# Patient Record
Sex: Female | Born: 1995
Health system: Southern US, Community
[De-identification: ages and names within clinical notes are randomized; demographics above are authoritative.]

## PROBLEM LIST (undated history)

## (undated) ENCOUNTER — Inpatient Hospital Stay (HOSPITAL_COMMUNITY): Payer: Self-pay

## (undated) DIAGNOSIS — U071 COVID-19: Secondary | ICD-10-CM

## (undated) DIAGNOSIS — L309 Dermatitis, unspecified: Secondary | ICD-10-CM

## (undated) DIAGNOSIS — G43909 Migraine, unspecified, not intractable, without status migrainosus: Secondary | ICD-10-CM

## (undated) HISTORY — DX: Dermatitis, unspecified: L30.9

## (undated) HISTORY — PX: NO PAST SURGERIES: SHX2092

---
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
## 2016-10-22 ENCOUNTER — Encounter (HOSPITAL_BASED_OUTPATIENT_CLINIC_OR_DEPARTMENT_OTHER): Payer: Self-pay

## 2016-10-22 ENCOUNTER — Emergency Department (HOSPITAL_BASED_OUTPATIENT_CLINIC_OR_DEPARTMENT_OTHER)
Admission: EM | Admit: 2016-10-22 | Discharge: 2016-10-22 | Disposition: A | Discharge status: Home / Self Care | Payer: 59 | Attending: Emergency Medicine | Admitting: Emergency Medicine

## 2016-10-22 ENCOUNTER — Emergency Department (HOSPITAL_BASED_OUTPATIENT_CLINIC_OR_DEPARTMENT_OTHER): Payer: 59

## 2016-10-22 DIAGNOSIS — F17200 Nicotine dependence, unspecified, uncomplicated: Secondary | ICD-10-CM | POA: Diagnosis not present

## 2016-10-22 DIAGNOSIS — R519 Headache, unspecified: Secondary | ICD-10-CM

## 2016-10-22 DIAGNOSIS — R51 Headache: Secondary | ICD-10-CM | POA: Insufficient documentation

## 2016-10-22 HISTORY — DX: Migraine, unspecified, not intractable, without status migrainosus: G43.909

## 2016-10-22 NOTE — ED Notes (Signed)
ED Provider at bedside discussing test results and dispo plan of care. 

## 2016-10-22 NOTE — ED Provider Notes (Signed)
[facility] Provider Note   CSN: [id number] Arrival date & time: 10/22/16  2129     History   Chief Complaint Chief Complaint  Patient presents with  . Migraine    HPI Melanie Cruz is a 21 y.o. female.  HPI Patients at 21 year old who reports persistent posterior headache over the past 3-4 weeks. She's never had headaches like this before. She's tried anti-inflammatories with some improvement but reports the headache persists. No visual changes. No weakness of her arms or legs. No recent injury or trauma. Denies neck pain. No fevers or chills. No recent illness symptoms are mild-to-moderate in severity. She is requesting a head CT   Past Medical History:  Diagnosis Date  . Migraine     There are no active problems to display for this patient.   History reviewed. No pertinent surgical history.  OB History    No data available       Home Medications    Prior to Admission medications   Not on File    Family History No family history on file.  Social History Social History  Substance Use Topics  . Smoking status: Current Every Day Smoker  . Smokeless tobacco: Never Used  . Alcohol use Yes     Comment: occ     Allergies   Patient has no known allergies.   Review of Systems Review of Systems  All other systems reviewed and are negative.    Physical Exam Updated Vital Signs BP 131/85 (BP Location: Left Arm)   Pulse 94   Temp 98.4 F (36.9 C) (Oral)   Resp 18   Ht 5\' 4"  (1.626 m)   Wt 87.6 kg (193 lb 2 oz)   LMP 10/10/2016   SpO2 100%   BMI 33.15 kg/m   Physical Exam  Constitutional: She is oriented to person, place, and time. She appears well-developed and well-nourished. No distress.  HENT:  Head: Normocephalic and atraumatic.  Eyes: Pupils are equal, round, and reactive to light. EOM are normal.  Neck: Normal range of motion.  Cardiovascular: Normal rate, regular rhythm and normal heart sounds.     Pulmonary/Chest: Effort normal and breath sounds normal.  Abdominal: Soft. She exhibits no distension. There is no tenderness.  Musculoskeletal: Normal range of motion.  Neurological: She is alert and oriented to person, place, and time.  5/5 strength in major muscle groups of  bilateral upper and lower extremities. Speech normal. No facial asymetry.   Skin: Skin is warm and dry.  Psychiatric: She has a normal mood and affect. Judgment normal.  Nursing note and vitals reviewed.    ED Treatments / Results  Labs (all labs ordered are listed, but only abnormal results are displayed) Labs Reviewed - No data to display  EKG  EKG Interpretation None       Radiology Ct Head Wo Contrast  Result Date: 10/22/2016 CLINICAL DATA:  Headache times 3-4 weeks. EXAM: CT HEAD WITHOUT CONTRAST TECHNIQUE: Contiguous axial images were obtained from the base of the skull through the vertex without intravenous contrast. COMPARISON:  Report from 02/19/2011 FINDINGS: Brain: No evidence of acute infarction, hemorrhage, hydrocephalus, extra-axial collection or mass lesion/mass effect. Vascular: No hyperdense vessel or unexpected calcification. Skull: Normal. Negative for fracture or focal lesion. Sinuses/Orbits: No acute finding. Other: None IMPRESSION: No acute intracranial abnormality. Electronically Signed   By: Ashley Royalty M.D.   On: 10/22/2016 22:40    Procedures Procedures (including critical care time)  Medications Ordered  in ED Medications - No data to display   Initial Impression / Assessment and Plan / ED Course  I have reviewed the triage vital signs and the nursing notes.  Pertinent labs & imaging results that were available during my care of the patient were reviewed by me and considered in my medical decision making (see chart for details).     Patient will need to have her eyes evaluated by an optometrist. I recommend that she try over-the-counter seasonal allergy medications.  Overall well appearing. Head CT negative. Discharge hematocrit condition.  Final Clinical Impressions(s) / ED Diagnoses   Final diagnoses:  Nonintractable headache, unspecified chronicity pattern, unspecified headache type    New Prescriptions New Prescriptions   No medications on file     Jola Schmidt, MD 10/22/16 2249

## 2016-10-22 NOTE — Discharge Instructions (Signed)
Please have your eyes checked by and optometrist  Take an over-the-counter medications such as Claritin for seasonal allergies

## 2016-10-22 NOTE — ED Triage Notes (Signed)
C/o migraine x 1 week-NAD-steady gait

## 2016-10-22 NOTE — ED Notes (Signed)
Patient transported to CT 

## 2017-01-03 ENCOUNTER — Telehealth: Payer: Self-pay

## 2017-01-03 NOTE — Telephone Encounter (Signed)
Please advise 

## 2017-01-03 NOTE — Telephone Encounter (Signed)
Copied from [place] [id number]. Topic: Appointment Scheduling - Scheduling Inquiry for Clinic >> Jan 02, 2017  6:27 PM Valla Leaver wrote: Reason for CRM: Patient would like to become a new patient of Dr. Etter Sjogren b/c she has appt's after 5pm. Please call patient to advise on whether or not she will be accepted. (New Pt-Establish Care/[employer]/[phone] (appt notes))

## 2017-01-06 NOTE — Telephone Encounter (Signed)
Called pt and LVM advising to call and schedule a new pt appt with a different provider.

## 2017-01-06 NOTE — Telephone Encounter (Signed)
Can you call patient to let her know and let her know of the other providers here that work after 5 sometimes that she can probably get an appointment with.

## 2017-01-06 NOTE — Telephone Encounter (Signed)
I'm not taking new pt at this time

## 2017-01-07 NOTE — L&D Delivery Note (Addendum)
Patient is 22 y.o. G1P0 [redacted]w[redacted]d admitted for SOL with occasional decelerations.   Delivery Note At 12:01 AM a viable female was delivered via Vaginal, Spontaneous (Presentation: cephalic, ROA ).  APGAR: 6, 9;   Placenta status: spontaneous, intact.  Cord: 3VC    Anesthesia:  Epidural Episiotomy: None Lacerations:  First degree left  Suture Repair: none Est. Blood Loss (mL): 500 mL  Mom to postpartum.  Baby to Couplet care / Skin to Skin.  Upon arrival patient was complete and pushing. She pushed with good maternal effort to deliver a healthy baby boy. Baby delivered following a left anterior shoulder dystocia that lasted a total of 40 seconds, McRoberts maneuver was employed along with suprapubic pressure, attempt was made to reduce the anterior shoulder without success, posterior should was successfully hooked and maneuvered forward which lead to the successful delivery of the baby boy. He was noted to have good tone and place on maternal abdomen for oral suctioning, drying and stimulation. Delayed cord clamping performed. Placenta delivered intact with 3V cord. Vaginal canal and perineum was inspected and found to have a hemostatic left sided vaginal wall laceration (first degree), no sutures required. Pitocin was started and uterus massaged until bleeding slowed. Counts of sharps, instruments, and lap pads were all correct.   Matilde Haymaker, MD PGY-1 9/3/201912:40 AM  I confirm that I have verified the information documented in the resident's note and that I have also personally reperformed the physical exam and all medical decision making activities.  I was gloved and present for entire delivery SVD without incident except for moderate shoulder dystocia Maneuvers were performed by me, including McRoberts, grasping axilla, delivering posterior shoulder.  Delivered in 30-40 seconds after head delivered.  Baby became vigorous after some drying and stimulation Lacerations as listed above Repair  not indicated Seabron Spates, CNM  Please schedule this patient for Postpartum visit in: 4 weeks with the following provider: Any provider For C/S patients schedule nurse incision check in weeks 2 weeks: no Low risk pregnancy complicated by: none Delivery mode:  SVD Anticipated Birth Control:  other/unsure PP Procedures needed: none  Schedule Integrated BH visit: no

## 2017-01-08 NOTE — Telephone Encounter (Signed)
Appt scheduled w/ Percell Miller- 01/16/2017.

## 2017-01-16 ENCOUNTER — Ambulatory Visit: Payer: 59 | Admitting: Medical

## 2017-01-30 ENCOUNTER — Ambulatory Visit (INDEPENDENT_AMBULATORY_CARE_PROVIDER_SITE_OTHER): Payer: 59 | Admitting: Medical

## 2017-01-30 ENCOUNTER — Encounter: Payer: Self-pay | Admitting: Medical

## 2017-01-30 VITALS — BP 117/61 | HR 85 | Temp 99.2°F | Resp 16 | Ht 64.0 in | Wt 202.2 lb

## 2017-01-30 DIAGNOSIS — H00014 Hordeolum externum left upper eyelid: Secondary | ICD-10-CM

## 2017-01-30 DIAGNOSIS — H1032 Unspecified acute conjunctivitis, left eye: Secondary | ICD-10-CM

## 2017-01-30 DIAGNOSIS — Z3A01 Less than 8 weeks gestation of pregnancy: Secondary | ICD-10-CM | POA: Diagnosis not present

## 2017-01-30 DIAGNOSIS — F17200 Nicotine dependence, unspecified, uncomplicated: Secondary | ICD-10-CM

## 2017-01-30 DIAGNOSIS — N926 Irregular menstruation, unspecified: Secondary | ICD-10-CM

## 2017-01-30 LAB — POCT URINE PREGNANCY: PREG TEST UR: POSITIVE — AB

## 2017-01-30 MED ORDER — PRENATAL 27-0.8 MG PO TABS: 1.0000 | ORAL_TABLET | Freq: Every day | ORAL | 11 refills | Status: DC

## 2017-01-30 MED ORDER — TOBRAMYCIN 0.3 % OP SOLN: 2.0000 [drp] | Freq: Four times a day (QID) | OPHTHALMIC | 0 refills | Status: DC

## 2017-01-30 MED FILL — PRENATAL VITAMIN PLUS LOW I: 27-1 | 30 days supply | Qty: 30 | Fill #0

## 2017-01-30 MED FILL — TOBRAMYCIN 0.3 % SOLN: 0.3 | 13 days supply | Qty: 5 | Fill #0

## 2017-01-30 NOTE — Patient Instructions (Addendum)
For your recent pregnancy, I put in a referral to obstetrician in our building.  You could also go down directly and let them know you are being referred.  Please have them look at that referral.  I went ahead and prescribed prenatal vitamin.  Please start this today.  You do appear to have left upper eyelid stye and signs/symptoms of conjunctivitis.  Use warm compress twice daily to the upper eyelid.  Can start Tobrex eyedrops.  Discussed with pharmacy and safe with pregnancy.  Please try to stop smoking.  Discussed smoking cessation options with obstetrician.  Follow-up with [place] as needed.  If your left eye or conjunctivitis persists please let us know and would refer you to optometrist or ophthalmologist.

## 2017-01-30 NOTE — Progress Notes (Signed)
Subjective:    Patient ID: Melanie Cruz, female    DOB: [birth year], 22 y.o.   MRN: [id number]  HPI  Pt in for first time.  She works at [employer]. Call center for [employer]. Graduate from [employer]. Graduated in accounting. Pt not exercising, Moderate healthy diet. Pt trying to quit smoking. 3 cigarettes a day. No alcohol. Single  Pt states 11 days ago found out pregnant. Last menses Dec 08, 2016. Pt has not established care with OB yet. She wants to establish care with ob/gyn in our building.  Pt states no nausea or vomiting except one time last week after drinking orange juice. No uti symptoms. No vaginal bleed.   Pt states she did find out she had bv at [facility] and told also pregnant. She took course of metronidazole. No bv type symptoms reported.   Pt does note some left eye redness the other day at work. Irritation and itching since Tuesday. Pt having some eye drainage and her lids are stuck together Upper lid some swelling.    Review of Systems  Constitutional: Negative for chills and fatigue.  Respiratory: Negative for cough, chest tightness and wheezing.   Cardiovascular: Negative for chest pain and palpitations.  Gastrointestinal: Negative for abdominal pain.  Endocrine: Negative for polydipsia, polyphagia and polyuria.  Genitourinary: Negative for difficulty urinating, enuresis, flank pain, frequency, hematuria, urgency, vaginal bleeding and vaginal discharge.  Musculoskeletal: Negative for back pain.  Skin: Negative for rash.  Neurological: Negative for dizziness, speech difficulty, weakness, numbness and headaches.  Hematological: Negative for adenopathy. Does not bruise/bleed easily.  Psychiatric/Behavioral: Negative for behavioral problems, confusion, dysphoric mood, self-injury and suicidal ideas. The patient is not nervous/anxious.      Past Medical History:  Diagnosis Date  . Migraine      Social History   Socioeconomic  History  . Marital status: Single    Spouse name: Not on file  . Number of children: Not on file  . Years of education: Not on file  . Highest education level: Not on file  Social Needs  . Financial resource strain: Not on file  . Food insecurity - worry: Not on file  . Food insecurity - inability: Not on file  . Transportation needs - medical: Not on file  . Transportation needs - non-medical: Not on file  Occupational History  . Not on file  Tobacco Use  . Smoking status: Current Every Day Smoker    Packs/day: 0.25    Years: 5.00    Pack years: 1.25  . Smokeless tobacco: Never Used  Substance and Sexual Activity  . Alcohol use: Yes    Comment: occasional in past but not since becoming pregnant.  . Drug use: No  . Sexual activity: Yes    Birth control/protection: None  Other Topics Concern  . Not on file  Social History Narrative  . Not on file    History reviewed. No pertinent surgical history.  Family History  Problem Relation Age of Onset  . Hypertension Mother     No Known Allergies  No current outpatient medications on file prior to visit.   No current facility-administered medications on file prior to visit.     BP 117/61   Pulse 85   Temp 99.2 F (37.3 C) (Oral)   Resp 16   Ht 5\' 4"  (1.626 m)   Wt 202 lb 3.2 oz (91.7 kg)   LMP 12/08/2016   SpO2 100%   BMI  34.71 kg/m       Objective:   Physical Exam  General  Mental Status - Alert. General Appearance - Well groomed. Not in acute distress.  Skin Rashes- No Rashes.  HEENT Head- Normal. Ear Auditory Canal - Left- Normal. Right - Normal.Tympanic Membrane- Left- Normal. Right- Normal. Eye Sclera/Conjunctiva- Left- conjunctiva mild injectedl. Right- Normal.(left upper eye lid. Mid region style on palpation mid lash region) Nose & Sinuses Nasal Mucosa- Left-  Boggy and Congested. Right-  Boggy and  Congested.Bilateral no maxillary and no  frontal sinus pressure. Mouth & Throat Lips: Upper  Lip- Normal: no dryness, cracking, pallor, cyanosis, or vesicular eruption. Lower Lip-Normal: no dryness, cracking, pallor, cyanosis or vesicular eruption. Buccal Mucosa- Bilateral- No Aphthous ulcers. Oropharynx- No Discharge or Erythema. Tonsils: Characteristics- Bilateral- No Erythema or Congestion. Size/Enlargement- Bilateral- No enlargement. Discharge- bilateral-None.  Neck Neck- Supple. No Masses.   Chest and Lung Exam Auscultation: Breath Sounds:-Clear even and unlabored.  Cardiovascular Auscultation:Rythm- Regular, rate and rhythm. Murmurs & Other Heart Sounds:Ausculatation of the heart reveal- No Murmurs.  Lymphatic Head & Neck General Head & Neck Lymphatics: Bilateral: Description- No Localized lymphadenopathy.   Abdomen- soft, non-tender, nondistended. +bs,no rebound or guarding. Back- no cva tenderness.        Assessment & Plan:  For your recent pregnancy, I put in a referral to obstetrician in our building.  You could also go down directly and let them know you are being referred.  Please have them look at that referral.  I went ahead and prescribed prenatal vitamin.  Please start this today.  You do appear to have left upper eyelid stye and signs/symptoms of conjunctivitis.  Use warm compress twice daily to the upper eyelid.  Can start Tobrex eyedrops.  Discussed with pharmacy and safe with pregnancy.  Please try to stop smoking.  Discussed smoking cessation options with obstetrician.  Follow-up with [place] as needed.  If your left eye or conjunctivitis persists please let us know and would refer you to optometrist or ophthalmologist.  Mackie Pai, PA-C

## 2017-02-13 ENCOUNTER — Encounter: Payer: Self-pay | Admitting: Family Medicine

## 2017-02-13 ENCOUNTER — Ambulatory Visit (INDEPENDENT_AMBULATORY_CARE_PROVIDER_SITE_OTHER): Payer: 59 | Admitting: Family Medicine

## 2017-02-13 DIAGNOSIS — Z3687 Encounter for antenatal screening for uncertain dates: Secondary | ICD-10-CM | POA: Diagnosis not present

## 2017-02-13 DIAGNOSIS — Z124 Encounter for screening for malignant neoplasm of cervix: Secondary | ICD-10-CM

## 2017-02-13 DIAGNOSIS — Z3401 Encounter for supervision of normal first pregnancy, first trimester: Secondary | ICD-10-CM

## 2017-02-13 DIAGNOSIS — Z113 Encounter for screening for infections with a predominantly sexual mode of transmission: Secondary | ICD-10-CM | POA: Diagnosis not present

## 2017-02-13 DIAGNOSIS — Z3400 Encounter for supervision of normal first pregnancy, unspecified trimester: Secondary | ICD-10-CM | POA: Insufficient documentation

## 2017-02-13 MED ORDER — NYSTATIN 100000 UNIT/GM EX CREA: 1.0000 "application " | TOPICAL_CREAM | Freq: Two times a day (BID) | CUTANEOUS | 1 refills | Status: DC

## 2017-02-13 NOTE — Progress Notes (Signed)
DATING AND VIABILITY SONOGRAM   Melanie Cruz is a 22 y.o. year old G1P0 with LMP Patient's last menstrual period was 12/08/2016. which would correlate to  [redacted]w[redacted]d weeks gestation.  She has regular menstrual cycles.   She is here today for a confirmatory initial sonogram.    GESTATION: [place]     FETAL ACTIVITY:          Heart rate         164          The fetus is active.   GESTATIONAL AGE AND  BIOMETRICS:   Gestational criteria: Estimated Date of Delivery: 09/14/17 by LMP now at [redacted]w[redacted]d  Previous Scans:0      CROWN RUMP LENGTH           2.25cm         8.6 weeks                                                  AVERAGE EGA(BY THIS SCAN):  8.6 weeks  WORKING EDD( LMP ):  09/14/2017      Melanie Cruz 02/13/2017 11:22 AM

## 2017-02-13 NOTE — Progress Notes (Signed)
  Subjective:  Melanie Cruz is a G1P0 [redacted]w[redacted]d being seen today for her first obstetrical visit.  Her obstetrical history is significant for obesity. This was an unplanned, yet desired. Patient not sure regarding breast feeding. Pregnancy history fully reviewed.  Patient reports no complaints.  BP 118/67   Pulse 82   Wt 205 lb (93 kg)   LMP 12/08/2016   BMI 35.19 kg/m   HISTORY: OB History  Gravida Para Term Preterm AB Living  1            SAB TAB Ectopic Multiple Live Births               # Outcome Date GA Lbr Len/2nd Weight Sex Delivery Anes PTL Lv  1 Current               Past Medical History:  Diagnosis Date  . Migraine     History reviewed. No pertinent surgical history.  Family History  Problem Relation Age of Onset  . Hypertension Mother      Exam    Uterus:     Pelvic Exam:    Perineum: No Hemorrhoids, Normal Perineum   Vulva: normal, Bartholin's, Urethra, Skene's normal   Vagina:  normal mucosa   Cervix: no bleeding following Pap, no lesions and nulliparous appearance   Adnexa: normal adnexa and no mass, fullness, tenderness   Bony Pelvis: gynecoid  System: Breast:  normal appearance, no masses or tenderness, Inspection negative, No nipple retraction or dimpling, No nipple discharge or bleeding, No axillary or supraclavicular adenopathy, Normal to palpation without dominant masses   Skin: normal coloration and turgor, no rashes    Neurologic: gait normal; reflexes normal and symmetric   Extremities: normal strength, tone, and muscle mass, no deformities, no evidence of joint effusion   HEENT PERRLA and extra ocular movement intact   Mouth/Teeth mucous membranes moist, pharynx normal without lesions   Neck supple and no masses   Cardiovascular: regular rate and rhythm, no murmurs or gallops   Respiratory:  appears well, vitals normal, no respiratory distress, acyanotic, normal RR, ear and throat exam is normal, neck free of mass or lymphadenopathy, chest  clear, no wheezing, crepitations, rhonchi, normal symmetric air entry   Abdomen: soft, non-tender; bowel sounds normal; no masses,  no organomegaly   Urinary: urethral meatus normal      Assessment:    Pregnancy: G1P0 Patient Active Problem List   Diagnosis Date Noted  . Supervision of normal first pregnancy, antepartum 02/13/2017      Plan:   1. Supervision of normal first pregnancy, antepartum Genetic Screening discussed Quad Screen: requested.  Ultrasound discussed; fetal survey: requested.  Follow up in 4 weeks. Discussed nature of practice, delivery at [facility], use of midwives, fellows, residents.  - Culture, OB Urine - Obstetric Panel, Including HIV - Cytology - PAP - Hemoglobinopathy evaluation     Problem list reviewed and updated. 75% of 30 min visit spent on counseling and coordination of care.    Truett Mainland 02/13/2017

## 2017-02-17 ENCOUNTER — Encounter: Payer: Self-pay | Admitting: Family Medicine

## 2017-02-17 DIAGNOSIS — O9989 Other specified diseases and conditions complicating pregnancy, childbirth and the puerperium: Secondary | ICD-10-CM

## 2017-02-17 DIAGNOSIS — Z2839 Other underimmunization status: Secondary | ICD-10-CM | POA: Insufficient documentation

## 2017-02-17 DIAGNOSIS — Z283 Underimmunization status: Secondary | ICD-10-CM | POA: Insufficient documentation

## 2017-02-17 LAB — HEMOGLOBINOPATHY EVALUATION
HEMOGLOBIN A2 QUANTITATION: 2 % (ref 1.8–3.2)
HEMOGLOBIN F QUANTITATION: 0 % (ref 0.0–2.0)
HGB A: 98 % (ref 96.4–98.8)
HGB C: 0 %
HGB S: 0 %
HGB VARIANT: 0 %

## 2017-02-17 LAB — OBSTETRIC PANEL, INCLUDING HIV
Antibody Screen: NEGATIVE
BASOS ABS: 0 10*3/uL (ref 0.0–0.2)
BASOS: 0 %
EOS (ABSOLUTE): 0.1 10*3/uL (ref 0.0–0.4)
Eos: 1 %
HEMATOCRIT: 36.7 % (ref 34.0–46.6)
HIV SCREEN 4TH GENERATION: NONREACTIVE
Hemoglobin: 12 g/dL (ref 11.1–15.9)
Hepatitis B Surface Ag: NEGATIVE
Immature Grans (Abs): 0 10*3/uL (ref 0.0–0.1)
Immature Granulocytes: 0 %
LYMPHS ABS: 2.2 10*3/uL (ref 0.7–3.1)
Lymphs: 24 %
MCH: 26.7 pg (ref 26.6–33.0)
MCHC: 32.7 g/dL (ref 31.5–35.7)
MCV: 82 fL (ref 79–97)
MONOCYTES: 9 %
Monocytes Absolute: 0.8 10*3/uL (ref 0.1–0.9)
NEUTROS ABS: 5.9 10*3/uL (ref 1.4–7.0)
Neutrophils: 66 %
PLATELETS: 238 10*3/uL (ref 150–379)
RBC: 4.49 x10E6/uL (ref 3.77–5.28)
RDW: 14.5 % (ref 12.3–15.4)
RPR Ser Ql: NONREACTIVE
RUBELLA: 0.96 {index} — AB (ref >0.99)
Rh Factor: POSITIVE
WBC: 9 10*3/uL (ref 3.4–10.8)

## 2017-02-17 LAB — CYTOLOGY - PAP
Chlamydia: NEGATIVE
Diagnosis: NEGATIVE
NEISSERIA GONORRHEA: NEGATIVE

## 2017-02-17 LAB — URINE CULTURE, OB REFLEX

## 2017-02-17 LAB — CULTURE, OB URINE

## 2017-03-13 ENCOUNTER — Ambulatory Visit (INDEPENDENT_AMBULATORY_CARE_PROVIDER_SITE_OTHER): Payer: 59 | Admitting: Family Medicine

## 2017-03-13 VITALS — BP 123/74 | HR 73 | Wt 203.0 lb

## 2017-03-13 DIAGNOSIS — O09899 Supervision of other high risk pregnancies, unspecified trimester: Secondary | ICD-10-CM

## 2017-03-13 DIAGNOSIS — Z2839 Other underimmunization status: Secondary | ICD-10-CM

## 2017-03-13 DIAGNOSIS — Z3400 Encounter for supervision of normal first pregnancy, unspecified trimester: Secondary | ICD-10-CM

## 2017-03-13 DIAGNOSIS — Z716 Tobacco abuse counseling: Secondary | ICD-10-CM | POA: Insufficient documentation

## 2017-03-13 DIAGNOSIS — R8271 Bacteriuria: Secondary | ICD-10-CM

## 2017-03-13 DIAGNOSIS — O9989 Other specified diseases and conditions complicating pregnancy, childbirth and the puerperium: Secondary | ICD-10-CM

## 2017-03-13 DIAGNOSIS — Z283 Underimmunization status: Secondary | ICD-10-CM

## 2017-03-13 MED ORDER — NICOTINE 7 MG/24HR TD PT24: 7.0000 mg | MEDICATED_PATCH | Freq: Every day | TRANSDERMAL | 0 refills | Status: DC

## 2017-03-13 MED FILL — NICOTINE 7 MG/24HR PATCH: 7 | 28 days supply | Qty: 28 | Fill #0

## 2017-03-13 NOTE — Progress Notes (Addendum)
   PRENATAL VISIT NOTE  Subjective:  Melanie Cruz is a 22 y.o. G1P0 at [redacted]w[redacted]d being seen today for ongoing prenatal care.  She is currently monitored for the following issues for this low-risk pregnancy and has Supervision of normal first pregnancy, antepartum and Rubella non-immune status, antepartum on their problem list.  Patient reports no complaints.  Contractions: Not present. Vag. Bleeding: None.   . Denies leaking of fluid.   The following portions of the patient's history were reviewed and updated as appropriate: allergies, current medications, past family history, past medical history, past social history, past surgical history and problem list. Problem list updated.  Objective:   Vitals:   03/13/17 1012  BP: 123/74  Pulse: 73  Weight: 203 lb (92.1 kg)    Fetal Status: Fetal Heart Rate (bpm): 158          General:  Alert, oriented and cooperative. Patient is in no acute distress.  Skin: Skin is warm and dry. No rash noted.   Cardiovascular: Normal heart rate noted  Respiratory: Normal respiratory effort, no problems with respiration noted  Abdomen: Soft, gravid, appropriate for gestational age.  Pain/Pressure: Absent     Pelvic: Cervical exam deferred        Extremities: Normal range of motion.  Edema: None  Mental Status:  Normal mood and affect. Normal behavior. Normal judgment and thought content.   Assessment and Plan:  Pregnancy: G1P0 at [redacted]w[redacted]d  1. Supervision of normal first pregnancy, antepartum FHT and FH normal - [place] MFM OB COMP + 14 WK; Future  2. GBS bacteriuria Intrapartum prophylaxis  3. Tobacco abuse counseling nicoderm patch prescribed. Discussed habit replacement   4. Rubella non-immune status, antepartum Equivocal - just below cutoff. Will repeat at 28 week labs.   Preterm labor symptoms and general obstetric precautions including but not limited to vaginal bleeding, contractions, leaking of fluid and fetal movement were reviewed in detail with  the patient. Please refer to After Visit Summary for other counseling recommendations.  Return in about 4 weeks (around 04/10/2017).   Truett Mainland, DO

## 2017-03-13 NOTE — Addendum Note (Signed)
Addended by: Truett Mainland on: 03/13/2017 10:46 AM   Modules accepted: Orders

## 2017-03-15 ENCOUNTER — Emergency Department (HOSPITAL_BASED_OUTPATIENT_CLINIC_OR_DEPARTMENT_OTHER): Payer: 59

## 2017-03-15 ENCOUNTER — Emergency Department (HOSPITAL_BASED_OUTPATIENT_CLINIC_OR_DEPARTMENT_OTHER)
Admission: EM | Admit: 2017-03-15 | Discharge: 2017-03-15 | Disposition: A | Discharge status: Home / Self Care | Payer: 59 | Attending: Emergency Medicine | Admitting: Emergency Medicine

## 2017-03-15 ENCOUNTER — Encounter (HOSPITAL_BASED_OUTPATIENT_CLINIC_OR_DEPARTMENT_OTHER): Payer: Self-pay | Admitting: Emergency Medicine

## 2017-03-15 ENCOUNTER — Other Ambulatory Visit: Payer: Self-pay

## 2017-03-15 DIAGNOSIS — Z79899 Other long term (current) drug therapy: Secondary | ICD-10-CM | POA: Diagnosis not present

## 2017-03-15 DIAGNOSIS — W25XXXA Contact with sharp glass, initial encounter: Secondary | ICD-10-CM | POA: Diagnosis not present

## 2017-03-15 DIAGNOSIS — Y939 Activity, unspecified: Secondary | ICD-10-CM | POA: Insufficient documentation

## 2017-03-15 DIAGNOSIS — Z3A13 13 weeks gestation of pregnancy: Secondary | ICD-10-CM | POA: Insufficient documentation

## 2017-03-15 DIAGNOSIS — Z23 Encounter for immunization: Secondary | ICD-10-CM | POA: Diagnosis not present

## 2017-03-15 DIAGNOSIS — O99331 Smoking (tobacco) complicating pregnancy, first trimester: Secondary | ICD-10-CM | POA: Diagnosis not present

## 2017-03-15 DIAGNOSIS — O9A211 Injury, poisoning and certain other consequences of external causes complicating pregnancy, first trimester: Secondary | ICD-10-CM | POA: Insufficient documentation

## 2017-03-15 DIAGNOSIS — S61217A Laceration without foreign body of left little finger without damage to nail, initial encounter: Secondary | ICD-10-CM | POA: Diagnosis not present

## 2017-03-15 DIAGNOSIS — Y929 Unspecified place or not applicable: Secondary | ICD-10-CM | POA: Diagnosis not present

## 2017-03-15 DIAGNOSIS — Y999 Unspecified external cause status: Secondary | ICD-10-CM | POA: Diagnosis not present

## 2017-03-15 DIAGNOSIS — F17200 Nicotine dependence, unspecified, uncomplicated: Secondary | ICD-10-CM | POA: Insufficient documentation

## 2017-03-15 MED ORDER — ONDANSETRON 4 MG PO TBDP
4.0000 mg | ORAL_TABLET | Freq: Once | ORAL | Status: AC
  Administered 2017-03-15: 4 mg via ORAL
  Filled 2017-03-15: qty 1

## 2017-03-15 MED ORDER — LIDOCAINE HCL (PF) 1 % IJ SOLN
5.0000 mL | Freq: Once | INTRAMUSCULAR | Status: AC
  Administered 2017-03-15: 5 mL
  Filled 2017-03-15: qty 5

## 2017-03-15 MED ORDER — TETANUS-DIPHTH-ACELL PERTUSSIS 5-2.5-18.5 LF-MCG/0.5 IM SUSP
0.5000 mL | Freq: Once | INTRAMUSCULAR | Status: AC
Start: 2017-03-15 — End: 2017-03-15
  Administered 2017-03-15: 0.5 mL via INTRAMUSCULAR
  Filled 2017-03-15: qty 0.5

## 2017-03-15 NOTE — ED Notes (Signed)
ED Provider at bedside. 

## 2017-03-15 NOTE — ED Triage Notes (Signed)
Pt states she punched a window today. Puncture wounds and laceration noted to ring and pinky finger of L hand.

## 2017-03-15 NOTE — ED Provider Notes (Signed)
[facility] EMERGENCY DEPARTMENT Provider Note   CSN: [id number] Arrival date & time: 03/15/17  1447     History   Chief Complaint Chief Complaint  Patient presents with  . Hand Injury    HPI Majorie Santee is a 22 y.o. female with history of migraine who presents with left hand pain and laceration after punching a window out of anger.  She is right-handed.  Bleeding controlled prior to arrival.  She has pain around the lacerations on her fourth and fifth digits, no other significant pain in her hand.  She denies any other injuries.  She is [redacted] weeks pregnant.  Her tetanus is not up-to-date.  HPI  Past Medical History:  Diagnosis Date  . Migraine     Patient Active Problem List   Diagnosis Date Noted  . GBS bacteriuria 03/13/2017  . Tobacco abuse counseling 03/13/2017  . Rubella non-immune status, antepartum 02/17/2017  . Supervision of normal first pregnancy, antepartum 02/13/2017    History reviewed. No pertinent surgical history.  OB History    Gravida Para Term Preterm AB Living   1             SAB TAB Ectopic Multiple Live Births                   Home Medications    Prior to Admission medications   Medication Sig Start Date End Date Taking? Authorizing Provider  nicotine (NICODERM CQ) 7 mg/24hr patch Place 1 patch (7 mg total) onto the skin daily. 03/13/17   Truett Mainland, DO  Prenatal Vit-Fe Fumarate-FA (MULTIVITAMIN-PRENATAL) 27-0.8 MG TABS tablet Take 1 tablet by mouth daily at 12 noon. Or equivalent. 01/30/17   Saguier, Percell Miller, PA-C  tobramycin (TOBREX) 0.3 % ophthalmic solution Place 2 drops into the left eye every 6 (six) hours. 01/30/17   Saguier, Percell Miller, PA-C    Family History Family History  Problem Relation Age of Onset  . Hypertension Mother     Social History Social History   Tobacco Use  . Smoking status: Current Every Day Smoker    Packs/day: 0.25    Years: 5.00    Pack years: 1.25  . Smokeless tobacco: Never Used  .  Tobacco comment: 3 cigs/day  Substance Use Topics  . Alcohol use: Yes    Comment: occasional in past but not since becoming pregnant.  . Drug use: No     Allergies   Patient has no known allergies.   Review of Systems Review of Systems  Constitutional: Negative for fever.  Respiratory: Negative for shortness of breath.   Cardiovascular: Negative for chest pain.  Gastrointestinal: Negative for abdominal pain, nausea and vomiting.  Genitourinary: Negative for vaginal bleeding and vaginal discharge.  Musculoskeletal: Positive for arthralgias (lacerations over digit joints).  Skin: Positive for wound.  Neurological: Negative for numbness.     Physical Exam Updated Vital Signs BP 111/70 (BP Location: Right Arm)   Pulse 79   Temp 99.2 F (37.3 C) (Oral)   Resp 19   Ht 5\' 4"  (1.626 m)   Wt 94.8 kg (209 lb)   LMP 12/08/2016   SpO2 100%   BMI 35.87 kg/m   Physical Exam  Constitutional: She appears well-developed and well-nourished. No distress.  HENT:  Head: Normocephalic and atraumatic.  Mouth/Throat: Oropharynx is clear and moist. No oropharyngeal exudate.  Eyes: Conjunctivae are normal. Pupils are equal, round, and reactive to light. Right eye exhibits no discharge. Left eye exhibits no discharge.  No scleral icterus.  Neck: Normal range of motion. Neck supple. No thyromegaly present.  Cardiovascular: Normal rate, regular rhythm, normal heart sounds and intact distal pulses. Exam reveals no gallop and no friction rub.  No murmur heard. Pulmonary/Chest: Effort normal and breath sounds normal. No stridor. No respiratory distress. She has no wheezes. She has no rales.  Abdominal: Soft. Bowel sounds are normal. She exhibits no distension. There is no tenderness. There is no rebound and no guarding.  Musculoskeletal: She exhibits no edema.  L hand: 1cm wide x 1cm long laceration with tissue loss over PIP of 4th digit; 2.5 cam laceration with flap to 5th digit between PIP and  MCP; no visible tendon or bone, only adipose Full range of motion with flexion, extension, abduction, abduction of all digits; no bony tenderness throughout the hand or wrist; sensation intact  Lymphadenopathy:    She has no cervical adenopathy.  Neurological: She is alert. Coordination normal.  Skin: Skin is warm and dry. No rash noted. She is not diaphoretic. No pallor.  Psychiatric: She has a normal mood and affect.  Nursing note and vitals reviewed.    ED Treatments / Results  Labs (all labs ordered are listed, but only abnormal results are displayed) Labs Reviewed - No data to display  EKG  EKG Interpretation None       Radiology Dg Hand Complete Left  Result Date: 03/15/2017 CLINICAL DATA:  Punched window with left hand. Puncture wounds and lacerations noted to ring and fifth finger. EXAM: LEFT HAND - COMPLETE 3+ VIEW COMPARISON:  None. FINDINGS: No osseous fracture or dislocation. No radiodense foreign body appreciated within the surrounding soft tissues. Overlying bandages in place. IMPRESSION: Negative. Electronically Signed   By: Franki Cabot M.D.   On: 03/15/2017 15:15    Procedures .Marland KitchenLaceration Repair Date/Time: 03/15/2017 6:12 PM Performed by: Frederica Kuster, PA-C Authorized by: Frederica Kuster, PA-C   Consent:    Consent obtained:  Verbal   Consent given by:  Patient   Risks discussed:  Infection, pain and poor cosmetic result   Alternatives discussed:  No treatment Anesthesia (see MAR for exact dosages):    Anesthesia method:  Nerve block   Block location:  L 5th digit   Block needle gauge:  25 G   Block anesthetic:  Lidocaine 1% w/o epi   Block technique:  Digital block   Block injection procedure:  Anatomic landmarks identified, introduced needle, negative aspiration for blood, incremental injection and anatomic landmarks palpated   Block outcome:  Anesthesia achieved Laceration details:    Location:  Finger   Finger location:  L small finger    Length (cm):  2.5   Depth (mm):  3 Repair type:    Repair type:  Simple Pre-procedure details:    Preparation:  Patient was prepped and draped in usual sterile fashion and imaging obtained to evaluate for foreign bodies Exploration:    Hemostasis achieved with:  Direct pressure   Wound exploration: wound explored through full range of motion and entire depth of wound probed and visualized     Wound extent: no foreign bodies/material noted, no muscle damage noted and no tendon damage noted     Contaminated: no   Treatment:    Area cleansed with:  Saline   Amount of cleaning:  Standard   Irrigation solution:  Sterile saline   Irrigation volume:  250   Irrigation method:  Syringe   Visualized foreign bodies/material removed: no   Skin  repair:    Repair method:  Sutures   Suture size:  5-0   Wound skin closure material used: Ethilon.   Suture technique:  Simple interrupted   Number of sutures:  6 Approximation:    Approximation:  Close Post-procedure details:    Dressing:  Antibiotic ointment, splint for protection and sterile dressing   Patient tolerance of procedure:  Tolerated well, no immediate complications   (including critical care time)  Medications Ordered in ED Medications  lidocaine (PF) (XYLOCAINE) 1 % injection 5 mL (5 mLs Infiltration Given 03/15/17 1616)  Tdap (BOOSTRIX) injection 0.5 mL (0.5 mLs Intramuscular Given 03/15/17 1607)  ondansetron (ZOFRAN-ODT) disintegrating tablet 4 mg (4 mg Oral Given 03/15/17 1717)     Initial Impression / Assessment and Plan / ED Course  I have reviewed the triage vital signs and the nursing notes.  Pertinent labs & imaging results that were available during my care of the patient were reviewed by me and considered in my medical decision making (see chart for details).     Patient with V-shaped laceration to left fifth digits and laceration/abrasion with tissue loss to left fourth digit.  Laceration to the fifth digit repaired as  above.  Splinted for protection of sutures.  Left fourth left to heal by secondary intention with dressing changes. Tetanus updated in ED. Laceration occurred < 12 hours prior to repair. Discussed laceration care with pt and answered questions. Pt to f-u for suture removal in 10 days and wound check sooner should there be signs of dehiscence or infection.  Patient understands and agrees with plan.  Patient vitals stable throughout ED course and discharged in satisfactory condition.  Final Clinical Impressions(s) / ED Diagnoses   Final diagnoses:  Laceration of left little finger without foreign body without damage to nail, initial encounter    ED Discharge Orders    None       Frederica Kuster, PA-C 03/15/17 1815    Sherwood Gambler, MD 03/16/17 2330

## 2017-03-15 NOTE — ED Notes (Signed)
Bacitracin and dry dressing applied to site as ordered.

## 2017-03-15 NOTE — Discharge Instructions (Signed)
Treatment: Keep your wounds dry and dressing applied until this time tomorrow. After 24 hours, you may wash with warm soapy water. Dry and apply antibiotic ointment and clean dressing. Do this daily until your sutures are removed. Wear your splint for the first 5 days to keep sutures from pulling out when you bend your finger.  Follow-up: Please follow-up with your primary care provider or return to emergency department in 10 days for suture removal. Be aware of signs of infection: fever, increasing pain, redness, swelling, drainage from the area. Please call your primary care provider or return to emergency department if you develop any of these symptoms or if any of the sutures come out prior to removal. Please return to the emergency department if you develop any other new or worsening symptoms.

## 2017-03-20 ENCOUNTER — Ambulatory Visit (INDEPENDENT_AMBULATORY_CARE_PROVIDER_SITE_OTHER): Payer: 59 | Admitting: Medical

## 2017-03-20 ENCOUNTER — Encounter: Payer: Self-pay | Admitting: Medical

## 2017-03-20 VITALS — BP 119/70 | HR 95 | Resp 16 | Ht 64.0 in | Wt 206.0 lb

## 2017-03-20 DIAGNOSIS — L0291 Cutaneous abscess, unspecified: Secondary | ICD-10-CM

## 2017-03-20 NOTE — Patient Instructions (Addendum)
For your left upper thigh large abscess, I want you to refer you to surgeon. I want you to be seen today or tomorrow at the latest.   I am not prescribing antibiotics presently. Will defer decision on which antibiotic to surgeon.  Follow up with me on Tuesday or with ED for suture removal. On follow range of motion needs to be determined. If poor range of motion then may need specilaist referral to see if tendon injured.  We called local surgeon and they stated abscess above 5 mm they would not see. In that event stated needed ED evaluation.  Area measures 4 cm x 6 cm. I am asking staff  to find local surgeon that will see you today or tomorrow.  We will let you know by tomorrow morning.

## 2017-03-20 NOTE — Progress Notes (Addendum)
Subjective:    Patient ID: Melanie Cruz, female    DOB: 1995/09/28, 22 y.o.   MRN: [id number]  HPI  Pt in with some left inner thigh pain. Noticed this for 2 days. She states area is getting larger and tender.  Pt states in past she has had bump like this before about 2 years. This was in inguinal fold region. Required an I and D then.    Pt also had suture placed her left 5th digit in ED recently.Marland Kitchen She was told to follow up in 10 days for suture removal.   Review of Systems  Constitutional: Negative for chills, fatigue and fever.  Respiratory: Negative for cough and chest tightness.   Cardiovascular: Negative for chest pain and palpitations.  Genitourinary:       Pt is pregnant. Being followed by OB.  Musculoskeletal: Negative for back pain, gait problem and joint swelling.  Neurological: Negative for dizziness, speech difficulty and weakness.  Hematological: Negative for adenopathy. Does not bruise/bleed easily.  Psychiatric/Behavioral: Negative for behavioral problems and confusion.    Past Medical History:  Diagnosis Date  . Migraine      Social History   Socioeconomic History  . Marital status: Single    Spouse name: Not on file  . Number of children: Not on file  . Years of education: Not on file  . Highest education level: Not on file  Social Needs  . Financial resource strain: Not on file  . Food insecurity - worry: Not on file  . Food insecurity - inability: Not on file  . Transportation needs - medical: Not on file  . Transportation needs - non-medical: Not on file  Occupational History  . Not on file  Tobacco Use  . Smoking status: Current Every Day Smoker    Packs/day: 0.25    Years: 5.00    Pack years: 1.25  . Smokeless tobacco: Never Used  . Tobacco comment: 3 cigs/day  Substance and Sexual Activity  . Alcohol use: Yes    Comment: occasional in past but not since becoming pregnant.  . Drug use: No  . Sexual activity: Yes    Birth  control/protection: None  Other Topics Concern  . Not on file  Social History Narrative  . Not on file    No past surgical history on file.  Family History  Problem Relation Age of Onset  . Hypertension Mother     No Known Allergies  Current Outpatient Medications on File Prior to Visit  Medication Sig Dispense Refill  . Prenatal Vit-Fe Fumarate-FA (MULTIVITAMIN-PRENATAL) 27-0.8 MG TABS tablet Take 1 tablet by mouth daily at 12 noon. Or equivalent. 30 each 11  . nicotine (NICODERM CQ) 7 mg/24hr patch Place 1 patch (7 mg total) onto the skin daily. (Patient not taking: Reported on 03/20/2017) 28 patch 0   No current facility-administered medications on file prior to visit.     BP 119/70 (BP Location: Right Arm, Patient Position: Sitting, Cuff Size: Large)   Pulse 95   Resp 16   Ht 5\' 4"  (1.626 m)   Wt 206 lb (93.4 kg)   LMP 12/08/2016   SpO2 100%   BMI 35.36 kg/m       Objective:   Physical Exam  General- No acute distress. Pleasant patient. Neck- Full range of motion, no jvd Lungs- Clear, even and unlabored. Heart- regular rate and rhythm. Neurologic- CNII- XII grossly intact.   Left upper thigh- below groin area has 4 cm x  6 cm area of cellulitis vs abscess(more abscess like). Depth estimated at 3 cm.(approximation)   Lt hand- dorsal aspect of 5th digit has v shaped laceration behind pip area(finger in splint). 4th digit has abrasion area over pip joint dorsal aspect.About 3-4 mm wide circular shaped.Pt can partially flex finger.     Assessment & Plan:   For your left upper thigh large abscess, I want you to refer you to surgeon. I want you to be seen today or tomorrow at the latest.   I am not prescribing antibiotics presently. Will defer decision on which antibiotic to surgeon.  Follow up with me on Tuesday or with ED for suture removal. On follow range of motion needs to be determined. If poor range of motion then may need specilaist referral to see if  tendon injured.  We called local surgeon and they stated abscess above 5 mm they would not see. In that event stated needed ED evaluation.  Area measures 4 cm x 6 cm. I am asking staff to find local surgeon that will see you today or tomorrow.  We will let you know by tomorrow morning.   Mackie Pai, PA-C

## 2017-03-26 ENCOUNTER — Ambulatory Visit (INDEPENDENT_AMBULATORY_CARE_PROVIDER_SITE_OTHER): Payer: 59 | Admitting: Medical

## 2017-03-26 ENCOUNTER — Encounter: Payer: Self-pay | Admitting: Medical

## 2017-03-26 VITALS — BP 102/55 | HR 76 | Temp 98.2°F | Resp 16 | Ht 64.0 in | Wt 213.0 lb

## 2017-03-26 DIAGNOSIS — M25642 Stiffness of left hand, not elsewhere classified: Secondary | ICD-10-CM | POA: Diagnosis not present

## 2017-03-26 DIAGNOSIS — Z4802 Encounter for removal of sutures: Secondary | ICD-10-CM

## 2017-03-26 NOTE — Patient Instructions (Signed)
Suture all removed successfully. I do think placing coban over suture site for next 2 days is good idea. Removed sutures little early since they were beginning to get buried in wound. After two days coban not needed.  Will refer to sports medicine to evaluate your 4th digit decreased flexion.  Follow up as needed

## 2017-03-26 NOTE — Progress Notes (Signed)
Subjective:    Patient ID: Melanie Cruz, female    DOB: Jan 16, 1995, 22 y.o.   MRN: [id number]  HPI  Pt in for follow up on left ring finger. No reported signs or symptoms of infection. Last visit wound looked like healing fast and advised come back today for suture removal.  Pt left thigh abscess ruptured spontaneously before she saw general surgeon office. They did check area. Area does feel better.  Pt still has left hand 3rd digit decreased range of motion. Pt wound at dorsal pip joint healing by secondary intention.      Review of Systems  Constitutional: Negative for chills, fatigue and fever.  HENT: Negative for congestion, ear pain, mouth sores, rhinorrhea, sinus pressure and sneezing.   Respiratory: Negative for apnea, cough and choking.   Cardiovascular: Negative for chest pain and palpitations.  Gastrointestinal: Negative for abdominal pain.  Musculoskeletal:       See hpi.  Skin: Negative for rash.  Neurological: Negative for dizziness and light-headedness.  Hematological: Negative for adenopathy. Does not bruise/bleed easily.  Psychiatric/Behavioral: Negative for behavioral problems and confusion. The patient is not nervous/anxious.     Past Medical History:  Diagnosis Date  . Migraine      Social History   Socioeconomic History  . Marital status: Single    Spouse name: Not on file  . Number of children: Not on file  . Years of education: Not on file  . Highest education level: Not on file  Social Needs  . Financial resource strain: Not on file  . Food insecurity - worry: Not on file  . Food insecurity - inability: Not on file  . Transportation needs - medical: Not on file  . Transportation needs - non-medical: Not on file  Occupational History  . Not on file  Tobacco Use  . Smoking status: Current Every Day Smoker    Packs/day: 0.25    Years: 5.00    Pack years: 1.25  . Smokeless tobacco: Never Used  . Tobacco comment: 3 cigs/day  Substance  and Sexual Activity  . Alcohol use: Yes    Comment: occasional in past but not since becoming pregnant.  . Drug use: No  . Sexual activity: Yes    Birth control/protection: None  Other Topics Concern  . Not on file  Social History Narrative  . Not on file    No past surgical history on file.  Family History  Problem Relation Age of Onset  . Hypertension Mother     No Known Allergies  Current Outpatient Medications on File Prior to Visit  Medication Sig Dispense Refill  . nicotine (NICODERM CQ) 7 mg/24hr patch Place 1 patch (7 mg total) onto the skin daily. 28 patch 0  . Prenatal Vit-Fe Fumarate-FA (MULTIVITAMIN-PRENATAL) 27-0.8 MG TABS tablet Take 1 tablet by mouth daily at 12 noon. Or equivalent. 30 each 11   No current facility-administered medications on file prior to visit.     BP (!) 102/55   Pulse 76   Temp 98.2 F (36.8 C) (Oral)   Resp 16   Ht 5\' 4"  (1.626 m)   Wt 213 lb (96.6 kg)   LMP 12/08/2016   SpO2 99%   BMI 36.56 kg/m       Objective:   Physical Exam  General- No acute distress. Pleasant patient. Lungs- Clear, even and unlabored. Heart- regular rate and rhythm. Neurologic- CNII- XII grossly intact.    Lt hand- dorsal aspect of 5th digit  has v shaped laceration behind pip area(finger in splint). 4th digit has abrasion area over pip joint dorsal aspect.About 3-4 mm wide circular shaped.Pt can partially flex finger(still not able to fully flex digit)      Assessment & Plan:  Suture all removed successfully. I do think placing coban over suture site for next 2 days is good idea. Removed sutures little early since they were beginning to get buried in wound. After two days coban not needed.  Will refer to sports medicine to evaluate your 4th digit decreased flexion.  Follow up as needed  Mackie Pai, PA-C

## 2017-03-31 ENCOUNTER — Ambulatory Visit: Payer: 59 | Admitting: Family Medicine

## 2017-04-01 MED FILL — PRENATAL VITAMIN PLUS LOW I: 27-1 | 30 days supply | Qty: 30 | Fill #1

## 2017-04-03 ENCOUNTER — Ambulatory Visit: Payer: 59 | Admitting: Family Medicine

## 2017-04-10 ENCOUNTER — Ambulatory Visit (INDEPENDENT_AMBULATORY_CARE_PROVIDER_SITE_OTHER): Payer: 59 | Admitting: Family Medicine

## 2017-04-10 VITALS — BP 115/71 | HR 99 | Wt 203.0 lb

## 2017-04-10 DIAGNOSIS — Z2839 Other underimmunization status: Secondary | ICD-10-CM

## 2017-04-10 DIAGNOSIS — Z283 Underimmunization status: Secondary | ICD-10-CM

## 2017-04-10 DIAGNOSIS — Z3400 Encounter for supervision of normal first pregnancy, unspecified trimester: Secondary | ICD-10-CM

## 2017-04-10 DIAGNOSIS — O9989 Other specified diseases and conditions complicating pregnancy, childbirth and the puerperium: Secondary | ICD-10-CM

## 2017-04-10 DIAGNOSIS — O09899 Supervision of other high risk pregnancies, unspecified trimester: Secondary | ICD-10-CM

## 2017-04-10 DIAGNOSIS — R8271 Bacteriuria: Secondary | ICD-10-CM

## 2017-04-10 NOTE — Progress Notes (Signed)
   PRENATAL VISIT NOTE  Subjective:  Melanie Cruz is a 22 y.o. G1P0 at [redacted]w[redacted]d being seen today for ongoing prenatal care.  She is currently monitored for the following issues for this low-risk pregnancy and has Supervision of normal first pregnancy, antepartum; Rubella non-immune status, antepartum; GBS bacteriuria; and Tobacco abuse counseling on their problem list.  Patient reports no complaints.  Contractions: Not present. Vag. Bleeding: None.  Movement: Present. Denies leaking of fluid.   The following portions of the patient's history were reviewed and updated as appropriate: allergies, current medications, past family history, past medical history, past social history, past surgical history and problem list. Problem list updated.  Objective:   Vitals:   04/10/17 1031  BP: 115/71  Pulse: 99  Weight: 203 lb (92.1 kg)    Fetal Status: Fetal Heart Rate (bpm): 155   Movement: Present     General:  Alert, oriented and cooperative. Patient is in no acute distress.  Skin: Skin is warm and dry. No rash noted.   Cardiovascular: Normal heart rate noted  Respiratory: Normal respiratory effort, no problems with respiration noted  Abdomen: Soft, gravid, appropriate for gestational age.  Pain/Pressure: Absent     Pelvic: Cervical exam deferred        Extremities: Normal range of motion.  Edema: None  Mental Status: Normal mood and affect. Normal behavior. Normal judgment and thought content.   Assessment and Plan:  Pregnancy: G1P0 at [redacted]w[redacted]d  1. Supervision of normal first pregnancy, antepartum FHT and FH normal. Had abscess on thigh - went to PCP, but spontaneously drained. Quad screen today.  2. GBS bacteriuria Intrapartum prophylaxis  3. Rubella non-immune status, antepartum Rubella equivocal, just below cutoff. Rpt rubella today  Preterm labor symptoms and general obstetric precautions including but not limited to vaginal bleeding, contractions, leaking of fluid and fetal movement  were reviewed in detail with the patient. Please refer to After Visit Summary for other counseling recommendations.  Return in about 1 month (around 05/08/2017).  Future Appointments  Date Time Provider [place]  04/18/2017 10:45 AM [facility] [place] 2 [facility]    Truett Mainland, DO

## 2017-04-11 ENCOUNTER — Encounter: Payer: 59 | Admitting: Family Medicine

## 2017-04-12 LAB — AFP TETRA
DIA MOM VALUE: 2.14
DIA Value (EIA): 291.44 pg/mL
DSR (BY AGE) 1 IN: 1052
DSR (Second Trimester) 1 IN: 2637
Gestational Age: 17.4 WEEKS
MATERNAL AGE AT EDD: 24.5 a
MSAFP Mom: 0.8
MSAFP: 27.6 ng/mL
MSHCG Mom: 0.61
MSHCG: 14191 m[IU]/mL
OSB RISK: 10000
T18 (By Age): 1:4098 {titer}
TEST RESULTS AFP: NEGATIVE
Weight: 213 [lb_av]
uE3 Mom: 1.04
uE3 Value: 1.08 ng/mL

## 2017-04-12 LAB — RUBELLA SCREEN: Rubella Antibodies, IGG: 0.9 index — ABNORMAL LOW (ref >0.99)

## 2017-04-14 ENCOUNTER — Encounter: Payer: Self-pay | Admitting: Family Medicine

## 2017-04-15 ENCOUNTER — Ambulatory Visit (INDEPENDENT_AMBULATORY_CARE_PROVIDER_SITE_OTHER): Payer: 59 | Admitting: Advanced Practice Midwife

## 2017-04-15 ENCOUNTER — Encounter: Payer: 59 | Admitting: Advanced Practice Midwife

## 2017-04-15 ENCOUNTER — Encounter: Payer: Self-pay | Admitting: Advanced Practice Midwife

## 2017-04-15 VITALS — BP 116/78 | HR 98 | Wt 213.0 lb

## 2017-04-15 DIAGNOSIS — N6310 Unspecified lump in the right breast, unspecified quadrant: Secondary | ICD-10-CM

## 2017-04-15 DIAGNOSIS — Z3400 Encounter for supervision of normal first pregnancy, unspecified trimester: Secondary | ICD-10-CM

## 2017-04-15 DIAGNOSIS — Z3402 Encounter for supervision of normal first pregnancy, second trimester: Secondary | ICD-10-CM

## 2017-04-15 DIAGNOSIS — L0292 Furuncle, unspecified: Secondary | ICD-10-CM | POA: Insufficient documentation

## 2017-04-15 NOTE — Patient Instructions (Signed)
Skin Abscess A skin abscess is an infected area on or under your skin that contains a collection of pus and other material. An abscess may also be called a furuncle, carbuncle, or boil. An abscess can occur in or on almost any part of your body. Some abscesses break open (rupture) on their own. Most continue to get worse unless they are treated. The infection can spread deeper into the body and eventually into your blood, which can make you feel ill. Treatment usually involves draining the abscess. What are the causes? An abscess occurs when germs, often bacteria, pass through your skin and cause an infection. This may be caused by:  A scrape or cut on your skin.  A puncture wound through your skin, including a needle injection.  Blocked oil or sweat glands.  Blocked and infected hair follicles.  A cyst that forms beneath your skin (sebaceous cyst) and becomes infected.  What increases the risk? This condition is more likely to develop in people who:  Have a weak body defense system (immune system).  Have diabetes.  Have dry and irritated skin.  Get frequent injections or use illegal IV drugs.  Have a foreign body in a wound, such as a splinter.  Have problems with their lymph system or veins.  What are the signs or symptoms? An abscess may start as a painful, firm bump under the skin. Over time, the abscess may get larger or become softer. Pus may appear at the top of the abscess, causing pressure and pain. It may eventually break through the skin and drain. Other symptoms include:  Redness.  Warmth.  Swelling.  Tenderness.  A sore on the skin.  How is this diagnosed? This condition is diagnosed based on your medical history and a physical exam. A sample of pus may be taken from the abscess to find out what is causing the infection and what antibiotics can be used to treat it. You also may have:  Blood tests to look for signs of infection or spread of an infection to  your blood.  Imaging studies such as ultrasound, CT scan, or MRI if the abscess is deep.  How is this treated? Small abscesses that drain on their own may not need treatment. Treatment for an abscess that does not rupture on its own may include:  Warm compresses applied to the area several times per day.  Incision and drainage. Your health care provider will make an incision to open the abscess and will remove pus and any foreign body or dead tissue. The incision area may be packed with gauze to keep it open for a few days while it heals.  Antibiotic medicines to treat infection. For a severe abscess, you may first get antibiotics through an IV and then change to oral antibiotics.  Follow these instructions at home: Abscess Care  If you have an abscess that has not drained, place a warm, clean, wet washcloth over the abscess several times a day. Do this as told by your health care provider.  Follow instructions from your health care provider about how to take care of your abscess. Make sure you: ? Cover the abscess with a bandage (dressing). ? Change your dressing or gauze as told by your health care provider. ? Wash your hands with soap and water before you change the dressing or gauze. If soap and water are not available, use hand sanitizer.  Check your abscess every day for signs of a worsening infection. Check for: ?   More redness, swelling, or pain. ? More fluid or blood. ? Warmth. ? More pus or a bad smell. Medicines  Take over-the-counter and prescription medicines only as told by your health care provider.  If you were prescribed an antibiotic medicine, take it as told by your health care provider. Do not stop taking the antibiotic even if you start to feel better. General instructions  To avoid spreading the infection: ? Do not share personal care items, towels, or hot tubs with others. ? Avoid making skin contact with other people.  Keep all follow-up visits as told by  your health care provider. This is important. Contact a health care provider if:  You have more redness, swelling, or pain around your abscess.  You have more fluid or blood coming from your abscess.  Your abscess feels warm to the touch.  You have more pus or a bad smell coming from your abscess.  You have a fever.  You have muscle aches.  You have chills or a general ill feeling. Get help right away if:  You have severe pain.  You see red streaks on your skin spreading away from the abscess. This information is not intended to replace advice given to you by your health care provider. Make sure you discuss any questions you have with your health care provider. Document Released: 10/03/2004 Document Revised: 08/20/2015 Document Reviewed: 11/02/2014 Elsevier Interactive Patient Education  2018 [employer]. Breast Cyst A breast cyst is a sac in the breast that is filled with fluid. Breast cysts are usually noncancerous (benign). They are common among women, and they are most often located in the upper, outer portion of the breast. One or more cysts may develop. They form when fluid builds up inside of the breast glands. There are several types of breast cysts:  Macrocyst. This is a cyst that is about 2 inches (5.1 cm) across (in diameter).  Microcyst. This is a very small cyst that you cannot feel, but it can be seen with imaging tests such as an X-ray of the breast (mammogram) or ultrasound.  Galactocele. This is a cyst that contains milk. It may develop if you suddenly stop breastfeeding.  Breast cysts do not increase your risk of breast cancer. They usually disappear after menopause, unless you take artificial hormones (are on hormone therapy). What are the causes? The exact cause of breast cysts is not known. Possible causes include:  Blockage of tubes (ducts) in the breast glands, which leads to fluid buildup. Duct blockage may result from: ? Fibrocystic breast changes.  This is a common, benign condition that occurs when women go through hormonal changes during the menstrual cycle. This is a common cause of multiple breast cysts. ? Overgrowth of breast tissue or breast glands. ? Scar tissue in the breast from previous surgery.  Changes in certain female hormones (estrogen and progesterone).  What increases the risk? You may be more likely to develop breast cysts if you have not gone through menopause. What are the signs or symptoms? Symptoms of a breast cyst may include:  Feeling one or more smooth, round, soft lumps (like grapes) in the breast that are easily moveable. The lump(s) may get bigger and more painful before your period and get smaller after your period.  Breast discomfort or pain.  How is this diagnosed? A cyst can be felt during a physical exam by your health care provider. A mammogram and ultrasound will be done to confirm the diagnosis. Fluid may be removed  from the cyst with a needle (fine-needle aspiration) and tested to make sure the cyst is not cancerous. How is this treated? Treatment may not be necessary. Your health care provider may monitor the cyst to see if it goes away on its own. If the cyst is uncomfortable or gets bigger, or if you do not like how the cyst makes your breast look, you may need treatment. Treatment may include:  Hormone treatment.  Fine-needle aspiration, to drain fluid from the cyst. There is a chance of the cyst coming back (recurring) after aspiration.  Surgery to remove the cyst.  Follow these instructions at home:  See your health care provider regularly. ? Get a yearly physical exam. ? If you are 42-46 years old, get a clinical breast exam every 1-3 years. After age 64, get this exam every year. ? Get mammograms as often as directed.  Do a breast self-exam every month, or as often as directed. Having many breast cysts, or "lumpy" breasts, may make it harder to feel for new lumps. Understand how your  breasts normally look and feel, and write down any changes in your breasts so you can tell your health care provider about the changes. A breast self-exam involves: ? Comparing your breasts in the mirror. ? Looking for visible changes in your skin or nipples. ? Feeling for lumps or changes.  Take over-the-counter and prescription medicines only as told by your health care provider.  Wear a supportive bra, especially when exercising.  Follow instructions from your health care provider about eating and drinking restrictions. ? Avoid caffeine. ? Cut down on salt (sodium) in what you eat and drink, especially before your menstrual period. Too much sodium can cause fluid buildup (retention), breast swelling, and discomfort.  Keep all follow-up visits as told your health care provider. This is important. Contact a health care provider if:  You feel, or think you feel, a lump in your breast.  You notice that both breasts look or feel different than usual.  Your breast is still causing pain after your menstrual period is over.  You find new lumps or bumps that were not there before.  You feel lumps in your armpit (axilla). Get help right away if:  You have severe pain, tenderness, redness, or warmth in your breast.  You have fluid or blood leaking from your nipple.  Your breast lump becomes hard and painful.  You notice dimpling or wrinkling of the breast or nipple. This information is not intended to replace advice given to you by your health care provider. Make sure you discuss any questions you have with your health care provider. Document Released: 12/24/2004 Document Revised: 09/15/2015 Document Reviewed: 09/15/2015 Elsevier Interactive Patient Education  2017 [employer].

## 2017-04-15 NOTE — Progress Notes (Signed)
   PRENATAL VISIT NOTE  Subjective:  Melanie Cruz is a 22 y.o. G1P0 at [redacted]w[redacted]d being seen today for ongoing prenatal care.  She is currently monitored for the following issues for this low-risk pregnancy and has Supervision of normal first pregnancy, antepartum; Rubella non-immune status, antepartum; GBS bacteriuria; Tobacco abuse counseling; and Furunculosis of multiple sites on their problem list.  Patient reports multiple abscesses buttocks and inner thighs, open spontaneously.  Contractions: Not present.  .  Movement: Present. Denies leaking of fluid.   The following portions of the patient's history were reviewed and updated as appropriate: allergies, current medications, past family history, past medical history, past social history, past surgical history and problem list. Problem list updated.  Objective:   Vitals:   04/15/17 0905  BP: 116/78  Pulse: 98  Weight: 213 lb (96.6 kg)    Fetal Status:     Movement: Present     General:  Alert, oriented and cooperative. Patient is in no acute distress.  Skin: Skin is warm and dry. No rash noted.   Cardiovascular: Normal heart rate noted  Respiratory: Normal respiratory effort, no problems with respiration noted  Abdomen: Soft, gravid, appropriate for gestational age.  Pain/Pressure: Absent     Pelvic: Cervical exam deferred        Extremities: Normal range of motion.  Edema: None  Mental Status: Normal mood and affect. Normal behavior. Normal judgment and thought content.   Assessment and Plan:  Pregnancy: G1P0 at [redacted]w[redacted]d  1. Breast mass, right     Not painful     Upper outer, 10:00    Mobile, rubbery, nontender, 1cm     Likely galactocele or benign but will order breast US at breast center - US BREAST LTD UNI RIGHT INC AXILLA; Future  2. Supervision of normal first pregnancy, antepartum     [place] scheduled for anatomy  3. Furunculosis of multiple sites  multiple healed abscesses inner thighs and buttocks  Discussed keeping  clean, dilute bleach/water washes after shower  Preterm labor symptoms and general obstetric precautions including but not limited to vaginal bleeding, contractions, leaking of fluid and fetal movement were reviewed in detail with the patient. Please refer to After Visit Summary for other counseling recommendations.  Return in about 1 month (around 05/13/2017) for [employer].  Future Appointments  Date Time Provider [place]  04/18/2017 10:45 AM [facility] [place] 2 [facility]US [facility]  04/21/2017  1:40 PM [facility] [place] 2 [facility]  05/08/2017 10:00 AM Stinson, Tanna Savoy, DO [facility] None    Hansel Feinstein, CNM

## 2017-04-15 NOTE — Progress Notes (Signed)
Patient complaining rash between thighs and on buttocks. Patient has had it for "awhile now". Kathrene Alu RN

## 2017-04-17 ENCOUNTER — Encounter: Payer: Self-pay | Admitting: Family Medicine

## 2017-04-18 ENCOUNTER — Other Ambulatory Visit: Payer: 59

## 2017-04-18 ENCOUNTER — Ambulatory Visit (HOSPITAL_COMMUNITY): Payer: 59

## 2017-04-21 ENCOUNTER — Encounter: Payer: Self-pay | Admitting: Advanced Practice Midwife

## 2017-04-21 ENCOUNTER — Ambulatory Visit
Admission: RE | Admit: 2017-04-21 | Discharge: 2017-04-21 | Disposition: A | Discharge status: Home / Self Care | Payer: 59 | Source: Ambulatory Visit | Attending: Advanced Practice Midwife | Admitting: Advanced Practice Midwife

## 2017-04-21 DIAGNOSIS — N63 Unspecified lump in unspecified breast: Secondary | ICD-10-CM | POA: Insufficient documentation

## 2017-04-21 DIAGNOSIS — N6310 Unspecified lump in the right breast, unspecified quadrant: Secondary | ICD-10-CM

## 2017-04-24 ENCOUNTER — Encounter: Payer: Self-pay | Admitting: Family Medicine

## 2017-04-29 ENCOUNTER — Ambulatory Visit (HOSPITAL_COMMUNITY)
Admission: RE | Admit: 2017-04-29 | Discharge: 2017-04-29 | Disposition: A | Discharge status: Home / Self Care | Payer: 59 | Source: Ambulatory Visit | Attending: Family Medicine | Admitting: Family Medicine

## 2017-04-29 ENCOUNTER — Other Ambulatory Visit: Payer: Self-pay | Admitting: Family Medicine

## 2017-04-29 DIAGNOSIS — Z3A20 20 weeks gestation of pregnancy: Secondary | ICD-10-CM | POA: Insufficient documentation

## 2017-04-29 DIAGNOSIS — O99212 Obesity complicating pregnancy, second trimester: Secondary | ICD-10-CM

## 2017-04-29 DIAGNOSIS — Z3400 Encounter for supervision of normal first pregnancy, unspecified trimester: Secondary | ICD-10-CM

## 2017-04-29 DIAGNOSIS — Z363 Encounter for antenatal screening for malformations: Secondary | ICD-10-CM

## 2017-05-08 ENCOUNTER — Ambulatory Visit (INDEPENDENT_AMBULATORY_CARE_PROVIDER_SITE_OTHER): Payer: 59 | Admitting: Family Medicine

## 2017-05-08 VITALS — BP 111/64 | HR 90 | Wt 213.0 lb

## 2017-05-08 DIAGNOSIS — O9989 Other specified diseases and conditions complicating pregnancy, childbirth and the puerperium: Secondary | ICD-10-CM

## 2017-05-08 DIAGNOSIS — O09899 Supervision of other high risk pregnancies, unspecified trimester: Secondary | ICD-10-CM

## 2017-05-08 DIAGNOSIS — Z2839 Other underimmunization status: Secondary | ICD-10-CM

## 2017-05-08 DIAGNOSIS — Z3400 Encounter for supervision of normal first pregnancy, unspecified trimester: Secondary | ICD-10-CM

## 2017-05-08 DIAGNOSIS — Z3402 Encounter for supervision of normal first pregnancy, second trimester: Secondary | ICD-10-CM

## 2017-05-08 DIAGNOSIS — R8271 Bacteriuria: Secondary | ICD-10-CM

## 2017-05-08 DIAGNOSIS — Z283 Underimmunization status: Secondary | ICD-10-CM

## 2017-05-08 NOTE — Progress Notes (Signed)
   PRENATAL VISIT NOTE  Subjective:  Melanie Cruz is a 22 y.o. G1P0 at 71w4dbeing seen today for ongoing prenatal care.  She is currently monitored for the following issues for this low-risk pregnancy and has Supervision of normal first pregnancy, antepartum; Rubella non-immune status, antepartum; GBS bacteriuria; Tobacco abuse counseling; Furunculosis of multiple sites; and Breast mass on their problem list.  Patient reports no complaints.  Contractions: Not present. Vag. Bleeding: None.  Movement: Present. Denies leaking of fluid.   The following portions of the patient's history were reviewed and updated as appropriate: allergies, current medications, past family history, past medical history, past social history, past surgical history and problem list. Problem list updated.  Objective:   Vitals:   05/08/17 1023  BP: 111/64  Pulse: 90  Weight: 213 lb (96.6 kg)    Fetal Status: Fetal Heart Rate (bpm): 147 Fundal Height: 22 cm Movement: Present     General:  Alert, oriented and cooperative. Patient is in no acute distress.  Skin: Skin is warm and dry. No rash noted.   Cardiovascular: Normal heart rate noted  Respiratory: Normal respiratory effort, no problems with respiration noted  Abdomen: Soft, gravid, appropriate for gestational age.  Pain/Pressure: Absent     Pelvic: Cervical exam deferred        Extremities: Normal range of motion.  Edema: Trace  Mental Status: Normal mood and affect. Normal behavior. Normal judgment and thought content.   Assessment and Plan:  Pregnancy: G1P0 at 213w4d1. Supervision of normal first pregnancy, antepartum FHT and FH normal. No other concerns.  2. Rubella non-immune status, antepartum MMR post delivery  3. GBS bacteriuria Intrapartum prophylaxis  Preterm labor symptoms and general obstetric precautions including but not limited to vaginal bleeding, contractions, leaking of fluid and fetal movement were reviewed in detail with the  patient. Please refer to After Visit Summary for other counseling recommendations.  Return in about 1 month (around 06/05/2017).  No future appointments.  JaTruett MainlandDO

## 2017-05-12 MED FILL — PRENATAL VITAMIN PLUS LOW I: 27-1 | 30 days supply | Qty: 30 | Fill #2

## 2017-05-16 ENCOUNTER — Encounter: Payer: Self-pay | Admitting: Family Medicine

## 2017-05-23 ENCOUNTER — Other Ambulatory Visit: Payer: Self-pay

## 2017-05-23 ENCOUNTER — Encounter (HOSPITAL_COMMUNITY): Payer: Self-pay

## 2017-05-23 ENCOUNTER — Inpatient Hospital Stay (HOSPITAL_COMMUNITY)
Admission: AD | Admit: 2017-05-23 | Discharge: 2017-05-23 | Disposition: A | Discharge status: Home / Self Care | Payer: 59 | Source: Ambulatory Visit | Attending: Obstetrics & Gynecology | Admitting: Obstetrics & Gynecology

## 2017-05-23 DIAGNOSIS — Z3A23 23 weeks gestation of pregnancy: Secondary | ICD-10-CM | POA: Diagnosis not present

## 2017-05-23 DIAGNOSIS — O9989 Other specified diseases and conditions complicating pregnancy, childbirth and the puerperium: Secondary | ICD-10-CM

## 2017-05-23 DIAGNOSIS — R102 Pelvic and perineal pain: Secondary | ICD-10-CM | POA: Insufficient documentation

## 2017-05-23 DIAGNOSIS — F17210 Nicotine dependence, cigarettes, uncomplicated: Secondary | ICD-10-CM | POA: Insufficient documentation

## 2017-05-23 DIAGNOSIS — R109 Unspecified abdominal pain: Secondary | ICD-10-CM | POA: Diagnosis not present

## 2017-05-23 DIAGNOSIS — O26892 Other specified pregnancy related conditions, second trimester: Secondary | ICD-10-CM | POA: Insufficient documentation

## 2017-05-23 DIAGNOSIS — O2312 Infections of bladder in pregnancy, second trimester: Secondary | ICD-10-CM | POA: Diagnosis not present

## 2017-05-23 DIAGNOSIS — O99332 Smoking (tobacco) complicating pregnancy, second trimester: Secondary | ICD-10-CM | POA: Diagnosis not present

## 2017-05-23 DIAGNOSIS — N949 Unspecified condition associated with female genital organs and menstrual cycle: Secondary | ICD-10-CM

## 2017-05-23 LAB — CBC
HEMATOCRIT: 33.4 % — AB (ref 36.0–46.0)
Hemoglobin: 10.9 g/dL — ABNORMAL LOW (ref 12.0–15.0)
MCH: 27.4 pg (ref 26.0–34.0)
MCHC: 32.6 g/dL (ref 30.0–36.0)
MCV: 83.9 fL (ref 78.0–100.0)
PLATELETS: 182 10*3/uL (ref 150–400)
RBC: 3.98 MIL/uL (ref 3.87–5.11)
RDW: 14 % (ref 11.5–15.5)
WBC: 9.3 10*3/uL (ref 4.0–10.5)

## 2017-05-23 LAB — URINALYSIS, ROUTINE W REFLEX MICROSCOPIC
Bilirubin Urine: NEGATIVE
Glucose, UA: NEGATIVE mg/dL
Hgb urine dipstick: NEGATIVE
KETONES UR: NEGATIVE mg/dL
Nitrite: POSITIVE — AB
PH: 6 (ref 5.0–8.0)
Protein, ur: NEGATIVE mg/dL
SPECIFIC GRAVITY, URINE: 1.014 (ref 1.005–1.030)

## 2017-05-23 LAB — WET PREP, GENITAL
Sperm: NONE SEEN
TRICH WET PREP: NONE SEEN
YEAST WET PREP: NONE SEEN

## 2017-05-23 MED ORDER — CEPHALEXIN 500 MG PO CAPS: 500.0000 mg | ORAL_CAPSULE | Freq: Four times a day (QID) | ORAL | 0 refills | Status: DC

## 2017-05-23 NOTE — Discharge Instructions (Signed)
Urinary Tract Infection, Adult A urinary tract infection (UTI) is an infection of any part of the urinary tract, which includes the kidneys, ureters, bladder, and urethra. These organs make, store, and get rid of urine in the body. UTI can be a bladder infection (cystitis) or kidney infection (pyelonephritis). What are the causes? This infection may be caused by fungi, viruses, or bacteria. Bacteria are the most common cause of UTIs. This condition can also be caused by repeated incomplete emptying of the bladder during urination. What increases the risk? This condition is more likely to develop if:  You ignore your need to urinate or hold urine for long periods of time.  You do not empty your bladder completely during urination.  You wipe back to front after urinating or having a bowel movement, if you are female.  You are uncircumcised, if you are female.  You are constipated.  You have a urinary catheter that stays in place (indwelling).  You have a weak defense (immune) system.  You have a medical condition that affects your bowels, kidneys, or bladder.  You have diabetes.  You take antibiotic medicines frequently or for long periods of time, and the antibiotics no longer work well against certain types of infections (antibiotic resistance).  You take medicines that irritate your urinary tract.  You are exposed to chemicals that irritate your urinary tract.  You are female.  What are the signs or symptoms? Symptoms of this condition include:  Fever.  Frequent urination or passing small amounts of urine frequently.  Needing to urinate urgently.  Pain or burning with urination.  Urine that smells bad or unusual.  Cloudy urine.  Pain in the lower abdomen or back.  Trouble urinating.  Blood in the urine.  Vomiting or being less hungry than normal.  Diarrhea or abdominal pain.  Vaginal discharge, if you are female.  How is this diagnosed? This condition is  diagnosed with a medical history and physical exam. You will also need to provide a urine sample to test your urine. Other tests may be done, including:  Blood tests.  Sexually transmitted disease (STD) testing.  If you have had more than one UTI, a cystoscopy or imaging studies may be done to determine the cause of the infections. How is this treated? Treatment for this condition often includes a combination of two or more of the following:  Antibiotic medicine.  Other medicines to treat less common causes of UTI.  Over-the-counter medicines to treat pain.  Drinking enough water to stay hydrated.  Follow these instructions at home:  Take over-the-counter and prescription medicines only as told by your health care provider.  If you were prescribed an antibiotic, take it as told by your health care provider. Do not stop taking the antibiotic even if you start to feel better.  Avoid alcohol, caffeine, tea, and carbonated beverages. They can irritate your bladder.  Drink enough fluid to keep your urine clear or pale yellow.  Keep all follow-up visits as told by your health care provider. This is important.  Make sure to: ? Empty your bladder often and completely. Do not hold urine for long periods of time. ? Empty your bladder before and after sex. ? Wipe from front to back after a bowel movement if you are female. Use each tissue one time when you wipe. Contact a health care provider if:  You have back pain.  You have a fever.  You feel nauseous or vomit.  Your symptoms do not  get better after 3 days.  Your symptoms go away and then return. Get help right away if:  You have severe back pain or lower abdominal pain.  You are vomiting and cannot keep down any medicines or water. This information is not intended to replace advice given to you by your health care provider. Make sure you discuss any questions you have with your health care provider. Document Released:  10/03/2004 Document Revised: 06/07/2015 Document Reviewed: 11/14/2014 Elsevier Interactive Patient Education  2018 [employer]. Round Ligament Pain The round ligament is a cord of muscle and tissue that helps to support the uterus. It can become a source of pain during pregnancy if it becomes stretched or twisted as the baby grows. The pain usually begins in the second trimester of pregnancy, and it can come and go until the baby is delivered. It is not a serious problem, and it does not cause harm to the baby. Round ligament pain is usually a short, sharp, and pinching pain, but it can also be a dull, lingering, and aching pain. The pain is felt in the lower side of the abdomen or in the groin. It usually starts deep in the groin and moves up to the outside of the hip area. Pain can occur with:  A sudden change in position.  Rolling over in bed.  Coughing or sneezing.  Physical activity.  Follow these instructions at home: Watch your condition for any changes. Take these steps to help with your pain:  When the pain starts, relax. Then try: ? Sitting down. ? Flexing your knees up to your abdomen. ? Lying on your side with one pillow under your abdomen and another pillow between your legs. ? Sitting in a warm bath for 15-20 minutes or until the pain goes away.  Take over-the-counter and prescription medicines only as told by your health care provider.  Move slowly when you sit and stand.  Avoid long walks if they cause pain.  Stop or lessen your physical activities if they cause pain.  Contact a health care provider if:  Your pain does not go away with treatment.  You feel pain in your back that you did not have before.  Your medicine is not helping. Get help right away if:  You develop a fever or chills.  You develop uterine contractions.  You develop vaginal bleeding.  You develop nausea or vomiting.  You develop diarrhea.  You have pain when you urinate. This  information is not intended to replace advice given to you by your health care provider. Make sure you discuss any questions you have with your health care provider. Document Released: 10/03/2007 Document Revised: 06/01/2015 Document Reviewed: 03/02/2014 Elsevier Interactive Patient Education  [employer].

## 2017-05-23 NOTE — MAU Note (Signed)
Not in lobby

## 2017-05-23 NOTE — MAU Provider Note (Signed)
History     CSN: [id number]  Arrival date and time: 05/23/17 1724   First Provider Initiated Contact with Patient 05/23/17 1845      Chief Complaint  Patient presents with  . Abdominal Pain   HPI Melanie Cruz is a 22 y.o. G1P0 at [redacted]w[redacted]d who presents with left sided abdominal pain. She states the pain comes and goes and is worse when she moves. She describes it as sharp and rates it as a 6/10 when it happens. Denies any pain now. Denies any leaking, vaginal bleeding or discharge. Reports good fetal movement.   OB History    Gravida  1   Para      Term      Preterm      AB      Living        SAB      TAB      Ectopic      Multiple      Live Births              Past Medical History:  Diagnosis Date  . Migraine     History reviewed. No pertinent surgical history.  Family History  Problem Relation Age of Onset  . Hypertension Mother   . Breast cancer Paternal Aunt        age at onset 51's    Social History   Tobacco Use  . Smoking status: Current Every Day Smoker    Packs/day: 0.25    Years: 5.00    Pack years: 1.25  . Smokeless tobacco: Never Used  . Tobacco comment: 3 cigs/day  Substance Use Topics  . Alcohol use: Yes    Comment: occasional in past but not since becoming pregnant.  . Drug use: Yes    Types: Marijuana    Comment: occasional-last use 05/23/2017    Allergies: No Known Allergies  Medications Prior to Admission  Medication Sig Dispense Refill Last Dose  . acetaminophen (TYLENOL) 500 MG tablet Take 500 mg by mouth every 6 (six) hours as needed.   Taking  . nicotine (NICODERM CQ) 7 mg/24hr patch Place 1 patch (7 mg total) onto the skin daily. (Patient not taking: Reported on 05/08/2017) 28 patch 0 Not Taking  . Prenatal Vit-Fe Fumarate-FA (MULTIVITAMIN-PRENATAL) 27-0.8 MG TABS tablet Take 1 tablet by mouth daily at 12 noon. Or equivalent. 30 each 11 Taking  . Prenatal Vit-Fe Fumarate-FA (PRENATAL VITAMIN PLUS LOW IRON) 27-1 MG  TABS   11 Not Taking    Review of Systems  Constitutional: Negative.  Negative for fatigue and fever.  HENT: Negative.   Respiratory: Negative.  Negative for shortness of breath.   Cardiovascular: Negative.  Negative for chest pain.  Gastrointestinal: Positive for abdominal pain. Negative for constipation, diarrhea, nausea and vomiting.  Genitourinary: Negative.  Negative for dysuria, vaginal bleeding and vaginal discharge.  Neurological: Negative.  Negative for dizziness and headaches.   Physical Exam   Blood pressure 119/65, pulse 83, temperature 98.9 F (37.2 C), temperature source Oral, resp. rate 17, weight 216 lb 4 oz (98.1 kg), last menstrual period 12/08/2016, SpO2 100 %.  Physical Exam  Nursing note and vitals reviewed. Constitutional: She is oriented to person, place, and time. She appears well-developed and well-nourished. No distress.  HENT:  Head: Normocephalic.  Eyes: Pupils are equal, round, and reactive to light.  Cardiovascular: Normal rate, regular rhythm and normal heart sounds.  Respiratory: Effort normal and breath sounds normal. No respiratory distress.  GI: Soft.  Bowel sounds are normal. She exhibits no distension. There is no tenderness.  Neurological: She is alert and oriented to person, place, and time.  Skin: Skin is warm and dry.  Psychiatric: She has a normal mood and affect. Her behavior is normal. Judgment and thought content normal.   Fetal Tracing:  Baseline: 140 Variability: moderate Accels: 10x10 Decels: none  Toco: none  Dilation: Closed Effacement (%): Thick Cervical Position: Posterior Exam by:: Haynes Bast CNM   MAU Course  Procedures Results for orders placed or performed during the hospital encounter of 05/23/17 (from the past 24 hour(s))  Urinalysis, Routine w reflex microscopic     Status: Abnormal   Collection Time: 05/23/17  5:54 PM  Result Value Ref Range   Color, Urine YELLOW YELLOW   APPearance CLOUDY (A) CLEAR    Specific Gravity, Urine 1.014 1.005 - 1.030   pH 6.0 5.0 - 8.0   Glucose, UA NEGATIVE NEGATIVE mg/dL   Hgb urine dipstick NEGATIVE NEGATIVE   Bilirubin Urine NEGATIVE NEGATIVE   Ketones, ur NEGATIVE NEGATIVE mg/dL   Protein, ur NEGATIVE NEGATIVE mg/dL   Nitrite POSITIVE (A) NEGATIVE   Leukocytes, UA LARGE (A) NEGATIVE   RBC / HPF 11-20 0 - 5 RBC/hpf   WBC, UA >50 (H) 0 - 5 WBC/hpf   Bacteria, UA FEW (A) NONE SEEN   Squamous Epithelial / LPF 6-10 0 - 5   WBC Clumps PRESENT   CBC     Status: Abnormal   Collection Time: 05/23/17  6:50 PM  Result Value Ref Range   WBC 9.3 4.0 - 10.5 K/uL   RBC 3.98 3.87 - 5.11 MIL/uL   Hemoglobin 10.9 (L) 12.0 - 15.0 g/dL   HCT 33.4 (L) 36.0 - 46.0 %   MCV 83.9 78.0 - 100.0 fL   MCH 27.4 26.0 - 34.0 pg   MCHC 32.6 30.0 - 36.0 g/dL   RDW 14.0 11.5 - 15.5 %   Platelets 182 150 - 400 K/uL  Wet prep, genital     Status: Abnormal   Collection Time: 05/23/17  7:05 PM  Result Value Ref Range   Yeast Wet Prep HPF POC NONE SEEN NONE SEEN   Trich, Wet Prep NONE SEEN NONE SEEN   Clue Cells Wet Prep HPF POC PRESENT (A) NONE SEEN   WBC, Wet Prep HPF POC FEW (A) NONE SEEN   Sperm NONE SEEN    MDM UA, UC Wet prep and gc/chlamydia CBC NST reassuring for gestational age  Assessment and Plan   1. Cystitis of pregnancy in second trimester   2. [redacted] weeks gestation of pregnancy   3. Round ligament pain    -Discharge home in stable condition -Rx for keflex sent to patient's pharmacy -Pyelonephritis precautions discussed -Patient advised to follow-up with [facility] as scheduled for prenatal -Patient may return to MAU as needed or if her condition were to change or worsen  [place] 05/23/2017, 7:42 PM

## 2017-05-23 NOTE — MAU Note (Signed)
Keeps having pain in abd, mid left side. Office told her to come in. Pain first started a couple wks ago.  Comes and goes.

## 2017-05-25 LAB — CULTURE, OB URINE: Culture: NO GROWTH

## 2017-05-26 LAB — GC/CHLAMYDIA PROBE AMP (~~LOC~~) NOT AT ARMC
Chlamydia: NEGATIVE
Neisseria Gonorrhea: NEGATIVE

## 2017-06-10 ENCOUNTER — Encounter: Payer: Self-pay | Admitting: Advanced Practice Midwife

## 2017-06-10 ENCOUNTER — Ambulatory Visit (INDEPENDENT_AMBULATORY_CARE_PROVIDER_SITE_OTHER): Payer: 59 | Admitting: Advanced Practice Midwife

## 2017-06-10 VITALS — BP 93/60 | HR 86 | Wt 220.0 lb

## 2017-06-10 DIAGNOSIS — Z3480 Encounter for supervision of other normal pregnancy, unspecified trimester: Secondary | ICD-10-CM

## 2017-06-10 DIAGNOSIS — B373 Candidiasis of vulva and vagina: Secondary | ICD-10-CM | POA: Insufficient documentation

## 2017-06-10 DIAGNOSIS — B3731 Acute candidiasis of vulva and vagina: Secondary | ICD-10-CM

## 2017-06-10 MED ORDER — FLUCONAZOLE 150 MG PO TABS: 150.0000 mg | ORAL_TABLET | Freq: Once | ORAL | 3 refills | Status: AC

## 2017-06-10 NOTE — Progress Notes (Deleted)
   PRENATAL VISIT NOTE  Subjective:  Melanie Cruz is a 22 y.o. G1P0 at [redacted]w[redacted]d being seen today for ongoing prenatal care.  She is currently monitored for the following issues for this {Blank single:19197::"high-risk","low-risk"} pregnancy and has Supervision of normal first pregnancy, antepartum; Rubella non-immune status, antepartum; GBS bacteriuria; Tobacco abuse counseling; Furunculosis of multiple sites; Breast mass; and Yeast vaginitis on their problem list.  Patient reports {sx:14538}.  Contractions: Not present. Vag. Bleeding: None.  Movement: Present. Denies leaking of fluid.   The following portions of the patient's history were reviewed and updated as appropriate: allergies, current medications, past family history, past medical history, past social history, past surgical history and problem list. Problem list updated.  Objective:   Vitals:   06/10/17 1016  BP: 93/60  Pulse: 86  Weight: 220 lb (99.8 kg)    Fetal Status: Fetal Heart Rate (bpm): 152   Movement: Present     General:  Alert, oriented and cooperative. Patient is in no acute distress.  Skin: Skin is warm and dry. No rash noted.   Cardiovascular: Normal heart rate noted  Respiratory: Normal respiratory effort, no problems with respiration noted  Abdomen: Soft, gravid, appropriate for gestational age.  Pain/Pressure: Present     Pelvic: {Blank single:19197::"Cervical exam performed","Cervical exam deferred"}        Extremities: Normal range of motion.  Edema: Trace  Mental Status: Normal mood and affect. Normal behavior. Normal judgment and thought content.   Assessment and Plan:  Pregnancy: G1P0 at [redacted]w[redacted]d  1. Supervision of other normal pregnancy, antepartum *** - Glucose Tolerance, 2 Hours w/1 Hour  2. Yeast vaginitis ***  {Blank single:19197::"Term","Preterm"} labor symptoms and general obstetric precautions including but not limited to vaginal bleeding, contractions, leaking of fluid and fetal movement  were reviewed in detail with the patient. Please refer to After Visit Summary for other counseling recommendations.  Return in about 2 weeks (around 06/24/2017) for [facility].  Future Appointments  Date Time Provider [place]  06/24/2017 10:00 AM Seabron Spates, CNM [facility] None    Hansel Feinstein, CNM

## 2017-06-10 NOTE — Patient Instructions (Signed)

## 2017-06-10 NOTE — Progress Notes (Signed)
   PRENATAL VISIT NOTE  Subjective:  Melanie Cruz is a 22 y.o. G1P0 at [redacted]w[redacted]d being seen today for ongoing prenatal care.  She is currently monitored for the following issues for this low-risk pregnancy and has Supervision of normal first pregnancy, antepartum; Rubella non-immune status, antepartum; GBS bacteriuria; Tobacco abuse counseling; Furunculosis of multiple sites; Breast mass; and Yeast vaginitis on their problem list.  Patient reports intermittent round ligament pain.  Contractions: Not present. Vag. Bleeding: None.  Movement: Present. Denies leaking of fluid.   The following portions of the patient's history were reviewed and updated as appropriate: allergies, current medications, past family history, past medical history, past social history, past surgical history and problem list. Problem list updated.  Objective:   Vitals:   06/10/17 1016  BP: 93/60  Pulse: 86  Weight: 220 lb (99.8 kg)    Fetal Status: Fetal Heart Rate (bpm): 152   Movement: Present     General:  Alert, oriented and cooperative. Patient is in no acute distress.  Skin: Skin is warm and dry. No rash noted.   Cardiovascular: Normal heart rate noted  Respiratory: Normal respiratory effort, no problems with respiration noted  Abdomen: Soft, gravid, appropriate for gestational age.  Pain/Pressure: Present     Pelvic:  EGBUS c/w yeast vaginitis, curdlike discharge, mild erethema  Extremities: Normal range of motion.  Edema: Trace  Mental Status: Normal mood and affect. Normal behavior. Normal judgment and thought content.   Assessment and Plan:  Pregnancy: G1P0 at [redacted]w[redacted]d  1. Supervision of other normal pregnancy, antepartum       Can't do test today, will do tomorrow - Glucose Tolerance, 2 Hours w/1 Hour  2. Yeast vaginitis     Rx Diflucan  Preterm labor symptoms and general obstetric precautions including but not limited to vaginal bleeding, contractions, leaking of fluid and fetal movement were  reviewed in detail with the patient. Please refer to After Visit Summary for other counseling recommendations.  Return in about 2 weeks (around 06/24/2017) for [facility].  Future Appointments  Date Time Provider [place]  06/24/2017 10:00 AM Seabron Spates, CNM [facility] None    Hansel Feinstein, CNM

## 2017-06-13 ENCOUNTER — Other Ambulatory Visit: Payer: 59

## 2017-06-13 ENCOUNTER — Encounter: Payer: Self-pay | Admitting: Advanced Practice Midwife

## 2017-06-24 ENCOUNTER — Encounter: Payer: Self-pay | Admitting: Advanced Practice Midwife

## 2017-06-24 ENCOUNTER — Ambulatory Visit (INDEPENDENT_AMBULATORY_CARE_PROVIDER_SITE_OTHER): Payer: 59 | Admitting: Advanced Practice Midwife

## 2017-06-24 VITALS — BP 91/65 | HR 92 | Wt 218.0 lb

## 2017-06-24 DIAGNOSIS — Z3480 Encounter for supervision of other normal pregnancy, unspecified trimester: Secondary | ICD-10-CM

## 2017-06-24 DIAGNOSIS — Z3400 Encounter for supervision of normal first pregnancy, unspecified trimester: Secondary | ICD-10-CM

## 2017-06-24 DIAGNOSIS — Z3483 Encounter for supervision of other normal pregnancy, third trimester: Secondary | ICD-10-CM

## 2017-06-24 NOTE — Patient Instructions (Signed)

## 2017-06-25 ENCOUNTER — Ambulatory Visit: Payer: 59

## 2017-06-25 ENCOUNTER — Encounter: Payer: Self-pay | Admitting: Advanced Practice Midwife

## 2017-06-25 DIAGNOSIS — Z3490 Encounter for supervision of normal pregnancy, unspecified, unspecified trimester: Secondary | ICD-10-CM

## 2017-06-25 MED FILL — PRENATAL VITAMIN PLUS LOW I: 27-1 | 30 days supply | Qty: 30 | Fill #3

## 2017-06-25 NOTE — Progress Notes (Signed)
   PRENATAL VISIT NOTE  Subjective:  Melanie Cruz is a 22 y.o. G1P0 at 100w3d being seen today for ongoing prenatal care.  She is currently monitored for the following issues for this low-risk pregnancy and has Supervision of normal first pregnancy, antepartum; Rubella non-immune status, antepartum; GBS bacteriuria; Tobacco abuse counseling; Furunculosis of multiple sites; Breast mass; and Yeast vaginitis on their problem list.  Patient reports no complaints.  Contractions: Not present. Vag. Bleeding: None.  Movement: Present. Denies leaking of fluid.   The following portions of the patient's history were reviewed and updated as appropriate: allergies, current medications, past family history, past medical history, past social history, past surgical history and problem list. Problem list updated.  Objective:   Vitals:   06/24/17 1024  BP: 91/65  Pulse: 92  Weight: 218 lb (98.9 kg)    Fetal Status: Fetal Heart Rate (bpm): 150   Movement: Present     General:  Alert, oriented and cooperative. Patient is in no acute distress.  Skin: Skin is warm and dry. No rash noted.   Cardiovascular: Normal heart rate noted  Respiratory: Normal respiratory effort, no problems with respiration noted  Abdomen: Soft, gravid, appropriate for gestational age.  Pain/Pressure: Present     Pelvic: Cervical exam deferred        Extremities: Normal range of motion.  Edema: Trace  Mental Status: Normal mood and affect. Normal behavior. Normal judgment and thought content.   Assessment and Plan:  Pregnancy: G1P0 at [redacted]w[redacted]d  1. Supervision of other normal pregnancy, antepartum     Cannot do GTT today, not fasting      Will come back tomorrow  2. Supervision of normal first pregnancy, antepartum   Preterm labor symptoms and general obstetric precautions including but not limited to vaginal bleeding, contractions, leaking of fluid and fetal movement were reviewed in detail with the patient. Please refer to  After Visit Summary for other counseling recommendations.  Return in about 2 weeks (around 07/08/2017) for [facility].  Future Appointments  Date Time Provider [place]  07/18/2017 10:45 AM Truett Mainland, DO [facility] None    Hansel Feinstein, CNM

## 2017-06-25 NOTE — Progress Notes (Deleted)
   PRENATAL VISIT NOTE  Subjective:  Melanie Cruz is a 22 y.o. G1P0 at [redacted]w[redacted]d being seen today for ongoing prenatal care.  She is currently monitored for the following issues for this {Blank single:19197::"high-risk","low-risk"} pregnancy and has Supervision of normal first pregnancy, antepartum; Rubella non-immune status, antepartum; GBS bacteriuria; Tobacco abuse counseling; Furunculosis of multiple sites; Breast mass; and Yeast vaginitis on their problem list.  Patient reports {sx:14538}.  Contractions: Not present. Vag. Bleeding: None.  Movement: Present. Denies leaking of fluid.   The following portions of the patient's history were reviewed and updated as appropriate: allergies, current medications, past family history, past medical history, past social history, past surgical history and problem list. Problem list updated.  Objective:   Vitals:   06/24/17 1024  BP: 91/65  Pulse: 92  Weight: 218 lb (98.9 kg)    Fetal Status: Fetal Heart Rate (bpm): 150   Movement: Present     General:  Alert, oriented and cooperative. Patient is in no acute distress.  Skin: Skin is warm and dry. No rash noted.   Cardiovascular: Normal heart rate noted  Respiratory: Normal respiratory effort, no problems with respiration noted  Abdomen: Soft, gravid, appropriate for gestational age.  Pain/Pressure: Present     Pelvic: {Blank single:19197::"Cervical exam performed","Cervical exam deferred"}        Extremities: Normal range of motion.  Edema: Trace  Mental Status: Normal mood and affect. Normal behavior. Normal judgment and thought content.   Assessment and Plan:  Pregnancy: G1P0 at [redacted]w[redacted]d  1. Supervision of other normal pregnancy, antepartum ***  2. Supervision of normal first pregnancy, antepartum ***  {Blank single:19197::"Term","Preterm"} labor symptoms and general obstetric precautions including but not limited to vaginal bleeding, contractions, leaking of fluid and fetal movement were  reviewed in detail with the patient. Please refer to After Visit Summary for other counseling recommendations.  Return in about 2 weeks (around 07/08/2017) for [facility].  Future Appointments  Date Time Provider [place]  07/18/2017 10:45 AM Truett Mainland, DO [facility] None    Hansel Feinstein, CNM

## 2017-06-25 NOTE — Progress Notes (Signed)
Patient presents for glucose tolerance testing. Kathrene Alu RN

## 2017-06-26 LAB — GLUCOSE TOLERANCE, 2 HOURS W/ 1HR
Glucose, 1 hour: 146 mg/dL (ref 65–179)
Glucose, 2 hour: 97 mg/dL (ref 65–152)
Glucose, Fasting: 73 mg/dL (ref 65–91)

## 2017-06-26 LAB — CBC
HEMOGLOBIN: 11 g/dL — AB (ref 11.1–15.9)
Hematocrit: 34 % (ref 34.0–46.6)
MCH: 26.8 pg (ref 26.6–33.0)
MCHC: 32.4 g/dL (ref 31.5–35.7)
MCV: 83 fL (ref 79–97)
Platelets: 187 10*3/uL (ref 150–450)
RBC: 4.11 x10E6/uL (ref 3.77–5.28)
RDW: 14.6 % (ref 12.3–15.4)
WBC: 9.7 10*3/uL (ref 3.4–10.8)

## 2017-06-26 LAB — HIV ANTIBODY (ROUTINE TESTING W REFLEX): HIV SCREEN 4TH GENERATION: NONREACTIVE

## 2017-06-26 LAB — RPR: RPR Ser Ql: NONREACTIVE

## 2017-06-30 ENCOUNTER — Encounter: Payer: Self-pay | Admitting: Family Medicine

## 2017-07-02 ENCOUNTER — Encounter: Payer: Self-pay | Admitting: Advanced Practice Midwife

## 2017-07-15 ENCOUNTER — Encounter: Payer: Self-pay | Admitting: Advanced Practice Midwife

## 2017-07-18 ENCOUNTER — Ambulatory Visit (INDEPENDENT_AMBULATORY_CARE_PROVIDER_SITE_OTHER): Payer: 59 | Admitting: Family Medicine

## 2017-07-18 VITALS — BP 114/64 | HR 94 | Wt 220.1 lb

## 2017-07-18 DIAGNOSIS — B9689 Other specified bacterial agents as the cause of diseases classified elsewhere: Secondary | ICD-10-CM

## 2017-07-18 DIAGNOSIS — Z2839 Other underimmunization status: Secondary | ICD-10-CM

## 2017-07-18 DIAGNOSIS — Z3400 Encounter for supervision of normal first pregnancy, unspecified trimester: Secondary | ICD-10-CM

## 2017-07-18 DIAGNOSIS — Z283 Underimmunization status: Secondary | ICD-10-CM

## 2017-07-18 DIAGNOSIS — O9989 Other specified diseases and conditions complicating pregnancy, childbirth and the puerperium: Secondary | ICD-10-CM

## 2017-07-18 DIAGNOSIS — K5900 Constipation, unspecified: Secondary | ICD-10-CM

## 2017-07-18 DIAGNOSIS — N760 Acute vaginitis: Secondary | ICD-10-CM

## 2017-07-18 MED ORDER — SENNA 8.6 MG PO TABS: 1.0000 | ORAL_TABLET | Freq: Every day | ORAL | 0 refills | Status: DC | PRN

## 2017-07-18 MED ORDER — DOCUSATE SODIUM 100 MG PO CAPS
100.0000 mg | ORAL_CAPSULE | Freq: Two times a day (BID) | ORAL | 2 refills | Status: DC | PRN
Start: 2017-07-18 — End: 2017-10-16

## 2017-07-18 MED ORDER — METRONIDAZOLE 500 MG PO TABS: 500.0000 mg | ORAL_TABLET | Freq: Two times a day (BID) | ORAL | 0 refills | Status: DC

## 2017-07-18 MED FILL — SENNA 8.6 MG TABS: 8.6 | 100 days supply | Qty: 100 | Fill #0

## 2017-07-18 MED FILL — metroNIDAZOLE 500 MG TABS: 500 | 7 days supply | Qty: 14 | Fill #0

## 2017-07-18 MED FILL — DOK 100 MG SOFTGEL: 100 | 50 days supply | Qty: 100 | Fill #0

## 2017-07-18 NOTE — Progress Notes (Signed)
   PRENATAL VISIT NOTE  Subjective:  Melanie Cruz is a 22 y.o. G1P0 at 57w5dbeing seen today for ongoing prenatal care.  She is currently monitored for the following issues for this low-risk pregnancy and has Supervision of normal first pregnancy, antepartum; Rubella non-immune status, antepartum; GBS bacteriuria; Tobacco abuse counseling; Furunculosis of multiple sites; Breast mass; and Yeast vaginitis on their problem list.  Patient reports vaginal discharge with foul odor. Constipation.  Contractions: Not present. Vag. Bleeding: None.  Movement: Present. Denies leaking of fluid.   The following portions of the patient's history were reviewed and updated as appropriate: allergies, current medications, past family history, past medical history, past social history, past surgical history and problem list. Problem list updated.  Objective:   Vitals:   07/18/17 1106  BP: 114/64  Pulse: 94  Weight: 220 lb 1.3 oz (99.8 kg)    Fetal Status: Fetal Heart Rate (bpm): 148 Fundal Height: 31 cm Movement: Present     General:  Alert, oriented and cooperative. Patient is in no acute distress.  Skin: Skin is warm and dry. No rash noted.   Cardiovascular: Normal heart rate noted  Respiratory: Normal respiratory effort, no problems with respiration noted  Abdomen: Soft, gravid, appropriate for gestational age.  Pain/Pressure: Present     Pelvic: Cervical exam deferred        Extremities: Normal range of motion.  Edema: Trace  Mental Status: Normal mood and affect. Normal behavior. Normal judgment and thought content.   Assessment and Plan:  Pregnancy: G1P0 at 354w5d1. Supervision of normal first pregnancy, antepartum FHT and FH normal  2. Rubella non-immune status, antepartum MMR postpartum  3. Constipation, unspecified constipation type Colace and senna prescribed  4. BV (bacterial vaginosis) Had wet prep several weeks ago that was never treated. Will prescribe flagyl.   Preterm  labor symptoms and general obstetric precautions including but not limited to vaginal bleeding, contractions, leaking of fluid and fetal movement were reviewed in detail with the patient. Please refer to After Visit Summary for other counseling recommendations.  No follow-ups on file.  No future appointments.  JaTruett MainlandDO

## 2017-08-01 ENCOUNTER — Encounter: Payer: Self-pay | Admitting: Obstetrics and Gynecology

## 2017-08-01 ENCOUNTER — Other Ambulatory Visit (HOSPITAL_COMMUNITY)
Admission: RE | Admit: 2017-08-01 | Discharge: 2017-08-01 | Disposition: A | Discharge status: Home / Self Care | Payer: 59 | Source: Ambulatory Visit | Attending: Obstetrics and Gynecology | Admitting: Obstetrics and Gynecology

## 2017-08-01 ENCOUNTER — Ambulatory Visit (INDEPENDENT_AMBULATORY_CARE_PROVIDER_SITE_OTHER): Payer: 59 | Admitting: Obstetrics and Gynecology

## 2017-08-01 VITALS — BP 109/59 | HR 79 | Wt 224.0 lb

## 2017-08-01 DIAGNOSIS — Z283 Underimmunization status: Secondary | ICD-10-CM

## 2017-08-01 DIAGNOSIS — Z2839 Other underimmunization status: Secondary | ICD-10-CM

## 2017-08-01 DIAGNOSIS — Z3403 Encounter for supervision of normal first pregnancy, third trimester: Secondary | ICD-10-CM | POA: Insufficient documentation

## 2017-08-01 DIAGNOSIS — Z3400 Encounter for supervision of normal first pregnancy, unspecified trimester: Secondary | ICD-10-CM

## 2017-08-01 DIAGNOSIS — O26899 Other specified pregnancy related conditions, unspecified trimester: Secondary | ICD-10-CM

## 2017-08-01 DIAGNOSIS — O9989 Other specified diseases and conditions complicating pregnancy, childbirth and the puerperium: Secondary | ICD-10-CM

## 2017-08-01 DIAGNOSIS — R8271 Bacteriuria: Secondary | ICD-10-CM

## 2017-08-01 DIAGNOSIS — O26893 Other specified pregnancy related conditions, third trimester: Secondary | ICD-10-CM

## 2017-08-01 DIAGNOSIS — N898 Other specified noninflammatory disorders of vagina: Secondary | ICD-10-CM

## 2017-08-01 MED ORDER — CLOTRIMAZOLE 1 % VA CREA: 1.0000 | TOPICAL_CREAM | Freq: Every day | VAGINAL | 0 refills | Status: DC

## 2017-08-01 MED FILL — CLOTRIM 1% VAGINAL CREAM: 1 | 7 days supply | Qty: 45 | Fill #0

## 2017-08-01 NOTE — Progress Notes (Signed)
   PRENATAL VISIT NOTE  Subjective:  Melanie Cruz is a 22 y.o. G1P0 at 69w5dbeing seen today for ongoing prenatal care.  She is currently monitored for the following issues for this low-risk pregnancy and has Supervision of normal first pregnancy, antepartum; Rubella non-immune status, antepartum; GBS bacteriuria; Tobacco abuse counseling; Furunculosis of multiple sites; Breast mass; and Yeast vaginitis on their problem list.  Patient reports vaginal itching (she thinks is secondary to nair), some whitish brown discharge and leaking fluid for about a week, states it is watery, and different from discharge.  Contractions: Not present. Vag. Bleeding: None.  Movement: Present. Denies leaking of fluid.   The following portions of the patient's history were reviewed and updated as appropriate: allergies, current medications, past family history, past medical history, past social history, past surgical history and problem list. Problem list updated.  Objective:   Vitals:   08/01/17 1045  BP: (!) 109/59  Pulse: 79  Weight: 224 lb (101.6 kg)    Fetal Status: Fetal Heart Rate (bpm): 145   Movement: Present     General:  Alert, oriented and cooperative. Patient is in no acute distress.  Skin: Skin is warm and dry. No rash noted.   Cardiovascular: Normal heart rate noted  Respiratory: Normal respiratory effort, no problems with respiration noted  Abdomen: Soft, gravid, appropriate for gestational age.  Pain/Pressure: Present     Pelvic: Cervical exam deferred       thick whiteish discharge in vagina  Extremities: Normal range of motion.  Edema: Trace  Mental Status: Normal mood and affect. Normal behavior. Normal judgment and thought content.   Pooling negative Ferning negative  Assessment and Plan:  Pregnancy: G1P0 at 381w5d1. Supervision of normal first pregnancy, antepartum  2. Rubella non-immune status, antepartum MMR pp  3. GBS bacteriuria ppx in labor  4. Yeast infection No  evidence of ROM Clotrimazole sent to pharmacy Wet prep sent  Preterm labor symptoms and general obstetric precautions including but not limited to vaginal bleeding, contractions, leaking of fluid and fetal movement were reviewed in detail with the patient. Please refer to After Visit Summary for other counseling recommendations.  Return in about 2 weeks (around 08/15/2017) for OB visit.  Future Appointments  Date Time Provider [place]/08/2017 11:00 AM StTruett MainlandDO [facility] None    KeSloan LeiterMD

## 2017-08-01 NOTE — Addendum Note (Signed)
Addended by: Phill Myron on: 08/01/2017 11:31 AM   Modules accepted: Orders

## 2017-08-05 LAB — CERVICOVAGINAL ANCILLARY ONLY
BACTERIAL VAGINITIS: NEGATIVE
Candida vaginitis: POSITIVE — AB

## 2017-08-08 MED FILL — PRENATAL VITAMIN PLUS LOW I: 27-1 | 30 days supply | Qty: 30 | Fill #4

## 2017-08-14 ENCOUNTER — Ambulatory Visit (INDEPENDENT_AMBULATORY_CARE_PROVIDER_SITE_OTHER): Payer: 59 | Admitting: Family Medicine

## 2017-08-14 VITALS — BP 99/67 | HR 94 | Wt 228.0 lb

## 2017-08-14 DIAGNOSIS — Z283 Underimmunization status: Secondary | ICD-10-CM

## 2017-08-14 DIAGNOSIS — Z2839 Other underimmunization status: Secondary | ICD-10-CM

## 2017-08-14 DIAGNOSIS — Z3400 Encounter for supervision of normal first pregnancy, unspecified trimester: Secondary | ICD-10-CM

## 2017-08-14 DIAGNOSIS — R8271 Bacteriuria: Secondary | ICD-10-CM

## 2017-08-14 DIAGNOSIS — Z3403 Encounter for supervision of normal first pregnancy, third trimester: Secondary | ICD-10-CM

## 2017-08-14 DIAGNOSIS — O9989 Other specified diseases and conditions complicating pregnancy, childbirth and the puerperium: Secondary | ICD-10-CM

## 2017-08-14 NOTE — Progress Notes (Signed)
   PRENATAL VISIT NOTE  Subjective:  Melanie Cruz is a 22 y.o. G1P0 at 29w4dbeing seen today for ongoing prenatal care.  She is currently monitored for the following issues for this low-risk pregnancy and has Supervision of normal first pregnancy, antepartum; Rubella non-immune status, antepartum; GBS bacteriuria; Tobacco abuse counseling; Furunculosis of multiple sites; Breast mass; and Yeast vaginitis on their problem list.  Patient reports no complaints.  Contractions: Not present. Vag. Bleeding: None.  Movement: Present. Denies leaking of fluid.   The following portions of the patient's history were reviewed and updated as appropriate: allergies, current medications, past family history, past medical history, past social history, past surgical history and problem list. Problem list updated.  Objective:   Vitals:   08/14/17 1111  BP: 99/67  Pulse: 94  Weight: 228 lb (103.4 kg)    Fetal Status: Fetal Heart Rate (bpm): 134   Movement: Present     General:  Alert, oriented and cooperative. Patient is in no acute distress.  Skin: Skin is warm and dry. No rash noted.   Cardiovascular: Normal heart rate noted  Respiratory: Normal respiratory effort, no problems with respiration noted  Abdomen: Soft, gravid, appropriate for gestational age.  Pain/Pressure: Present     Pelvic: Cervical exam deferred        Extremities: Normal range of motion.  Edema: Trace  Mental Status: Normal mood and affect. Normal behavior. Normal judgment and thought content.   Assessment and Plan:  Pregnancy: G1P0 at 355w4d1. Supervision of normal first pregnancy, antepartum FHT and FH normal  2. Rubella non-immune status, antepartum MMR PP  3. GBS bacteriuria Intrapartum prophylaxis  Preterm labor symptoms and general obstetric precautions including but not limited to vaginal bleeding, contractions, leaking of fluid and fetal movement were reviewed in detail with the patient. Please refer to After  Visit Summary for other counseling recommendations.  Return in about 1 week (around 08/21/2017).  Future Appointments  Date Time Provider [place]  9:45 AM StTruett MainlandDO [facility] None  08/28/2017  9:30 AM HaLavonia DraftsMD [facility] None  09/04/2017 10:00 AM HaLavonia DraftsMD [facility] None    JaTruett MainlandDO

## 2017-08-17 DIAGNOSIS — O26899 Other specified pregnancy related conditions, unspecified trimester: Principal | ICD-10-CM

## 2017-08-17 DIAGNOSIS — G5600 Carpal tunnel syndrome, unspecified upper limb: Secondary | ICD-10-CM

## 2017-08-21 ENCOUNTER — Ambulatory Visit (INDEPENDENT_AMBULATORY_CARE_PROVIDER_SITE_OTHER): Payer: 59 | Admitting: Family Medicine

## 2017-08-21 ENCOUNTER — Other Ambulatory Visit (HOSPITAL_COMMUNITY)
Admission: RE | Admit: 2017-08-21 | Discharge: 2017-08-21 | Disposition: A | Discharge status: Home / Self Care | Payer: 59 | Source: Ambulatory Visit | Attending: Family Medicine | Admitting: Family Medicine

## 2017-08-21 VITALS — BP 107/63 | HR 86 | Wt 231.0 lb

## 2017-08-21 DIAGNOSIS — Z3400 Encounter for supervision of normal first pregnancy, unspecified trimester: Secondary | ICD-10-CM | POA: Diagnosis present

## 2017-08-21 DIAGNOSIS — Z3403 Encounter for supervision of normal first pregnancy, third trimester: Secondary | ICD-10-CM | POA: Insufficient documentation

## 2017-08-21 LAB — OB RESULTS CONSOLE GC/CHLAMYDIA: GC PROBE AMP, GENITAL: NEGATIVE

## 2017-08-21 NOTE — Progress Notes (Signed)
   PRENATAL VISIT NOTE  Subjective:  Melanie Cruz is a 22 y.o. G1P0 at [redacted]w[redacted]d being seen today for ongoing prenatal care.  She is currently monitored for the following issues for this low-risk pregnancy and has Supervision of normal first pregnancy, antepartum; Rubella non-immune status, antepartum; GBS bacteriuria; Tobacco abuse counseling; Furunculosis of multiple sites; Breast mass; and Yeast vaginitis on their problem list.  Patient reports no complaints.  Contractions: Not present. Vag. Bleeding: None.  Movement: Present. Denies leaking of fluid.   The following portions of the patient's history were reviewed and updated as appropriate: allergies, current medications, past family history, past medical history, past social history, past surgical history and problem list. Problem list updated.  Objective:   Vitals:   08/21/17 0957  BP: 107/63  Pulse: 86  Weight: 231 lb (104.8 kg)    Fetal Status: Fetal Heart Rate (bpm): 140 Fundal Height: 37 cm Movement: Present  Presentation: Vertex  General:  Alert, oriented and cooperative. Patient is in no acute distress.  Skin: Skin is warm and dry. No rash noted.   Cardiovascular: Normal heart rate noted  Respiratory: Normal respiratory effort, no problems with respiration noted  Abdomen: Soft, gravid, appropriate for gestational age.  Pain/Pressure: Present     Pelvic: Cervical exam performed Dilation: Closed   Station: Ballotable  Extremities: Normal range of motion.  Edema: Trace  Mental Status: Normal mood and affect. Normal behavior. Normal judgment and thought content.   Assessment and Plan:  Pregnancy: G1P0 at [redacted]w[redacted]d  1. Supervision of normal first pregnancy, antepartum FHT and FH normal - GC/Chlamydia probe amp ([place])not at [facility]  Preterm labor symptoms and general obstetric precautions including but not limited to vaginal bleeding, contractions, leaking of fluid and fetal movement were reviewed in detail with the  patient. Please refer to After Visit Summary for other counseling recommendations.  No follow-ups on file.  Future Appointments  Date Time Provider [place]  08/28/2017  9:30 AM Lavonia Drafts, MD [facility] None  09/04/2017 10:00 AM Lavonia Drafts, MD [facility] None    Truett Mainland, DO

## 2017-08-22 ENCOUNTER — Encounter: Payer: 59 | Admitting: Family Medicine

## 2017-08-22 LAB — GC/CHLAMYDIA PROBE AMP (~~LOC~~) NOT AT ARMC
Chlamydia: POSITIVE — AB
NEISSERIA GONORRHEA: NEGATIVE

## 2017-08-25 ENCOUNTER — Telehealth: Payer: Self-pay

## 2017-08-25 MED ORDER — AZITHROMYCIN 500 MG PO TABS: 1000.0000 mg | ORAL_TABLET | Freq: Once | ORAL | 1 refills | Status: AC

## 2017-08-25 MED FILL — AZITHROMYCIN 500 MG TABLET: 500 | 1 days supply | Qty: 2 | Fill #0

## 2017-08-25 NOTE — Telephone Encounter (Signed)
-----   Message from Truett Mainland, DO sent at 08/25/2017  8:57 AM EDT ----- Chlamydia positive. Please call patient and treat with azithromycin 1000mg . Can also prescribe expedited partner treatment as well. No sex for 2 weeks after both are treated.

## 2017-08-25 NOTE — Telephone Encounter (Signed)
Patient called and made aware of positive chlamydia result. Patient made aware that it is important to treat this quickly since she is in her third trimester.  Patient made aware that she will have a refill on her prescription for her partner.  Patient made aware that she is to abstain from intercourse for two weeks. And they both must be treated first and then two weeks of no intercourse.   Patient states understanding. Pharmacy verified and medication sent. Kathrene Alu RN

## 2017-08-26 MED FILL — AZITHROMYCIN 500 MG TABLET: 500 | 1 days supply | Qty: 2 | Fill #1

## 2017-08-28 ENCOUNTER — Ambulatory Visit (INDEPENDENT_AMBULATORY_CARE_PROVIDER_SITE_OTHER): Payer: 59 | Admitting: Obstetrics & Gynecology

## 2017-08-28 VITALS — BP 112/62 | HR 102 | Wt 234.0 lb

## 2017-08-28 DIAGNOSIS — Z283 Underimmunization status: Secondary | ICD-10-CM

## 2017-08-28 DIAGNOSIS — O98819 Other maternal infectious and parasitic diseases complicating pregnancy, unspecified trimester: Secondary | ICD-10-CM

## 2017-08-28 DIAGNOSIS — Z2839 Other underimmunization status: Secondary | ICD-10-CM

## 2017-08-28 DIAGNOSIS — O09899 Supervision of other high risk pregnancies, unspecified trimester: Secondary | ICD-10-CM

## 2017-08-28 DIAGNOSIS — Z716 Tobacco abuse counseling: Secondary | ICD-10-CM

## 2017-08-28 DIAGNOSIS — O9989 Other specified diseases and conditions complicating pregnancy, childbirth and the puerperium: Secondary | ICD-10-CM

## 2017-08-28 DIAGNOSIS — R8271 Bacteriuria: Secondary | ICD-10-CM

## 2017-08-28 DIAGNOSIS — Z3400 Encounter for supervision of normal first pregnancy, unspecified trimester: Secondary | ICD-10-CM

## 2017-08-28 DIAGNOSIS — O98813 Other maternal infectious and parasitic diseases complicating pregnancy, third trimester: Secondary | ICD-10-CM

## 2017-08-28 DIAGNOSIS — N63 Unspecified lump in unspecified breast: Secondary | ICD-10-CM

## 2017-08-28 DIAGNOSIS — A749 Chlamydial infection, unspecified: Secondary | ICD-10-CM

## 2017-08-28 NOTE — Patient Instructions (Signed)

## 2017-08-28 NOTE — Progress Notes (Signed)
   PRENATAL VISIT NOTE  Subjective:  Melanie Cruz is a 22 y.o. G1P0 at 40w4dbeing seen today for ongoing prenatal care.  She is currently monitored for the following issues for this low-risk pregnancy and has Supervision of normal first pregnancy, antepartum; Rubella non-immune status, antepartum; GBS bacteriuria; Tobacco abuse counseling; Furunculosis of multiple sites; and Breast mass on their problem list.  Patient reports no complaints.  Contractions: Not present. Vag. Bleeding: Scant.  Movement: Present. Denies leaking of fluid.   The following portions of the patient's history were reviewed and updated as appropriate: allergies, current medications, past family history, past medical history, past social history, past surgical history and problem list. Problem list updated.  Objective:   Vitals:   08/28/17 0939  BP: 112/62  Pulse: (!) 102  Weight: 234 lb (106.1 kg)    Fetal Status: Fetal Heart Rate (bpm): 142   Movement: Present     General:  Alert, oriented and cooperative. Patient is in no acute distress.  Skin: Skin is warm and dry. No rash noted.   Cardiovascular: Normal heart rate noted  Respiratory: Normal respiratory effort, no problems with respiration noted  Abdomen: Soft, gravid, appropriate for gestational age.  Pain/Pressure: Absent     Pelvic: Cervical exam deferred        Extremities: Normal range of motion.  Edema: Trace  Mental Status: Normal mood and affect. Normal behavior. Normal judgment and thought content.   Assessment and Plan:  Pregnancy: G1P0 at 321w4d1. Supervision of normal first pregnancy, antepartum  2. Tobacco abuse counseling rec cessation. Pt counseled to stop everyone from smoking in the house or the car for starters. That is her goal for the week    3. Rubella non-immune status, antepartum Needs MMR PP   4. GBS bacteriuria Needs atbx in labor.   5. Breast mass [place]  6.  chlamydia Treated 08/25/2017. Partner treated Term labor  symptoms and general obstetric precautions including but not limited to vaginal bleeding, contractions, leaking of fluid and fetal movement were reviewed in detail with the patient. Please refer to After Visit Summary for other counseling recommendations.  Return in about 1 week (around 09/04/2017).  Future Appointments  Date Time Provider [place]8/29/2019 10:00 AM HaLavonia DraftsMD [facility] None  09/11/2017 10:00 AM ArWoodroe ModeMD [facility] None    CaLavonia DraftsMD

## 2017-09-04 ENCOUNTER — Ambulatory Visit (INDEPENDENT_AMBULATORY_CARE_PROVIDER_SITE_OTHER): Payer: 59 | Admitting: Obstetrics & Gynecology

## 2017-09-04 VITALS — BP 114/70 | HR 106 | Wt 236.0 lb

## 2017-09-04 DIAGNOSIS — O9989 Other specified diseases and conditions complicating pregnancy, childbirth and the puerperium: Secondary | ICD-10-CM

## 2017-09-04 DIAGNOSIS — Z716 Tobacco abuse counseling: Secondary | ICD-10-CM

## 2017-09-04 DIAGNOSIS — O98813 Other maternal infectious and parasitic diseases complicating pregnancy, third trimester: Secondary | ICD-10-CM

## 2017-09-04 DIAGNOSIS — O368130 Decreased fetal movements, third trimester, not applicable or unspecified: Secondary | ICD-10-CM

## 2017-09-04 DIAGNOSIS — Z283 Underimmunization status: Secondary | ICD-10-CM

## 2017-09-04 DIAGNOSIS — Z3400 Encounter for supervision of normal first pregnancy, unspecified trimester: Secondary | ICD-10-CM

## 2017-09-04 DIAGNOSIS — N63 Unspecified lump in unspecified breast: Secondary | ICD-10-CM

## 2017-09-04 DIAGNOSIS — Z3403 Encounter for supervision of normal first pregnancy, third trimester: Secondary | ICD-10-CM

## 2017-09-04 DIAGNOSIS — Z2839 Other underimmunization status: Secondary | ICD-10-CM

## 2017-09-04 DIAGNOSIS — O09899 Supervision of other high risk pregnancies, unspecified trimester: Secondary | ICD-10-CM

## 2017-09-04 DIAGNOSIS — A749 Chlamydial infection, unspecified: Secondary | ICD-10-CM

## 2017-09-04 DIAGNOSIS — R8271 Bacteriuria: Secondary | ICD-10-CM

## 2017-09-04 NOTE — Patient Instructions (Signed)
BENEFITS OF BREASTFEEDING Many women wonder if they should breastfeed. Research shows that breast milk contains the perfect balance of vitamins, protein and fat that your baby needs to grow. It also contains antibodies that help your baby's immune system to fight off viruses and bacteria and can reduce the risk of sudden infant death syndrome (SIDS). In addition, the colostrum (a fluid secreted from the breast in the first few days after delivery) helps your newborn's digestive system to grow and function well. Breast milk is easier to digest than formula. Also, if your baby is born preterm, breast milk can help to reduce both short- and long-term health problems. BENEFITS OF BREASTFEEDING FOR MOM . Breastfeeding causes a hormone to be released that helps the uterus to contract and return to its normal size more quickly. . It aids in postpartum weight loss, reduces risk of breast and ovarian cancer, heart disease and rheumatoid arthritis. . It decreases the amount of bleeding after the baby is born. benefits of breastfeeding for baby . Provides comfort and nutrition . Protects baby against - Obesity - Diabetes - Asthma - Childhood cancers - Heart disease - Ear infections - Diarrhea - Pneumonia - Stomach problems - Serious allergies - Skin rashes . Promotes growth and development . Reduces the risk of baby having Sudden Infant Death Syndrome (SIDS) only breastmilk for the first 6 months . Protects baby against diseases/allergies . It's the perfect amount for tiny bellies . It restores baby's energy . Provides the best nutrition for baby . Giving water or formula can make baby more likely to get sick, decrease Mom's milk supply, make baby less content with breastfeeding Skin to Skin After delivery, the staff will place your baby on your chest. This helps with the following: . Regulates baby's temperature, breathing, heart rate and blood sugar . Increases Mom's milk supply . Promotes  bonding . Keeps baby and Mom calm and decreases baby's crying Rooming In Your baby will stay in your room with you for the entire time you are in the hospital. This helps with the following: . Allows Mom to learn baby's feeding cues - Fluttering eyes - Sucking on tongue or hand - Rooting (opens mouth and turns head) - Nuzzling into the breast - Bringing hand to mouth . Allows breastfeeding on demand (when your baby is ready) . Helps baby to be calm and content . Ensures a good milk supply . Prevents complications with breastfeeding . Allows parents to learn to care for baby . Allows you to request assistance with breastfeeding Importance of a good latch . Increases milk transfer to baby - baby gets enough milk . Ensures you have enough milk for your baby . Decreases nipple soreness . Don't use pacifiers and bottles - these cause baby to suck differently than breastfeeding . Promotes continuation of breastfeeding Risks of Formula Supplementation with Breastfeeding Giving your infant formula in addition to your breast-milk EXCEPT when medically necessary can lead to: . Decreases your milk supply  . Loss of confidence in yourself for providing baby's nutrition  . Engorgement and possibly mastitis  . Asthma & allergies in the baby BREASTFEEDING FAQS How long should I breastfeed my baby? It is recommended that you provide your baby with breast milk only for the first 6 months and then continue for the first year and longer as desired. During the first few weeks after birth, your baby will need to feed 8-12 times every 24 hours, or every 2-3 hours. They will likely feed   for 15-30 minutes. How can I help my baby begin breastfeeding? Babies are born with an instinct to breastfeed. A healthy baby can begin breastfeeding right away without specific help. At the hospital, a nurse (or lactation consultant) will help you begin the process and will give you tips on good positioning. It may be  helpful to take a breastfeeding class before you deliver in order to know what to expect. How can I help my baby latch on? In order to assist your baby in latching-on, cup your breast in your hand and stroke your baby's lower lip with your nipple to stimulate your baby's rooting reflex. Your baby will look like he or she is yawning, at which point you should bring the baby towards your breast, while aiming the nipple at the roof of his or her mouth. Remember to bring the baby towards you and not your breast towards the baby. How can I tell if my baby is latched-on? Your baby will have all of your nipple and part of the dark area around the nipple in his or her mouth and your baby's nose will be touching your breast. You should see or hear the baby swallowing. If the baby is not latched-on properly, start the process over. To remove the suction, insert a clean finger between your breast and the baby's mouth. Should I switch breasts during feeding? After feeding on one side, switch the baby to your other breast. If he or she does not continue feeding - that is OK. Your baby will not necessarily need to feed from both breasts in a single feeding. On the next feeding, start with the other breast for efficiency and comfort. How can I tell if my baby is hungry? When your baby is hungry, they will nuzzle against your breast, make sucking noises and tongue motions and may put their hands near their mouth. Crying is a late sign of hunger, so you should not wait until this point. When they have received enough milk, they will unlatch from the breast. Is it okay to use a pacifier? Until your baby gets the hang of breastfeeding, experts recommend limiting pacifier usage. If you have questions about this, please contact your pediatrician. What can I do to ensure proper nutrition while breastfeeding? . Make sure that you support your own health and your baby's by eating a healthy, well-balanced diet . Your provider  may recommend that you continue to take your prenatal vitamin . Drink plenty of fluids. It is a good rule to drink one glass of water before or after feeding . Alcohol will remain in the breast milk for as long as it will remain in the blood stream. If you choose to have a drink, it is recommended that you wait at least 2 hours before feeding . Moderate amounts of caffeine are OK . Some over-the-counter or prescription medications are not recommended during breastfeeding. Check with your provider if you have questions What types of birth control methods are safe while breastfeeding?    Natural Childbirth Natural childbirth is going through labor and delivery without any drugs to relieve pain. Additionally, fetal monitors are not used, a delivery is not done, and a surgical cut to enlarge the vaginal opening (episiotomy) is not made. With the help of a birthing professional (midwife or health care provider), you direct your own labor and delivery. Many women choose natural childbirth because it makes them feel more in control and in touch with their labor and delivery. Some woman  also choose natural childbirth because they are concerned about medicines affecting them and their babies. Pregnant women with a high-risk pregnancy should not attempt natural childbirth. It is better to deliver the infant in a hospital if an emergency situation arises. Sometimes, a health care provider has to get involved for the health and safety of the mother and infant. Techniques for natural childbirth  The [place] method-This method teaches parents that having a baby is normal, healthy, and natural. It also teaches the mother to take a neutral position regarding pain medicine and numbing medicines and to make an informed decision about using these medicines when the time comes.  The [place] Fray (also called husband-coached birth)-This method teaches the father or partner to be the birth coach. It encourages the  mother to exercise and eat a balanced, nutritious diet. It also involves relaxation and deep breathing exercises and preparing the parents for emergency situations. Methods of dealing with labor pain and delivery  Meditation.  Yoga.  Hypnosis.  Acupuncture.  Massage.  Changing positions (walking, rocking, showering, leaning on birth balls).  Lying in warm water or a whirlpool bath.  Finding an activity that keeps your mind off of the labor pain.  Listening to soft music.  Focusing on a particular object (visual imagery). Before going into labor  Be sure you and your spouse or partner are in agreement about having a natural childbirth.  Decide if your health care provider or a midwife will deliver your baby.  Decide if you will have your baby in the hospital, at a birthing center, or at home.  If you have children, make plans to have someone take care of them when you go to the hospital or birthing center.  Know the distance and the time it takes to go to the hospital or birthing center. Practice going there and time it to be sure.  Have a bag packed with a nightgown, bathrobe, and toiletries. Be ready to take it with you when you go into labor.  Keep phone numbers of your family and friends handy if you need to call someone when you go into labor.  Your spouse or partner should go to all the natural childbirth technique classes.  Talk with your health care provider about the possibility of a medical emergency and what will happen if that occurs. Advantages of natural childbirth  You are in control of your labor and delivery.  You will not take medicines that could affect you and the baby.  There are no invasive procedures, such as an episiotomy.  You and your spouse or partner will work together, which can increase your bond with each other.  In most delivery centers, your family and friends can be involved in the labor and delivery process. Disadvantages of  natural childbirth  You will experience pain during your labor and delivery.  The methods of helping relieve your labor pains may not work for you.  You may feel disappointed if you decide to change your mind during labor and not have a natural childbirth. After the delivery  You will be very tired.  You will be uncomfortable because of your uterus contracting. You will feel soreness around the vagina.  You may feel cold and shaky. This is a natural reaction. This information is not intended to replace advice given to you by your health care provider. Make sure you discuss any questions you have with your health care provider. Document Released: 12/07/2007 Document Revised: 06/01/2015 Document Reviewed: 08/31/2012 Elsevier  Interactive Patient Education  2017 [place] methods, including a daily pill, an IUD, the implant and the injection are safe while breastfeeding. Methods that contain estrogen (such as combination birth control pills, the vaginal ring and the patch) should not be used during the first month of breastfeeding as these can decrease your milk supply.

## 2017-09-04 NOTE — Progress Notes (Signed)
   PRENATAL VISIT NOTE  Subjective:  Melanie Cruz is a 22 y.o. G1P0 at 42w4dbeing seen today for ongoing prenatal care.  She is currently monitored for the following issues for this low-risk pregnancy and has Supervision of normal first pregnancy, antepartum; Rubella non-immune status, antepartum; GBS bacteriuria; Tobacco abuse counseling; Furunculosis of multiple sites; Breast mass; and Chlamydia infection affecting pregnancy on their problem list.  Patient reports decreased fetal movement.  Contractions: Irritability. Vag. Bleeding: None.  Movement: Present. Denies leaking of fluid.   The following portions of the patient's history were reviewed and updated as appropriate: allergies, current medications, past family history, past medical history, past social history, past surgical history and problem list. Problem list updated.  Objective:   Vitals:   09/04/17 1016  BP: 114/70  Pulse: (!) 106  Weight: 236 lb (107 kg)    Fetal Status: Fetal Heart Rate (bpm): 145   Movement: Present     General:  Alert, oriented and cooperative. Patient is in no acute distress.  Skin: Skin is warm and dry. No rash noted.   Cardiovascular: Normal heart rate noted  Respiratory: Normal respiratory effort, no problems with respiration noted  Abdomen: Soft, gravid, appropriate for gestational age.  Pain/Pressure: Absent     Pelvic: Cervical exam performed        Extremities: Normal range of motion.  Edema: Trace  Mental Status: Normal mood and affect. Normal behavior. Normal judgment and thought content.   Assessment and Plan:  Pregnancy: G1P0 at 384w4d1. Supervision of normal first pregnancy, antepartum Pt reports decreased fetal movement.   NST reviewed and reactive. AFI 12.5  2. Rubella non-immune status, antepartum Needs MMR PP   3. GBS bacteriuria Needs Atbx in labor  4. Chlamydia infection affecting pregnancy in third trimester Ned TOC at 40 weeks  5. Breast mass [place]   6.  Tobacco abuse counseling   Term labor symptoms and general obstetric precautions including but not limited to vaginal bleeding, contractions, leaking of fluid and fetal movement were reviewed in detail with the patient. Please refer to After Visit Summary for other counseling recommendations.  Return in about 1 week (around 09/11/2017).  Future Appointments  Date Time Provider [place]9/05/2017 10:00 AM ArWoodroe ModeMD [facility] None    CaLavonia DraftsMD

## 2017-09-08 ENCOUNTER — Inpatient Hospital Stay (HOSPITAL_COMMUNITY)
Admission: AD | Admit: 2017-09-08 | Discharge: 2017-09-10 | DRG: 807 | Disposition: A | Discharge status: Home / Self Care | Payer: 59 | Attending: Obstetrics and Gynecology | Admitting: Obstetrics and Gynecology

## 2017-09-08 ENCOUNTER — Other Ambulatory Visit: Payer: Self-pay

## 2017-09-08 ENCOUNTER — Inpatient Hospital Stay (HOSPITAL_COMMUNITY): Payer: 59 | Admitting: Anesthesiology

## 2017-09-08 ENCOUNTER — Inpatient Hospital Stay (HOSPITAL_BASED_OUTPATIENT_CLINIC_OR_DEPARTMENT_OTHER): Payer: 59

## 2017-09-08 ENCOUNTER — Encounter (HOSPITAL_COMMUNITY): Payer: Self-pay

## 2017-09-08 DIAGNOSIS — O368390 Maternal care for abnormalities of the fetal heart rate or rhythm, unspecified trimester, not applicable or unspecified: Secondary | ICD-10-CM | POA: Diagnosis present

## 2017-09-08 DIAGNOSIS — Z3A39 39 weeks gestation of pregnancy: Secondary | ICD-10-CM | POA: Diagnosis not present

## 2017-09-08 DIAGNOSIS — Z87891 Personal history of nicotine dependence: Secondary | ICD-10-CM

## 2017-09-08 DIAGNOSIS — Z3A37 37 weeks gestation of pregnancy: Secondary | ICD-10-CM | POA: Diagnosis not present

## 2017-09-08 DIAGNOSIS — O4693 Antepartum hemorrhage, unspecified, third trimester: Secondary | ICD-10-CM

## 2017-09-08 DIAGNOSIS — O99824 Streptococcus B carrier state complicating childbirth: Secondary | ICD-10-CM | POA: Diagnosis present

## 2017-09-08 LAB — HIV ANTIBODY (ROUTINE TESTING W REFLEX): HIV SCREEN 4TH GENERATION: NONREACTIVE

## 2017-09-08 LAB — CBC
HEMATOCRIT: 37.5 % (ref 36.0–46.0)
HEMOGLOBIN: 12 g/dL (ref 12.0–15.0)
MCH: 26.3 pg (ref 26.0–34.0)
MCHC: 32 g/dL (ref 30.0–36.0)
MCV: 82.1 fL (ref 78.0–100.0)
PLATELETS: 163 10*3/uL (ref 150–400)
RBC: 4.57 MIL/uL (ref 3.87–5.11)
RDW: 14.7 % (ref 11.5–15.5)
WBC: 9.7 10*3/uL (ref 4.0–10.5)

## 2017-09-08 LAB — TYPE AND SCREEN
ABO/RH(D): O POS
Antibody Screen: NEGATIVE

## 2017-09-08 LAB — ABO/RH: ABO/RH(D): O POS

## 2017-09-08 LAB — RPR: RPR: NONREACTIVE

## 2017-09-08 MED ORDER — OXYCODONE-ACETAMINOPHEN 5-325 MG PO TABS: 2.0000 | ORAL_TABLET | ORAL | Status: DC | PRN

## 2017-09-08 MED ORDER — OXYCODONE-ACETAMINOPHEN 5-325 MG PO TABS: 1.0000 | ORAL_TABLET | ORAL | Status: DC | PRN

## 2017-09-08 MED ORDER — LIDOCAINE HCL (PF) 1 % IJ SOLN
INTRAMUSCULAR | Status: DC | PRN
  Administered 2017-09-08: 5 mL via EPIDURAL

## 2017-09-08 MED ORDER — OXYTOCIN BOLUS FROM INFUSION
500.0000 mL | Freq: Once | INTRAVENOUS | Status: AC
  Administered 2017-09-09: 500 mL via INTRAVENOUS

## 2017-09-08 MED ORDER — SOD CITRATE-CITRIC ACID 500-334 MG/5ML PO SOLN
30.0000 mL | ORAL | Status: DC | PRN
  Filled 2017-09-08: qty 15

## 2017-09-08 MED ORDER — LACTATED RINGERS IV SOLN: 500.0000 mL | INTRAVENOUS | Status: DC | PRN

## 2017-09-08 MED ORDER — DIPHENHYDRAMINE HCL 50 MG/ML IJ SOLN: 12.5000 mg | INTRAMUSCULAR | Status: DC | PRN

## 2017-09-08 MED ORDER — FENTANYL CITRATE (PF) 100 MCG/2ML IJ SOLN
100.0000 ug | INTRAMUSCULAR | Status: DC | PRN
  Administered 2017-09-08 (×2): 100 ug via INTRAVENOUS
  Filled 2017-09-08 (×3): qty 2

## 2017-09-08 MED ORDER — OXYTOCIN 40 UNITS IN LACTATED RINGERS INFUSION - SIMPLE MED
1.0000 m[IU]/min | INTRAVENOUS | Status: DC
  Administered 2017-09-08: 2 m[IU]/min via INTRAVENOUS

## 2017-09-08 MED ORDER — EPHEDRINE 5 MG/ML INJ
10.0000 mg | INTRAVENOUS | Status: DC | PRN
  Administered 2017-09-08: 10 mg via INTRAVENOUS
  Filled 2017-09-08: qty 2

## 2017-09-08 MED ORDER — BUTORPHANOL TARTRATE 2 MG/ML IJ SOLN
2.0000 mg | INTRAMUSCULAR | Status: DC | PRN
  Administered 2017-09-08: 2 mg via INTRAVENOUS
  Filled 2017-09-08 (×4): qty 2

## 2017-09-08 MED ORDER — TERBUTALINE SULFATE 1 MG/ML IJ SOLN
0.2500 mg | Freq: Once | INTRAMUSCULAR | Status: AC | PRN
  Administered 2017-09-08: 0.25 mg via SUBCUTANEOUS
  Filled 2017-09-08 (×2): qty 1

## 2017-09-08 MED ORDER — LACTATED RINGERS IV SOLN
500.0000 mL | Freq: Once | INTRAVENOUS | Status: AC
  Administered 2017-09-08: 500 mL via INTRAVENOUS

## 2017-09-08 MED ORDER — FENTANYL 2.5 MCG/ML BUPIVACAINE 1/10 % EPIDURAL INFUSION (WH - ANES)
14.0000 mL/h | INTRAMUSCULAR | Status: DC | PRN
  Administered 2017-09-08: 14 mL/h via EPIDURAL
  Filled 2017-09-08: qty 100

## 2017-09-08 MED ORDER — LACTATED RINGERS AMNIOINFUSION
INTRAVENOUS | Status: DC
  Filled 2017-09-08 (×2): qty 1000

## 2017-09-08 MED ORDER — OXYTOCIN 40 UNITS IN LACTATED RINGERS INFUSION - SIMPLE MED
2.5000 [IU]/h | INTRAVENOUS | Status: DC
  Filled 2017-09-08: qty 1000

## 2017-09-08 MED ORDER — PROMETHAZINE HCL 25 MG/ML IJ SOLN
12.5000 mg | INTRAMUSCULAR | Status: DC | PRN
  Administered 2017-09-08: 12.5 mg via INTRAVENOUS
  Filled 2017-09-08: qty 1

## 2017-09-08 MED ORDER — PENICILLIN G 3 MILLION UNITS IVPB - SIMPLE MED
3.0000 10*6.[IU] | INTRAVENOUS | Status: DC
  Administered 2017-09-08 (×4): 3 10*6.[IU] via INTRAVENOUS
  Filled 2017-09-08: qty 100
  Filled 2017-09-08 (×2): qty 3
  Filled 2017-09-08: qty 100
  Filled 2017-09-08 (×2): qty 3
  Filled 2017-09-08 (×2): qty 100

## 2017-09-08 MED ORDER — LACTATED RINGERS IV SOLN
INTRAVENOUS | Status: DC
  Administered 2017-09-08 – 2017-09-09 (×2): via INTRAVENOUS

## 2017-09-08 MED ORDER — SODIUM CHLORIDE 0.9 % IV SOLN
5.0000 10*6.[IU] | Freq: Once | INTRAVENOUS | Status: AC
  Administered 2017-09-08: 5 10*6.[IU] via INTRAVENOUS
  Filled 2017-09-08: qty 5

## 2017-09-08 MED ORDER — ACETAMINOPHEN 325 MG PO TABS: 650.0000 mg | ORAL_TABLET | ORAL | Status: DC | PRN

## 2017-09-08 MED ORDER — LIDOCAINE HCL (PF) 1 % IJ SOLN
30.0000 mL | INTRAMUSCULAR | Status: DC | PRN
  Filled 2017-09-08: qty 30

## 2017-09-08 MED ORDER — ONDANSETRON HCL 4 MG/2ML IJ SOLN: 4.0000 mg | Freq: Four times a day (QID) | INTRAMUSCULAR | Status: DC | PRN

## 2017-09-08 MED ORDER — EPHEDRINE 5 MG/ML INJ
10.0000 mg | INTRAVENOUS | Status: DC | PRN
  Filled 2017-09-08: qty 2
  Filled 2017-09-08: qty 4

## 2017-09-08 MED ORDER — SODIUM BICARBONATE 8.4 % IV SOLN
INTRAVENOUS | Status: DC | PRN
  Administered 2017-09-08 (×2): 4 mL via EPIDURAL

## 2017-09-08 MED ORDER — PHENYLEPHRINE 40 MCG/ML (10ML) SYRINGE FOR IV PUSH (FOR BLOOD PRESSURE SUPPORT)
80.0000 ug | PREFILLED_SYRINGE | INTRAVENOUS | Status: DC | PRN
  Administered 2017-09-08: 80 ug via INTRAVENOUS
  Filled 2017-09-08: qty 5

## 2017-09-08 MED ORDER — MISOPROSTOL 50MCG HALF TABLET
50.0000 ug | ORAL_TABLET | ORAL | Status: DC
  Administered 2017-09-08 (×2): 50 ug via ORAL
  Filled 2017-09-08 (×5): qty 1

## 2017-09-08 MED ORDER — PHENYLEPHRINE 40 MCG/ML (10ML) SYRINGE FOR IV PUSH (FOR BLOOD PRESSURE SUPPORT)
80.0000 ug | PREFILLED_SYRINGE | INTRAVENOUS | Status: DC | PRN
  Filled 2017-09-08: qty 10
  Filled 2017-09-08: qty 5

## 2017-09-08 NOTE — Anesthesia Procedure Notes (Signed)
Epidural Patient location during procedure: OB Start time: 09/08/2017 6:40 PM End time: 09/08/2017 6:50 PM  Staffing Anesthesiologist: Freddrick March, MD Performed: anesthesiologist   Preanesthetic Checklist Completed: patient identified, pre-op evaluation, timeout performed, IV checked, risks and benefits discussed and monitors and equipment checked  Epidural Patient position: sitting Prep: site prepped and draped and DuraPrep Patient monitoring: continuous pulse ox, blood pressure, heart rate and cardiac monitor Approach: midline Location: L3-L4 Injection technique: LOR air  Needle:  Needle type: Tuohy  Needle gauge: 17 G Needle length: 9 cm Needle insertion depth: 7 cm Catheter type: closed end flexible Catheter size: 19 Gauge Catheter at skin depth: 12 cm Test dose: negative  Assessment Sensory level: T8 Events: blood not aspirated, injection not painful, no injection resistance, negative IV test and no paresthesia  Additional Notes Patient identified. Risks/Benefits/Options discussed with patient including but not limited to bleeding, infection, nerve damage, paralysis, failed block, incomplete pain control, headache, blood pressure changes, nausea, vomiting, reactions to medication both or allergic, itching and postpartum back pain. Confirmed with bedside nurse the patient's most recent platelet count. Confirmed with patient that they are not currently taking any anticoagulation, have any bleeding history or any family history of bleeding disorders. Patient expressed understanding and wished to proceed. All questions were answered. Sterile technique was used throughout the entire procedure. Please see nursing notes for vital signs. Test dose was given through epidural catheter and negative prior to continuing to dose epidural or start infusion. Warning signs of high block given to the patient including shortness of breath, tingling/numbness in hands, complete motor block, or  any concerning symptoms with instructions to call for help. Patient was given instructions on fall risk and not to get out of bed. All questions and concerns addressed with instructions to call with any issues or inadequate analgesia.  Reason for block:procedure for pain

## 2017-09-08 NOTE — Progress Notes (Signed)
Patient ID: Melanie Cruz, female   DOB: November 17, 1995, 22 y.o.   MRN: [id number] Melanie Cruz is a 22 y.o. G1P0 at [redacted]w[redacted]d.  Subjective: Foley out around 1800. Requesting epidural.   Objective: BP 98/66   Pulse 95   Temp 98.2 F (36.8 C) (Oral)   Resp 18   Ht 5\' 4"  (1.626 m)   Wt 111.6 kg   LMP 12/08/2016   SpO2 100%   BMI 42.23 kg/m    FHT:  FHR: 130 bpm, variability: mod,  accelerations:  15x15,  decelerations:  [prolonged decel x 3 minutes after epidural. Now reactive UC:  Not tracing well. RN adjusting.  Dilation: 5 Effacement (%): 70 Cervical Position: Posterior Station: -3 Presentation: Vertex Exam by:: Delorise Shiner, RN   SROM clear  Labs: NA  Assessment / Plan: [redacted]w[redacted]d week IUP Labor: Active Fetal Wellbeing:  Category I-II, overall reassuring Pain Control:  Epidural Anticipated MOD:  SVD Augment w/ Pitocin PRN if not in adequate contraction pattern after SROM.  Tamala Julian, [place], [place] 09/08/2017 7:19 PM

## 2017-09-08 NOTE — H&P (Signed)
Melanie Cruz is a 22 y.o. female presenting for vaginal spotting and some uterine cramping.  .  RN Note: Pt reports vaginal spotting that she noticed earlier yesterday. States tonight she went to the bathroom and it was a little more. States the blood is a pinkish-orangish color earlier but tonight it was red. Pt denies LOF. Reports some cramping. Rates 6/10. Reports good fetal movement. Cervix was 1cm on Thursday. Pt denies any intercourse in the last 24 hours.  Patient Active Problem List   Diagnosis Date Noted  . Fetal heart rate decelerations affecting management of mother 09/08/2017  . Chlamydia infection affecting pregnancy 08/28/2017  . Breast mass 04/21/2017  . 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 GBS bacteriuria 03/13/2017  . Tobacco abuse counseling 03/13/2017  . Rubella non-immune status, antepartum 02/17/2017  . Supervision of normal first pregnancy, antepartum 02/13/2017     OB History    Gravida  1   Para      Term      Preterm      AB      Living        SAB      TAB      Ectopic      Multiple      Live Births             Past Medical History:  Diagnosis Date  . Migraine    History reviewed. No pertinent surgical history. Family History: family history includes Breast cancer in her paternal aunt; Hypertension in her mother. Social History:  reports that she has quit smoking. She has a 1.25 pack-year smoking history. She has never used smokeless tobacco. She reports that she drinks alcohol. She reports that she has current or past drug history. Drug: Marijuana.     Maternal Diabetes: No Genetic Screening: Normal Maternal Ultrasounds/Referrals: Normal Fetal Ultrasounds or other Referrals:  None Maternal Substance Abuse:  No Significant Maternal Medications:  None Significant Maternal Lab Results:  Lab values include: Group B Strep positive Other Comments:  None  Review of Systems  Constitutional: Negative for chills and fever.   Gastrointestinal: Positive for abdominal pain. Negative for nausea and vomiting.  Genitourinary: Negative for dysuria.       Vaginal spotting   Musculoskeletal: Negative for back pain.  Neurological: Negative for dizziness and weakness.   Maternal Medical History:  Reason for admission: Contractions and vaginal bleeding.  Nausea.  Contractions: Onset was 1-2 hours ago.   Frequency: irregular.   Perceived severity is mild.    Fetal activity: Perceived fetal activity is normal.   Last perceived fetal movement was within the past hour.    Prenatal complications: Bleeding.   No PIH, infection, pre-eclampsia or preterm labor.   Prenatal Complications - Diabetes: none.    Dilation: 1 Effacement (%): Thick Station: Ballotable Exam by:: Maryagnes Amos RN Blood pressure 126/67, pulse 82, temperature 98.7 F (37.1 C), temperature source Oral, resp. rate 16, height 5\' 4"  (1.626 m), weight 111.6 kg, last menstrual period 12/08/2016, SpO2 99 %. Maternal Exam:  Uterine Assessment: Contraction strength is mild.  Contraction frequency is irregular.   Abdomen: Patient reports no abdominal tenderness. Fetal presentation: vertex  Introitus: Normal vulva. Normal vagina.  Ferning test: not done.  Nitrazine test: not done. Amniotic fluid character: not assessed.  Pelvis: adequate for delivery.   Cervix: Cervix evaluated by digital exam.     Fetal Exam Fetal Monitor Review: Mode: ultrasound.   Baseline rate: 140.  Variability: moderate (  6-25 bpm).   Pattern: accelerations present and variable decelerations.   Moderate variable decels with contractions on admission.  They resolved with position changes.  Fetal State Assessment: Category I - tracings are normal.     Physical Exam  Constitutional: She is oriented to person, place, and time. She appears well-developed and well-nourished. No distress.  HENT:  Head: Normocephalic.  Cardiovascular: Normal rate and regular rhythm.   Respiratory: Effort normal and breath sounds normal.  GI: She exhibits no distension. There is no tenderness. There is no rebound and no guarding.  Genitourinary:  Genitourinary Comments: Dark blood Dilation: 1 Effacement (%): Thick Cervical Position: Posterior Station: Ballotable Presentation: Vertex Exam by:: Maryagnes Amos RN   Musculoskeletal: Normal range of motion.  Neurological: She is alert and oriented to person, place, and time.  Skin: Skin is warm and dry.  Psychiatric: She has a normal mood and affect.    Prenatal labs: ABO, Rh: O/Positive/-- (02/07 1030) Antibody: Negative (02/07 1030) Rubella: 0.90 (04/04 1105) RPR: Non Reactive (06/19 1000)  HBsAg: Negative (02/07 1030)  HIV: Non Reactive (06/19 1000)  GBS:     BPP 8/8  AFI 18.35 cm  Assessment/Plan: Single intrauterine pregnancy at [redacted]w[redacted]d Spotting Fetal heart rate decelerations  Admit to [facility] per consult Dr Roselie Awkward due to unexplained decels and bleeding.  Routine orders Cytotec and Foley    Hansel Feinstein 09/08/2017, 5:28 AM

## 2017-09-08 NOTE — Progress Notes (Signed)
Patient ID: Melanie Cruz, female   DOB: August 15, 1995, 22 y.o.   MRN: [id number] Late Entry:  Earlier in shift FHR had deep variable decelerations, unresponsive to usual maneuvers FSE placed IUPC placed  Amnioinfusion started  FHR recovered for a while  Then decels recurred and usual measures were employed FHR recovered  Dr Glo Herring notified and updated  We gave Terbutaline when she had another prolonged variable deceleration  We also gave meds to support her blood pressure.due to epidural effect Decels did improve afterward.  Will continue to observe for now Dilation: 8.5 Effacement (%): 80 Cervical Position: Posterior Station: -3 Presentation: Vertex Exam by:: Dr. Glo Herring

## 2017-09-08 NOTE — Anesthesia Preprocedure Evaluation (Signed)
Anesthesia Evaluation  Patient identified by MRN, date of birth, ID band Patient awake    Reviewed: Allergy & Precautions, NPO status , Patient's Chart, lab work & pertinent test results  Airway Mallampati: II  TM Distance: >3 FB Neck ROM: Full    Dental no notable dental hx.    Pulmonary neg pulmonary ROS, former smoker,    Pulmonary exam normal breath sounds clear to auscultation       Cardiovascular negative cardio ROS Normal cardiovascular exam Rhythm:Regular Rate:Normal     Neuro/Psych negative neurological ROS  negative psych ROS   GI/Hepatic negative GI ROS, Neg liver ROS,   Endo/Other  negative endocrine ROS  Renal/GU negative Renal ROS  negative genitourinary   Musculoskeletal negative musculoskeletal ROS (+)   Abdominal   Peds  Hematology negative hematology ROS (+)   Anesthesia Other Findings   Reproductive/Obstetrics (+) Pregnancy                             Anesthesia Physical Anesthesia Plan  ASA: II  Anesthesia Plan: Epidural   Post-op Pain Management:    Induction:   PONV Risk Score and Plan: Treatment may vary due to age or medical condition  Airway Management Planned: Natural Airway  Additional Equipment:   Intra-op Plan:   Post-operative Plan:   Informed Consent: I have reviewed the patients History and Physical, chart, labs and discussed the procedure including the risks, benefits and alternatives for the proposed anesthesia with the patient or authorized representative who has indicated his/her understanding and acceptance.     Plan Discussed with: Anesthesiologist  Anesthesia Plan Comments: (Patient identified. Risks, benefits, options discussed with patient including but not limited to bleeding, infection, nerve damage, paralysis, failed block, incomplete pain control, headache, blood pressure changes, nausea, vomiting, reactions to medication,  itching, and post partum back pain. Confirmed with bedside nurse the patient's most recent platelet count. Confirmed with the patient that they are not taking any anticoagulation, have any bleeding history or any family history of bleeding disorders. Patient expressed understanding and wishes to proceed. All questions were answered. )        Anesthesia Quick Evaluation

## 2017-09-08 NOTE — MAU Note (Signed)
Pt reports vaginal spotting that she noticed earlier yesterday. States tonight she went to the bathroom and it was a little more. States the blood is a pinkish-orangish color earlier but tonight it was red. Pt denies LOF. Reports some cramping. Rates 6/10. Reports good fetal movement. Cervix was 1cm on Thursday. Pt denies any intercourse in the last 24 hours.

## 2017-09-08 NOTE — Progress Notes (Signed)
Patient ID: Melanie Cruz, female   DOB: 10-Dec-1995, 22 y.o.   MRN: [id number] Melanie Cruz is a 22 y.o. G1P0 at [redacted]w[redacted]d.  Subjective: More relaxed w/ UC's after pain meds.   Objective: BP 140/86   Pulse 72   Temp 97.9 F (36.6 C) (Axillary)   Resp 16   Ht 5\' 4"  (1.626 m)   Wt 111.6 kg   LMP 12/08/2016   SpO2 100%   BMI 42.23 kg/m    FHT:  FHR: 135  bpm, variability: mod,  accelerations:  15x15,  decelerations:  none UC:   Irreg, mild-mod Dilation: 1 Effacement (%): 50, 60 Cervical Position: Posterior Station: Ballotable Presentation: Vertex Exam by:: Manya Silvas, CNM  Foley placed w/out difficulty. Pt tolerated well.  Labs: Na  Assessment / Plan: [redacted]w[redacted]d week IUP Labor: Early/IOL Fetal Wellbeing:  Category I Pain Control:  IV Pain meds Anticipated MOD:  SVD  Manya Silvas, [place] 09/08/2017 3:46 PM

## 2017-09-08 NOTE — Progress Notes (Signed)
Labor Progress Note Melanie Cruz is a 22 y.o. G1P0 at [redacted]w[redacted]d presented in SOL.  S: Pt is aware that the fetus has shown non reassuring heart rate patterns and that we have made several interventions to help.  Pt is aware the she may have to transition to c-section.  Pt is currently with minimal discomfort with epidural.  O:  BP 115/81   Pulse 91   Temp 98.2 F (36.8 C) (Oral)   Resp 16   Ht 5\' 4"  (1.626 m)   Wt 111.6 kg   LMP 12/08/2016   SpO2 100%   BMI 42.23 kg/m  EFM: 150/moderate var/no accels/early decels and occasional variables  CVE: Dilation: 7 Effacement (%): 80 Cervical Position: Posterior Station: -2 Presentation: Vertex Exam by:: Melanie Cruz CNM   A&P: 22 y.o. G1P0 [redacted]w[redacted]d here with SOL #Labor: Progressing on vaginal cytotec transitioned to oral cytotec.  IUPC placed at 9:30 with 300cc amnioinfusion. Internal fetal heart rate monitor placed at 9:30. #Pain: epidural placed #FWB: category II #GBS negative   Matilde Haymaker, MD 9:41 PM

## 2017-09-08 NOTE — Progress Notes (Signed)
Patient ID: Melanie Cruz, female   DOB: 1995/06/21, 22 y.o.   MRN: [id number] Jozey Janco is a 22 y.o. G1P0 at [redacted]w[redacted]d.  Subjective: Very uncomfortable w/ contractions. Fentanyl not helping. Requesting epidural.   Objective: BP (!) 120/55   Pulse 69   Temp 98.6 F (37 C) (Axillary)   Resp 18   Ht 5\' 4"  (1.626 m)   Wt 111.6 kg   LMP 12/08/2016   SpO2 100%   BMI 42.23 kg/m    FHT:  FHR: 135 bpm, variability: mod,  accelerations:  15x15,  decelerations:  Few variables UC:   irreg, mild-mod Dilation: 1 Effacement (%): 50, 60 Cervical Position: Posterior Station: Ballotable Presentation: Vertex Exam by:: Manya Silvas, CNM  Labs: Results for orders placed or performed during the hospital encounter of 09/08/17 (from the past 24 hour(s))  CBC     Status: None   Collection Time: 09/08/17  5:34 AM  Result Value Ref Range   WBC 9.7 4.0 - 10.5 K/uL   RBC 4.57 3.87 - 5.11 MIL/uL   Hemoglobin 12.0 12.0 - 15.0 g/dL   HCT 37.5 36.0 - 46.0 %   MCV 82.1 78.0 - 100.0 fL   MCH 26.3 26.0 - 34.0 pg   MCHC 32.0 30.0 - 36.0 g/dL   RDW 14.7 11.5 - 15.5 %   Platelets 163 150 - 400 K/uL  Type and screen [place]     Status: None   Collection Time: 09/08/17  5:34 AM  Result Value Ref Range   ABO/RH(D) O POS    Antibody Screen NEG    Sample Expiration      09/11/2017 Performed at [facility], [address] [place], [place] [address]   ABO/Rh     Status: None   Collection Time: 09/08/17  5:34 AM  Result Value Ref Range   ABO/RH(D)      O POS Performed at [facility], [address] [place],  [address]     Assessment / Plan: [redacted]w[redacted]d week IUP Labor: Early/IOL. Minimal progress. Will attempt to place foley and continue Cytotec.  Fetal Wellbeing:  Category I-II Pain Control:  Do not recommend epidural at 1 cm. Will give Stadol and Phenergen Anticipated MOD:  SVD  Tamala Julian [place], [place] 09/08/2017 2:21 PM

## 2017-09-08 NOTE — Anesthesia Pain Management Evaluation Note (Signed)
  CRNA Pain Management Visit Note  Patient: Melanie Cruz, 22 y.o., female  "Hello I am a member of the anesthesia team at [facility]. We have an anesthesia team available at all times to provide care throughout the hospital, including epidural management and anesthesia for C-section. I don't know your plan for the delivery whether it a natural birth, water birth, IV sedation, nitrous supplementation, doula or epidural, but we want to meet your pain goals."   1.Was your pain managed to your expectations on prior hospitalizations?   No prior hospitalizations  2.What is your expectation for pain management during this hospitalization?     Epidural and IV pain meds  3.How can we help you reach that goal?  Possibly an epidural.  Record the patient's initial score and the patient's pain goal.   Pain: 3  Pain Goal: 5 [facility] wants you to be able to say your pain was always managed very well.  Kee Drudge 09/08/2017

## 2017-09-09 ENCOUNTER — Encounter (HOSPITAL_COMMUNITY): Payer: Self-pay | Admitting: *Deleted

## 2017-09-09 DIAGNOSIS — Z3A37 37 weeks gestation of pregnancy: Secondary | ICD-10-CM

## 2017-09-09 DIAGNOSIS — O99824 Streptococcus B carrier state complicating childbirth: Secondary | ICD-10-CM

## 2017-09-09 MED ORDER — ZOLPIDEM TARTRATE 5 MG PO TABS: 5.0000 mg | ORAL_TABLET | Freq: Every evening | ORAL | Status: DC | PRN

## 2017-09-09 MED ORDER — ACETAMINOPHEN 325 MG PO TABS
650.0000 mg | ORAL_TABLET | ORAL | Status: DC | PRN
  Administered 2017-09-09 – 2017-09-10 (×2): 650 mg via ORAL
  Filled 2017-09-09 (×2): qty 2

## 2017-09-09 MED ORDER — DIPHENHYDRAMINE HCL 25 MG PO CAPS: 25.0000 mg | ORAL_CAPSULE | Freq: Four times a day (QID) | ORAL | Status: DC | PRN

## 2017-09-09 MED ORDER — SIMETHICONE 80 MG PO CHEW: 80.0000 mg | CHEWABLE_TABLET | ORAL | Status: DC | PRN

## 2017-09-09 MED ORDER — COCONUT OIL OIL: 1.0000 "application " | TOPICAL_OIL | Status: DC | PRN

## 2017-09-09 MED ORDER — BENZOCAINE-MENTHOL 20-0.5 % EX AERO
1.0000 "application " | INHALATION_SPRAY | CUTANEOUS | Status: DC | PRN
  Administered 2017-09-09: 1 via TOPICAL
  Filled 2017-09-09: qty 56

## 2017-09-09 MED ORDER — IBUPROFEN 600 MG PO TABS
600.0000 mg | ORAL_TABLET | Freq: Four times a day (QID) | ORAL | Status: DC
  Administered 2017-09-09 – 2017-09-10 (×6): 600 mg via ORAL
  Filled 2017-09-09 (×6): qty 1

## 2017-09-09 MED ORDER — DIBUCAINE 1 % RE OINT: 1.0000 "application " | TOPICAL_OINTMENT | RECTAL | Status: DC | PRN

## 2017-09-09 MED ORDER — WITCH HAZEL-GLYCERIN EX PADS: 1.0000 "application " | MEDICATED_PAD | CUTANEOUS | Status: DC | PRN

## 2017-09-09 MED ORDER — ONDANSETRON HCL 4 MG/2ML IJ SOLN: 4.0000 mg | INTRAMUSCULAR | Status: DC | PRN

## 2017-09-09 MED ORDER — TETANUS-DIPHTH-ACELL PERTUSSIS 5-2.5-18.5 LF-MCG/0.5 IM SUSP
0.5000 mL | Freq: Once | INTRAMUSCULAR | Status: AC
  Administered 2017-09-10: 0.5 mL via INTRAMUSCULAR

## 2017-09-09 MED ORDER — SENNOSIDES-DOCUSATE SODIUM 8.6-50 MG PO TABS
2.0000 | ORAL_TABLET | ORAL | Status: DC
  Administered 2017-09-09: 2 via ORAL
  Filled 2017-09-09: qty 2

## 2017-09-09 MED ORDER — ONDANSETRON HCL 4 MG PO TABS: 4.0000 mg | ORAL_TABLET | ORAL | Status: DC | PRN

## 2017-09-09 MED ORDER — PNEUMOCOCCAL VAC POLYVALENT 25 MCG/0.5ML IJ INJ
0.5000 mL | INJECTION | INTRAMUSCULAR | Status: DC
  Filled 2017-09-09: qty 0.5

## 2017-09-09 MED ORDER — PRENATAL MULTIVITAMIN CH
1.0000 | ORAL_TABLET | Freq: Every day | ORAL | Status: DC
  Administered 2017-09-09 – 2017-09-10 (×2): 1 via ORAL
  Filled 2017-09-09 (×2): qty 1

## 2017-09-09 NOTE — Anesthesia Postprocedure Evaluation (Signed)
Anesthesia Post Note  Patient: Karuna Bouie  Procedure(s) Performed: AN AD HOC LABOR EPIDURAL     Patient location during evaluation: Mother Baby Anesthesia Type: Epidural Level of consciousness: awake and alert and oriented Pain management: satisfactory to patient Vital Signs Assessment: post-procedure vital signs reviewed and stable Respiratory status: respiratory function stable Cardiovascular status: stable Postop Assessment: no headache, no backache, epidural receding, patient able to bend at knees, no signs of nausea or vomiting and adequate PO intake Anesthetic complications: no    Last Vitals:  Vitals:   09/09/17 0225 09/09/17 0345  BP: 122/68 (!) 114/59  Pulse: 96 83  Resp: 18 16  Temp: 36.7 C 37.2 C  SpO2: 99% 100%    Last Pain:  Vitals:   09/09/17 0345  TempSrc: Oral  PainSc: 0-No pain   Pain Goal: Patients Stated Pain Goal: 7 (09/08/17 0718)               Katherina Mires

## 2017-09-10 MED ORDER — IBUPROFEN 600 MG PO TABS: 600.0000 mg | ORAL_TABLET | Freq: Three times a day (TID) | ORAL | 0 refills | Status: DC | PRN

## 2017-09-10 MED ORDER — MEASLES, MUMPS & RUBELLA VAC ~~LOC~~ INJ
0.5000 mL | INJECTION | Freq: Once | SUBCUTANEOUS | Status: AC
  Administered 2017-09-10: 0.5 mL via SUBCUTANEOUS
  Filled 2017-09-10 (×2): qty 0.5

## 2017-09-10 NOTE — Lactation Note (Addendum)
This note was copied from a baby's chart. Lactation Consultation Note  Patient Name: Melanie Cruz RDEYC'X Date: 09/10/2017 Reason for consult: Initial assessment;Term;1st time breastfeeding P1, 30 hrs female infant Mom active on [facility] in [employer].  Per mom,  infant had one soiled diaper and 5 wet diapers in past 24 hours. Has been using formula more than BF, per mom BF one time last night she thought she did not have any breast milk. [place] asked mom hand express and colostrum is present in left breast. Mom has large breast with  short shaft and flat nipples . LC had mom due nipple roll, mom has hand pump help extend nipple before latching infant to breast. LC fitted mom w/ 24 mm NS but infant latched better w/out NS.  LC suggested mom latch infant to breast,  Infant after repeated attempts latched and sucked in rthymitic pattern for 12 mins. Mom plans are to BF first then supplement w/ formula afterwards. Mom was please to see milk expressing from her breast. She plans to BF more frequently according infant hunger cues, 8 to 12 times within 24 hours.  LC discussed w/ mom I&O. Reviewed Baby & Me book's Breastfeeding Basics.  Mom made aware of O/P services, breastfeeding support groups, community resources, and our phone # for post-discharge questions.  Maternal Data Formula Feeding for Exclusion: No Has patient been taught Hand Expression?: Yes Does the patient have breastfeeding experience prior to this delivery?: No  Feeding Feeding Type: Breast Fed Nipple Type: Slow - flow Length of feed: 12 min  LATCH Score Latch: Repeated attempts needed to sustain latch, nipple held in mouth throughout feeding, stimulation needed to elicit sucking reflex.  Audible Swallowing: A few with stimulation  Type of Nipple: Everted at rest and after stimulation(short shaft)  Comfort (Breast/Nipple): Soft / non-tender  Hold (Positioning): No assistance needed to correctly position infant at  breast.  LATCH Score: 8  Interventions Interventions: Breast feeding basics reviewed;Support pillows;Position options;Assisted with latch;Skin to skin;Hand express;Breast compression  Lactation Tools Discussed/Used Tools: Pump Breast pump type: Manual WIC Program: Yes   Consult Status Consult Status: Follow-up Date: 09/10/17 Follow-up type: In-patient    Vicente Serene 09/10/2017, 7:06 AM

## 2017-09-10 NOTE — Progress Notes (Addendum)
POSTPARTUM PROGRESS NOTE  Post Partum Day 1 Subjective:  Melanie Cruz is a 22 y.o. G1P1001 [redacted]w[redacted]d s/p SVD.  No acute events overnight.  Pt denies problems with ambulating, voiding or po intake.  She denies nausea or vomiting.  Pain is well controlled. Lochia Moderate.   Objective: Blood pressure 112/68, pulse 73, temperature 98.7 F (37.1 C), temperature source Oral, resp. rate 18, height 5\' 4"  (1.626 m), weight 111.6 kg, last menstrual period 12/08/2016, SpO2 99 %, unknown if currently breastfeeding.  Physical Exam:  General: alert, cooperative and no distress Chest: no respiratory distress Uterine Fundus: firm, appropriately tender DVT Evaluation: No calf swelling or tenderness Extremities: no edema  Recent Labs    09/08/17 0534  HGB 12.0  HCT 37.5    Assessment/Plan:  ASSESSMENT: Melanie Cruz is a 22 y.o. G1P1001 [redacted]w[redacted]d s/p SVD.   - Contraception: Depo - Feeding: breast - Circ: outpt    LOS: 2 days   Rockey Situ MS3 09/10/2017, 7:40 AM

## 2017-09-11 ENCOUNTER — Encounter: Payer: 59 | Admitting: Obstetrics & Gynecology

## 2017-09-13 NOTE — Discharge Summary (Signed)
Obstetrics Discharge Summary OB/GYN Faculty Practice   Patient Name: Melanie Cruz DOB: 10/04/1995 MRN: [id number]  Date of admission: 09/08/2017 Delivering MD: Seabron Spates   Date of discharge: 09/13/2017  Admitting diagnosis: 39 WEEKS BLEEDING Intrauterine pregnancy: [redacted]w[redacted]d     Secondary diagnosis:   Active Problems:   Fetal heart rate decelerations affecting management of mother  Additional problems:  . Term vaginal bleeding . Rubella non-immune . GBS bacteriuria . Chlamydia in pregnancy     Discharge diagnosis: Term Pregnancy Delivered                                            Postpartum procedures: None  Complications: shoulder dystocia (40 seconds)   Hospital course: Melanie Cruz is a 21 y.o. [redacted]w[redacted]d who was admitted for term vaginal bleeding, fetal heart rate decelerations. Her pregnancy was complicated by the above mentioned. Her labor course was notable for induction with FB and cytotec and then transitioning to pitocin. Delivery was complicated by shoulder dystocia. Please see delivery/op note for additional details. Her postpartum course was uncomplicated. She was breastfeeding without difficulty. By day of discharge, she was passing flatus, urinating, eating and drinking without difficulty. Her pain was well-controlled, and she was discharged home with NSAIDs. She will follow-up in clinic in 4-6 weeks.   Physical exam  Vitals:   09/09/17 1214 09/09/17 1545 09/09/17 2210 09/10/17 0636  BP: 127/60 (!) 108/49 (!) 148/82 112/68  Pulse: 91 87 (!) 106 73  Resp: 20 18 20 18   Temp: 98.2 F (36.8 C) 98.6 F (37 C) 98.6 F (37 C) 98.7 F (37.1 C)  TempSrc: Oral Oral Oral Oral  SpO2:  99%    Weight:      Height:       General: well-appearing, NAD Lochia: appropriate Uterine Fundus: firm Incision: N/A DVT Evaluation: No evidence of DVT seen on physical exam. Labs: Lab Results  Component Value Date   WBC 9.7 09/08/2017   HGB 12.0 09/08/2017   HCT 37.5 09/08/2017    MCV 82.1 09/08/2017   PLT 163 09/08/2017   No flowsheet data found.  Discharge instructions: Per After Visit Summary and "Baby and Me Booklet"  After visit meds:  Allergies as of 09/10/2017   No Known Allergies     Medication List    TAKE these medications   acetaminophen 500 MG tablet Commonly known as:  TYLENOL Take 500 mg by mouth every 6 (six) hours as needed for mild pain or headache.   docusate sodium 100 MG capsule Commonly known as:  COLACE Take 1 capsule (100 mg total) by mouth 2 (two) times daily as needed. What changed:  reasons to take this   ibuprofen 600 MG tablet Commonly known as:  ADVIL,MOTRIN Take 1 tablet (600 mg total) by mouth every 8 (eight) hours as needed.   multivitamin-prenatal 27-0.8 MG Tabs tablet Take 1 tablet by mouth daily at 12 noon. Or equivalent.   senna 8.6 MG Tabs tablet Commonly known as:  SENOKOT Take 1 tablet (8.6 mg total) by mouth daily as needed for moderate constipation.       Postpartum contraception: Depo Provera Diet: Routine Diet Activity: Advance as tolerated. Pelvic rest for 6 weeks.   Outpatient follow up:4-6 weeks Future Appointments  Date Time Provider [place]  09/16/2017 11:00 AM Dene Gentry, MD [facility]  10/13/2017 10:30 AM Nehemiah Settle,  Tanna Savoy, DO [facility] None   Newborn Data: Live born female  Birth Weight: 7 lb 11.8 oz (3510 g) APGAR: 6, 9  Newborn Delivery   Birth date/time:  09/09/2017 00:01:00 Delivery type:  Vaginal, Spontaneous    Baby Feeding: Breast Disposition:home with mother  Lambert Mody. Juleen China, DO OB/GYN Fellow, Faculty Practice

## 2017-09-16 ENCOUNTER — Ambulatory Visit (INDEPENDENT_AMBULATORY_CARE_PROVIDER_SITE_OTHER): Payer: 59 | Admitting: Family Medicine

## 2017-09-16 ENCOUNTER — Encounter: Payer: Self-pay | Admitting: Family Medicine

## 2017-09-16 VITALS — BP 122/81 | HR 82 | Ht 64.0 in

## 2017-09-16 DIAGNOSIS — G5603 Carpal tunnel syndrome, bilateral upper limbs: Secondary | ICD-10-CM

## 2017-09-16 NOTE — Patient Instructions (Signed)
You have carpal tunnel syndrome. Wear the wrist braces at nighttime and as often as possible during the day Aspercreme topically up to 4 times a day. Consider Ibuprofen 600mg  three times a day with food OR aleve 2 tabs twice a day with food for pain and inflammation but try to avoid if breastfeeding. A prednisone dose pack is a consideration too. Corticosteroid injection is a consideration to help with pain and inflammation If not improving, will consider nerve conduction studies to assess severity. Follow up with me in 6 weeks for reevaluation.

## 2017-09-18 ENCOUNTER — Telehealth: Payer: Self-pay

## 2017-09-18 NOTE — Telephone Encounter (Signed)
Pt called the office wanting to know if she could take Plan B because she delivered her baby on 09/09/17 and had sex. Informed pt that it is ok to take Plan B.PP appointment was moved up at pt's request to start contraception. Understanding was voiced.

## 2017-09-21 ENCOUNTER — Encounter: Payer: Self-pay | Admitting: Family Medicine

## 2017-09-21 NOTE — Progress Notes (Signed)
PCP: Mackie Pai, PA-C Consultation requested by Dr. Loma Boston  Subjective:   HPI: Patient is a 22 y.o. female here for bilateral wrist/hand pain.  Patient reports since about March/April during her pregnancy she's experienced pain in her wrists radiating into her fingers. Right side worse than left at 5/10 level, sharp. No prior issues. Associated radiation is into bilateral 3rd and 4th digits with numbness. No skin changes. She is now postpartum by about 1 week. Has a right wrist brace but does not have a metal stay on volar side.  Past Medical History:  Diagnosis Date  . Migraine     Current Outpatient Medications on File Prior to Visit  Medication Sig Dispense Refill  . acetaminophen (TYLENOL) 500 MG tablet Take 500 mg by mouth every 6 (six) hours as needed for mild pain or headache.     . docusate sodium (COLACE) 100 MG capsule Take 1 capsule (100 mg total) by mouth 2 (two) times daily as needed. (Patient taking differently: Take 100 mg by mouth 2 (two) times daily as needed for mild constipation. ) 30 capsule 2  . ibuprofen (ADVIL,MOTRIN) 600 MG tablet Take 1 tablet (600 mg total) by mouth every 8 (eight) hours as needed. 30 tablet 0  . Prenatal Vit-Fe Fumarate-FA (MULTIVITAMIN-PRENATAL) 27-0.8 MG TABS tablet Take 1 tablet by mouth daily at 12 noon. Or equivalent. 30 each 11  . senna (SENOKOT) 8.6 MG TABS tablet Take 1 tablet (8.6 mg total) by mouth daily as needed for moderate constipation. 120 each 0   No current facility-administered medications on file prior to visit.     Past Surgical History:  Procedure Laterality Date  . NO PAST SURGERIES      No Known Allergies  Social History   Socioeconomic History  . Marital status: Single    Spouse name: Not on file  . Number of children: Not on file  . Years of education: Not on file  . Highest education level: Not on file  Occupational History  . Not on file  Social Needs  . Financial resource strain: Not  on file  . Food insecurity:    Worry: Not on file    Inability: Not on file  . Transportation needs:    Medical: Not on file    Non-medical: Not on file  Tobacco Use  . Smoking status: Former Smoker    Packs/day: 0.25    Years: 5.00    Pack years: 1.25  . Smokeless tobacco: Never Used  . Tobacco comment: 3 cigs/day  Substance and Sexual Activity  . Alcohol use: Yes    Comment: occasional in past but not since becoming pregnant.  . Drug use: Yes    Types: Marijuana    Comment: occasional-last use 05/23/2017  . Sexual activity: Yes    Birth control/protection: None  Lifestyle  . Physical activity:    Days per week: Not on file    Minutes per session: Not on file  . Stress: Not on file  Relationships  . Social connections:    Talks on phone: Not on file    Gets together: Not on file    Attends religious service: Not on file    Active member of club or organization: Not on file    Attends meetings of clubs or organizations: Not on file    Relationship status: Not on file  . Intimate partner violence:    Fear of current or ex partner: Not on file    Emotionally  abused: Not on file    Physically abused: Not on file    Forced sexual activity: Not on file  Other Topics Concern  . Not on file  Social History Narrative  . Not on file    Family History  Problem Relation Age of Onset  . Hypertension Mother   . Breast cancer Paternal Aunt        age at onset 33's    BP 122/81   Pulse 82   Ht 5\' 4"  (1.626 m)   BMI 42.23 kg/m   Review of Systems: See HPI above.     Objective:  Physical Exam:  Gen: NAD, comfortable in exam room  Right wrist: No deformity, swelling, bruising, atrophy. FROM with 5/5 strength finger abduction, flexion, extension, opposition. No tenderness to palpation. Sensation diminished 3rd. 4th digits. Normal radial pulse, cap refill. Positive phalens, negative tinels  Left wrist: No deformity, swelling, bruising, atrophy. FROM with 5/5  strength finger abduction, flexion, extension, opposition. No tenderness to palpation. Sensation diminished 3rd. 4th digits. Normal radial pulse, cap refill. Positive phalens, negative tinels  MSK u/s:  Median nerve volume on right 0.11 cm2, 0.12cm2 on left Assessment & Plan:  1. Bilateral carpal tunnel syndrome - history, exam, ultrasound consistent with mild carpal tunnel.  Patient has been using a right wrist brace but per her report has been a more flimsy one.  Will use cockup wrist splints at night and as often as possible during the day.  She is breastfeeding - will try to treat without medications but can use aspercreme topically.  Can consider ibuprofen or aleve for severe symptoms.  Consider bilateral injections if not improving as next step.  F/u in 6 weeks.

## 2017-10-02 ENCOUNTER — Encounter: Payer: Self-pay | Admitting: Family Medicine

## 2017-10-02 ENCOUNTER — Ambulatory Visit (INDEPENDENT_AMBULATORY_CARE_PROVIDER_SITE_OTHER): Payer: 59 | Admitting: Family Medicine

## 2017-10-02 VITALS — BP 122/70 | HR 103

## 2017-10-02 DIAGNOSIS — Z3202 Encounter for pregnancy test, result negative: Secondary | ICD-10-CM | POA: Diagnosis not present

## 2017-10-02 DIAGNOSIS — Z3042 Encounter for surveillance of injectable contraceptive: Secondary | ICD-10-CM | POA: Diagnosis not present

## 2017-10-02 LAB — POCT URINE PREGNANCY: PREG TEST UR: NEGATIVE

## 2017-10-02 MED ORDER — MEDROXYPROGESTERONE ACETATE 150 MG/ML IM SUSP: 150.0000 mg | INTRAMUSCULAR | 0 refills | Status: DC

## 2017-10-02 MED ORDER — MEDROXYPROGESTERONE ACETATE 150 MG/ML IM SUSP
150.0000 mg | Freq: Once | INTRAMUSCULAR | Status: AC
  Administered 2017-10-02: 150 mg via INTRAMUSCULAR

## 2017-10-02 MED FILL — medroxyPROGESTERone ACETATE: 150 | 84 days supply | Qty: 1 | Fill #0

## 2017-10-02 NOTE — Progress Notes (Signed)
Patient is three weeks postpartum. Patient desires birth control because she has already had intercourse. Kathrene Alu RN

## 2017-10-02 NOTE — Progress Notes (Signed)
   Subjective:    Patient ID: Melanie Cruz, female    DOB: 07-Apr-1995, 22 y.o.   MRN: [id number]  HPI Patient seen for contraception management. She is 2-3 weeks PP from vaginal delivery. She has already had intercourse and wants to start contraception.   Review of Systems     Objective:   Physical Exam  Constitutional: She is oriented to person, place, and time. She appears well-developed and well-nourished.  Cardiovascular: Normal rate and regular rhythm.  Pulmonary/Chest: Effort normal and breath sounds normal.  Neurological: She is alert and oriented to person, place, and time.  Skin: Skin is warm and dry.      Assessment & Plan:  1. Encounter for surveillance of injectable contraceptive Discussed options - patient would like to do depo today, then nexplanon when the depo expires. Return in 2 weeks for PP visit. - POCT urine pregnancy

## 2017-10-07 ENCOUNTER — Ambulatory Visit: Payer: 59 | Admitting: Advanced Practice Midwife

## 2017-10-13 ENCOUNTER — Ambulatory Visit: Payer: 59 | Admitting: Family Medicine

## 2017-10-16 ENCOUNTER — Ambulatory Visit (INDEPENDENT_AMBULATORY_CARE_PROVIDER_SITE_OTHER): Payer: 59 | Admitting: Family Medicine

## 2017-10-16 ENCOUNTER — Encounter: Payer: Self-pay | Admitting: Family Medicine

## 2017-10-16 DIAGNOSIS — Z1389 Encounter for screening for other disorder: Secondary | ICD-10-CM | POA: Diagnosis not present

## 2017-10-16 NOTE — Progress Notes (Signed)
Subjective:     Melanie Cruz is a 22 y.o. female who presents for a postpartum visit. She is 5 weeks postpartum following a spontaneous vaginal delivery. I have fully reviewed the prenatal and intrapartum course. The delivery was at 63 gestational weeks. Outcome: spontaneous vaginal delivery. Anesthesia: epidural. Postpartum course has been normal. Baby's course has been normal. Baby is feeding by breast. Bleeding Gerber soothe . Bowel function is normal. Bladder function is normal. Patient is sexually active. Contraception method is Depo-Provera injections. Postpartum depression screening: negative.  The following portions of the patient's history were reviewed and updated as appropriate: allergies, current medications, past family history, past medical history, past social history, past surgical history and problem list.  Review of Systems Pertinent items are noted in HPI.   Objective:    There were no vitals taken for this visit.  General:  alert, cooperative and no distress  Lungs: clear to auscultation bilaterally  Heart:  regular rate and rhythm, S1, S2 normal, no murmur, click, rub or gallop  Abdomen: soft, non-tender; bowel sounds normal; no masses,  no organomegaly        Assessment:     normal postpartum exam. Pap smear not done at today's visit.   Plan:    1. Contraception: Depo-Provera injections, may want to switch to nexplanon when due for next depo. 2. Follow up in: 3 months or as needed.

## 2017-10-27 ENCOUNTER — Other Ambulatory Visit: Payer: 59

## 2017-10-28 ENCOUNTER — Ambulatory Visit: Payer: 59 | Admitting: Family Medicine

## 2017-10-28 ENCOUNTER — Other Ambulatory Visit: Payer: 59

## 2017-10-29 ENCOUNTER — Other Ambulatory Visit: Payer: 59 | Admitting: Obstetrics & Gynecology

## 2017-10-29 NOTE — Progress Notes (Unsigned)
Patient reports while having sexual intercourse last night her partner stated he felt something poking him when he went deeper inside her.

## 2017-12-18 ENCOUNTER — Ambulatory Visit (INDEPENDENT_AMBULATORY_CARE_PROVIDER_SITE_OTHER): Payer: 59 | Admitting: Family Medicine

## 2017-12-18 ENCOUNTER — Encounter: Payer: Self-pay | Admitting: Family Medicine

## 2017-12-18 VITALS — BP 115/73 | HR 87 | Ht 64.0 in | Wt 211.0 lb

## 2017-12-18 DIAGNOSIS — Z3042 Encounter for surveillance of injectable contraceptive: Secondary | ICD-10-CM | POA: Diagnosis not present

## 2017-12-18 DIAGNOSIS — Z113 Encounter for screening for infections with a predominantly sexual mode of transmission: Secondary | ICD-10-CM

## 2017-12-18 MED ORDER — MEDROXYPROGESTERONE ACETATE 150 MG/ML IM SUSP: 150.0000 mg | INTRAMUSCULAR | 3 refills | Status: DC

## 2017-12-18 MED ORDER — MEDROXYPROGESTERONE ACETATE 150 MG/ML IM SUSP
150.0000 mg | Freq: Once | INTRAMUSCULAR | Status: AC
  Administered 2017-12-18: 150 mg via INTRAMUSCULAR

## 2017-12-18 NOTE — Progress Notes (Signed)
   Subjective:    Patient ID: Melanie Cruz, female    DOB: 1995-02-05, 22 y.o.   MRN: [id number]  HPI  Patient seen for surveillence of contraception. Currently using depo. No concerns or problems.   Review of Systems     Objective:   Physical Exam Cardiovascular:     Rate and Rhythm: Normal rate.     Pulses: Normal pulses.  Pulmonary:     Effort: Pulmonary effort is normal.  Skin:    General: Skin is warm and dry.  Neurological:     General: No focal deficit present.     Mental Status: She is alert and oriented to person, place, and time.        Assessment & Plan:  1. Screening for STD (sexually transmitted disease) - HIV antibody (with reflex) - RPR - Hepatitis C Antibody - Hepatitis B Surface AntiGEN  2. Encounter for surveillance of injectable contraceptive - medroxyPROGESTERone (DEPO-PROVERA) injection 150 mg - medroxyPROGESTERone (DEPO-PROVERA) 150 MG/ML injection; Inject 1 mL (150 mg total) into the muscle every 3 (three) months.  Dispense: 1 mL; Refill: 3

## 2017-12-19 LAB — HIV ANTIBODY (ROUTINE TESTING W REFLEX): HIV SCREEN 4TH GENERATION: NONREACTIVE

## 2017-12-19 LAB — RPR: RPR Ser Ql: NONREACTIVE

## 2017-12-19 LAB — HEPATITIS C ANTIBODY

## 2017-12-19 LAB — HEPATITIS B SURFACE ANTIGEN: HEP B S AG: NEGATIVE

## 2018-01-30 ENCOUNTER — Ambulatory Visit: Payer: 59 | Admitting: Family Medicine

## 2018-01-30 DIAGNOSIS — Z113 Encounter for screening for infections with a predominantly sexual mode of transmission: Secondary | ICD-10-CM

## 2018-02-06 ENCOUNTER — Encounter: Payer: Self-pay | Admitting: Medical

## 2018-02-06 ENCOUNTER — Ambulatory Visit (INDEPENDENT_AMBULATORY_CARE_PROVIDER_SITE_OTHER): Payer: Self-pay | Admitting: Medical

## 2018-02-06 VITALS — BP 111/73 | HR 78 | Temp 98.3°F | Resp 16 | Ht 64.0 in | Wt 215.2 lb

## 2018-02-06 DIAGNOSIS — F17200 Nicotine dependence, unspecified, uncomplicated: Secondary | ICD-10-CM

## 2018-02-06 DIAGNOSIS — J329 Chronic sinusitis, unspecified: Secondary | ICD-10-CM

## 2018-02-06 DIAGNOSIS — R05 Cough: Secondary | ICD-10-CM

## 2018-02-06 DIAGNOSIS — R059 Cough, unspecified: Secondary | ICD-10-CM

## 2018-02-06 DIAGNOSIS — Z889 Allergy status to unspecified drugs, medicaments and biological substances status: Secondary | ICD-10-CM

## 2018-02-06 MED ORDER — BUPROPION HCL ER (XL) 150 MG PO TB24: 150.0000 mg | ORAL_TABLET | Freq: Every day | ORAL | 0 refills | Status: DC

## 2018-02-06 MED ORDER — LEVOCETIRIZINE DIHYDROCHLORIDE 5 MG PO TABS: 5.0000 mg | ORAL_TABLET | Freq: Every evening | ORAL | 0 refills | Status: DC

## 2018-02-06 MED ORDER — BENZONATATE 100 MG PO CAPS: 100.0000 mg | ORAL_CAPSULE | Freq: Three times a day (TID) | ORAL | 0 refills | Status: DC | PRN

## 2018-02-06 MED ORDER — FLUTICASONE PROPIONATE 50 MCG/ACT NA SUSP: 2.0000 | Freq: Every day | NASAL | 1 refills | Status: DC

## 2018-02-06 NOTE — Progress Notes (Signed)
Subjective:    Patient ID: Melanie Cruz, female    DOB: 02/16/1995, 23 y.o.   MRN: [id number]  HPI  Pt in with some cough for about one week. Slight itchiness to throat. No wheezing.  No fever, no chills or sweats. No myalgias. No sneezing.Pt does not think she has pnd.  Pt did have hx of asthma but when young. Not having to use any inhalers.  Pt has 51 month old and he is fed with formula.    Review of Systems  Constitutional: Negative for chills, fatigue and fever.  HENT: Positive for postnasal drip. Negative for congestion, ear pain, sinus pressure and sinus pain.        Some on exam.  Respiratory: Positive for cough. Negative for chest tightness, shortness of breath and wheezing.        Cough is occaisonal.  Cardiovascular: Negative for chest pain and palpitations.  Gastrointestinal: Negative for abdominal pain.  Musculoskeletal: Negative for back pain.  Neurological: Negative for dizziness, speech difficulty, weakness and light-headedness.  Hematological: Negative for adenopathy. Does not bruise/bleed easily.  Psychiatric/Behavioral: Negative for behavioral problems and confusion.    Past Medical History:  Diagnosis Date  . Migraine      Social History   Socioeconomic History  . Marital status: Single    Spouse name: Not on file  . Number of children: Not on file  . Years of education: Not on file  . Highest education level: Not on file  Occupational History  . Not on file  Social Needs  . Financial resource strain: Not on file  . Food insecurity:    Worry: Not on file    Inability: Not on file  . Transportation needs:    Medical: Not on file    Non-medical: Not on file  Tobacco Use  . Smoking status: Former Smoker    Packs/day: 0.25    Years: 5.00    Pack years: 1.25  . Smokeless tobacco: Never Used  . Tobacco comment: 3 cigs/day  Substance and Sexual Activity  . Alcohol use: Yes    Comment: occasional in past but not since becoming pregnant.  .  Drug use: Yes    Types: Marijuana    Comment: occasional-last use 05/23/2017  . Sexual activity: Yes    Birth control/protection: None  Lifestyle  . Physical activity:    Days per week: Not on file    Minutes per session: Not on file  . Stress: Not on file  Relationships  . Social connections:    Talks on phone: Not on file    Gets together: Not on file    Attends religious service: Not on file    Active member of club or organization: Not on file    Attends meetings of clubs or organizations: Not on file    Relationship status: Not on file  . Intimate partner violence:    Fear of current or ex partner: Not on file    Emotionally abused: Not on file    Physically abused: Not on file    Forced sexual activity: Not on file  Other Topics Concern  . Not on file  Social History Narrative  . Not on file    Past Surgical History:  Procedure Laterality Date  . NO PAST SURGERIES      Family History  Problem Relation Age of Onset  . Hypertension Mother   . Breast cancer Paternal Aunt        age at  onset 30's    No Known Allergies  Current Outpatient Medications on File Prior to Visit  Medication Sig Dispense Refill  . medroxyPROGESTERone (DEPO-PROVERA) 150 MG/ML injection Inject 1 mL (150 mg total) into the muscle every 3 (three) months. 1 mL 3   No current facility-administered medications on file prior to visit.     BP 111/73   Pulse 78   Temp 98.3 F (36.8 C) (Oral)   Resp 16   Ht 5\' 4"  (1.626 m)   Wt 215 lb 3.2 oz (97.6 kg)   SpO2 100%   BMI 36.94 kg/m      Objective:   Physical Exam   General  Mental Status - Alert. General Appearance - Well groomed. Not in acute distress.  Skin Rashes- No Rashes.  HEENT Head- Normal. Ear Auditory Canal - Left- Normal. Right - Normal.Tympanic Membrane- Left- Normal. Right- Normal. Eye Sclera/Conjunctiva- Left- Normal. Right- Normal. Nose & Sinuses Nasal Mucosa- Left-   Faint Boggy and Congested. Right-  Faint   Boggy and  Congested.Bilateral no maxillary and no  frontal sinus pressure. Mouth & Throat Lips: Upper Lip- Normal: no dryness, cracking, pallor, cyanosis, or vesicular eruption. Lower Lip-Normal: no dryness, cracking, pallor, cyanosis or vesicular eruption. Buccal Mucosa- Bilateral- No Aphthous ulcers. Oropharynx- No Discharge or Erythema. Tonsils: Characteristics- Bilateral- No Erythema or Congestion. Size/Enlargement- Bilateral- No enlargement. Discharge- bilateral-None.  Neck Neck- Supple. No Masses.   Chest and Lung Exam Auscultation: Breath Sounds:-Clear even and unlabored.  Cardiovascular Auscultation:Rythm- Regular, rate and rhythm. Murmurs & Other Heart Sounds:Ausculatation of the heart reveal- No Murmurs.  Lymphatic Head & Neck General Head & Neck Lymphatics: Bilateral: Description- No Localized lymphadenopathy.      Assessment & Plan:  You do appear to have probable mild early upper respiratory infection versus allergic rhinitis.  Exam reveals some mild postnasal drainage and minimal nasal congestion.  No obvious signs or symptoms of bacterial infection.  No flulike syndrome.  Will prescribe Xyzal antihistamine and benzonatate to use if needed for cough.  If nasal congestion occurs then can start Flonase nasal spray.  Print prescription given.  If signs and symptoms worsen or change please let us know.  For discuss smoking cessation today and manner to quit smoking with Wellbutrin.  Patient has no contraindications to Wellbutrin.  She is willing to try.  Sent prescription to pharmacy.(For smoking cessation asked her to follow-up in 1 month.)  Follow-up in 7 days or as needed for persisting or worsening signs/ symptoms.  Mackie Pai, PA-C

## 2018-02-06 NOTE — Patient Instructions (Addendum)
You do appear to have probable mild early upper respiratory infection versus allergic rhinitis.  Exam reveals some mild postnasal drainage and minimal nasal congestion.  No obvious signs or symptoms of bacterial infection.  No flulike syndrome.  Will prescribe Xyzal antihistamine and benzonatate to use if needed for cough.  If nasal congestion occurs then can start Flonase nasal spray.  Print prescription given.  If signs and symptoms worsen or change please let us know.  For discuss smoking cessation today and manner to quit smoking with Wellbutrin.  Patient has no contraindications to Wellbutrin.  She is willing to try.  Sent prescription to pharmacy.(For smoking cessation asked her to follow-up in 1 month.)  Follow-up in 7 days or as needed for persisting or worsening signs/ symptoms.

## 2018-03-05 ENCOUNTER — Ambulatory Visit: Payer: 59 | Admitting: Family Medicine

## 2018-03-05 ENCOUNTER — Ambulatory Visit: Payer: Self-pay | Admitting: Medical

## 2018-03-19 ENCOUNTER — Other Ambulatory Visit: Payer: Self-pay

## 2018-03-19 ENCOUNTER — Ambulatory Visit: Payer: Self-pay | Admitting: Family Medicine

## 2018-04-21 ENCOUNTER — Other Ambulatory Visit: Payer: Self-pay

## 2018-04-21 ENCOUNTER — Ambulatory Visit (INDEPENDENT_AMBULATORY_CARE_PROVIDER_SITE_OTHER): Payer: Self-pay | Admitting: Medical

## 2018-04-21 ENCOUNTER — Encounter: Payer: Self-pay | Admitting: Medical

## 2018-04-21 DIAGNOSIS — T7840XA Allergy, unspecified, initial encounter: Secondary | ICD-10-CM

## 2018-04-21 DIAGNOSIS — Z3009 Encounter for other general counseling and advice on contraception: Secondary | ICD-10-CM

## 2018-04-21 DIAGNOSIS — L739 Follicular disorder, unspecified: Secondary | ICD-10-CM

## 2018-04-21 DIAGNOSIS — F17200 Nicotine dependence, unspecified, uncomplicated: Secondary | ICD-10-CM

## 2018-04-21 MED ORDER — PREDNISONE 10 MG PO TABS: ORAL_TABLET | ORAL | 0 refills | Status: DC

## 2018-04-21 MED ORDER — CEPHALEXIN 500 MG PO CAPS: 500.0000 mg | ORAL_CAPSULE | Freq: Two times a day (BID) | ORAL | 0 refills | Status: DC

## 2018-04-21 MED ORDER — BUPROPION HCL ER (XL) 150 MG PO TB24: 150.0000 mg | ORAL_TABLET | Freq: Every day | ORAL | 0 refills | Status: DC

## 2018-04-21 NOTE — Patient Instructions (Signed)
By recent history and inspection of the scan it does appear the there are some follicles inflamed and areas described.  Also some itching reported so considering a folliculitis with mild allergic reaction.  Though on review allergic reaction type symptoms are not severe and no obvious source identified.  Will prescribe patient 4-day taper dose of prednisone and 7day course of cephalexin.  Patient is a smoker and wants to stop smoking so also prescribed her Wellbutrin.  In addition patient wants to be on birth control.  Formally she was on Depo-Medrol but ran out of insurance.  Patient is not in the office today but this was a video visit so explained wanted her to get over-the-counter pregnancy test.  She states that she already has some in the house do a pregnancy test later and update me on the results tomorrow morning.  If test is negative then I will send a prescription of OCP.  Patient expressed understanding.  Follow-up date to be determined depending on how she does.  I did ask her to give [place] an update in about 5 to 7 days as to how her skin is doing.

## 2018-04-21 NOTE — Progress Notes (Addendum)
Subjective:    Patient ID: Melanie Cruz, female    DOB: 12-01-1995, 23 y.o.   MRN: [id number]  HPI  Virtual Visit via Video Note  I connected with Melanie Cruz on 04/21/18 at  2:00 PM EDT by a video enabled telemedicine application and verified that I am speaking with the correct person using two identifiers.   I discussed the limitations of evaluation and management by telemedicine and the availability of in person appointments. The patient expressed understanding and agreed to proceed.  Pt wants to start wellbutrin to quit smoking.       History of Present Illness: Pt states has ras on upper chest, back neck, and upper arms. No severe itching but itches occassionally. Denies any severe sweating. These area are present for only 3-4 days. No new medication that she started in past week. Occasional very mild faint itch. No lip swelling and no sob. Pt has some bumps on her forehead as well.    No new detergents, soaps or cream. Though recently used new air product.  LMP- spotted some in February, Baby is 62 months old. Pt was on depomedrol. She was going to get depomedrol injection past month but ran out of insurance.  Pt wants to start wellbutrin to quit smoking.     Observations/Objective:   Assessment and Plan: By recent history and inspection of the scan it does appear the there are some follicles inflamed and areas described.  Also some itching reported so considering a folliculitis with mild allergic reaction.  Though on review allergic reaction type symptoms are not severe and no obvious source identified.  Will prescribe patient 4-day taper dose of prednisone and 7day course of cephalexin.  Patient is a smoker and wants to stop smoking so also prescribed her Wellbutrin.  In addition patient wants to be on birth control.  Formally she was on Depo-Medrol but ran out of insurance.  Patient is not in the office today but this was a video visit so explained wanted her to get  over-the-counter pregnancy test.  She states that she already has some in the house do a pregnancy test later and update me on the results tomorrow morning.  If test is negative then I will send a prescription of OCP.  Patient expressed understanding.  Follow-up date to be determined depending on how she does.  I did ask her to give [place] an update in about 5 to 7 days as to how her skin is doing.  Follow Up Instructions:    I discussed the assessment and treatment plan with the patient. The patient was provided an opportunity to ask questions and all were answered. The patient agreed with the plan and demonstrated an understanding of the instructions.   The patient was advised to call back or seek an in-person evaluation if the symptoms worsen or if the condition fails to improve as anticipated.  I provided 25 minutes of non-face-to-face time during this encounter.   Mackie Pai, PA-C    Review of Systems  Respiratory: Negative for cough, chest tightness and wheezing.   Cardiovascular: Negative for chest pain and palpitations.  Gastrointestinal: Negative for abdominal pain.  Genitourinary: Negative for dysuria and frequency.  Musculoskeletal: Negative for back pain.  Skin: Positive for rash.  Neurological: Negative for dizziness, seizures, syncope, weakness and headaches.  Psychiatric/Behavioral: Negative for behavioral problems, hallucinations, sleep disturbance and suicidal ideas. The patient is not nervous/anxious and is not hyperactive.        Objective:  Physical Exam  No acute distress. Faint rash seen on her forehead, upper chest, back of neck and arms. Follicles look mild inflammed.      Assessment & Plan:  By recent history and inspection of the scan it does appear the there are some follicles inflamed and areas described.  Also some itching reported so considering a folliculitis with mild allergic reaction.  Though on review allergic reaction type symptoms are not  severe and no obvious source identified.  Will prescribe patient 4-day taper dose of prednisone and 7day course of cephalexin.  Patient is a smoker and wants to stop smoking so also prescribed her Wellbutrin.  In addition patient wants to be on birth control.  Formally she was on Depo-Medrol but ran out of insurance.  Patient is not in the office today but this was a video visit so explained wanted her to get over-the-counter pregnancy test.  She states that she already has some in the house do a pregnancy test later and update me on the results tomorrow morning.  If test is negative then I will send a prescription of OCP.  Patient expressed understanding.  Follow-up date to be determined depending on how she does.  I did ask her to give [place] an update in about 5 to 7 days as to how her skin is doing.  Pt sent me my chart message that her at home preg test came back negative. So will send in rx for ocp.  Mackie Pai, PA-C

## 2018-04-22 ENCOUNTER — Encounter: Payer: Self-pay | Admitting: Medical

## 2018-04-22 MED ORDER — NORGESTIM-ETH ESTRAD TRIPHASIC 0.18/0.215/0.25 MG-25 MCG PO TABS: 1.0000 | ORAL_TABLET | Freq: Every day | ORAL | 11 refills | Status: DC

## 2018-04-22 NOTE — Addendum Note (Signed)
Addended by: Anabel Halon on: 04/22/2018 08:41 AM   Modules accepted: Orders

## 2018-04-23 ENCOUNTER — Encounter: Payer: Self-pay | Admitting: Medical

## 2018-04-29 ENCOUNTER — Encounter (HOSPITAL_BASED_OUTPATIENT_CLINIC_OR_DEPARTMENT_OTHER): Payer: Self-pay | Admitting: *Deleted

## 2018-04-29 ENCOUNTER — Other Ambulatory Visit: Payer: Self-pay

## 2018-04-29 ENCOUNTER — Encounter: Payer: Self-pay | Admitting: Medical

## 2018-04-29 ENCOUNTER — Emergency Department (HOSPITAL_BASED_OUTPATIENT_CLINIC_OR_DEPARTMENT_OTHER)
Admission: EM | Admit: 2018-04-29 | Discharge: 2018-04-29 | Disposition: A | Discharge status: Home / Self Care | Payer: Self-pay | Attending: Emergency Medicine | Admitting: Emergency Medicine

## 2018-04-29 DIAGNOSIS — Z79899 Other long term (current) drug therapy: Secondary | ICD-10-CM | POA: Insufficient documentation

## 2018-04-29 DIAGNOSIS — R21 Rash and other nonspecific skin eruption: Secondary | ICD-10-CM | POA: Insufficient documentation

## 2018-04-29 DIAGNOSIS — F17210 Nicotine dependence, cigarettes, uncomplicated: Secondary | ICD-10-CM | POA: Insufficient documentation

## 2018-04-29 LAB — PREGNANCY, URINE: Preg Test, Ur: NEGATIVE

## 2018-04-29 MED ORDER — DEXAMETHASONE 6 MG PO TABS
10.0000 mg | ORAL_TABLET | Freq: Once | ORAL | Status: AC
  Administered 2018-04-29: 13:00:00 10 mg via ORAL
  Filled 2018-04-29: qty 1

## 2018-04-29 NOTE — ED Triage Notes (Addendum)
Rash that started on her chest a week ago and pt noticed that it was spreading slowly to her back, BUE, abdomen and bilateral upper thigh.  It itches sometimes.  Denies of any food that she is allergic to.  She stated that she was seen by PCP last week and was prescribed with Prednisone and antibiotic with no relief.

## 2018-04-29 NOTE — ED Provider Notes (Signed)
[facility] DEPART[facility] Provider Note   CSN: [id number] Arrival date & time: 04/29/18  1143    History   Chief Complaint Chief Complaint  Patient presents with  . Rash    HPI Melanie Cruz is a 23 y.o. female.     HPI   Presents with concern for rash which began one hour ago. Began on the chest and spread to back, upper extremities, abdomen and groin. Mild itching. No pain. No fevers, no cough, no congestion, no sore throat.  No contacts in home with similar symptoms.  Had virtual visit where she was given keflex for poss folliculitis and prednisone.    Past Medical History:  Diagnosis Date  . Migraine     Patient Active Problem List   Diagnosis Date Noted  . 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 Tobacco abuse counseling 03/13/2017    Past Surgical History:  Procedure Laterality Date  . NO PAST SURGERIES       OB History    Gravida  2   Para  1   Term  1   Preterm      AB  1   Living  1     SAB      TAB      Ectopic      Multiple  0   Live Births  1            Home Medications    Prior to Admission medications   Medication Sig Start Date End Date Taking? Authorizing Provider  buPROPion (WELLBUTRIN XL) 150 MG 24 hr tablet Take 1 tablet (150 mg total) by mouth daily. 04/21/18   Saguier, Percell Miller, PA-C  cephALEXin (KEFLEX) 500 MG capsule Take 1 capsule (500 mg total) by mouth 2 (two) times daily. 04/21/18   Saguier, Percell Miller, PA-C  Norgestimate-Ethinyl Estradiol Triphasic 0.18/0.215/0.25 MG-25 MCG tab Take 1 tablet by mouth daily. 04/22/18   Saguier, Percell Miller, PA-C  predniSONE (DELTASONE) 10 MG tablet 4 tab po day 1, 3 tab po day 2, 2 tab po day 3, 1 tab po day 4 04/21/18   Saguier, Percell Miller, PA-C    Family History Family History  Problem Relation Age of Onset  . Hypertension Mother   . Breast cancer Paternal Aunt        age at onset 90's    Social History Social History   Tobacco Use  . Smoking status: Current  Every Day Smoker    Packs/day: 0.50    Years: 5.00    Pack years: 2.50    Types: Cigarettes  . Smokeless tobacco: Never Used  Substance Use Topics  . Alcohol use: Yes    Comment: occasional in past but not since becoming pregnant.  . Drug use: Not Currently    Types: Marijuana    Comment: occasional-last use 05/23/2017     Allergies   Patient has no known allergies.   Review of Systems Review of Systems  Constitutional: Negative for fever.  HENT: Negative for congestion and sore throat.   Respiratory: Negative for cough and shortness of breath.   Cardiovascular: Negative for chest pain.  Gastrointestinal: Negative for abdominal pain, diarrhea, nausea and vomiting.  Skin: Positive for rash.  Neurological: Negative for headaches.     Physical Exam Updated Vital Signs BP 130/71 (BP Location: Right Arm)   Pulse 93   Temp 98.5 F (36.9 C) (Oral)   Resp 16   Ht 5\' 5"  (1.651 m)   Wt 97.1 kg  LMP 02/18/2018 (Approximate)   SpO2 99%   BMI 35.61 kg/m   Physical Exam Vitals signs and nursing note reviewed.  Constitutional:      General: She is not in acute distress.    Appearance: She is well-developed. She is not diaphoretic.  HENT:     Head: Normocephalic and atraumatic.  Eyes:     Conjunctiva/sclera: Conjunctivae normal.  Neck:     Musculoskeletal: Normal range of motion.  Cardiovascular:     Rate and Rhythm: Normal rate and regular rhythm.  Pulmonary:     Effort: Pulmonary effort is normal. No respiratory distress.  Abdominal:     General: There is no distension.  Musculoskeletal:        General: No tenderness.  Skin:    General: Skin is warm and dry.     Findings: Rash (scattered tiny papules over chest, back, abdomen, arms, groin. Not in we spaces, a few areas healed iwth darker macules, no cellulitis, abscess nor pustules) present. No erythema.  Neurological:     Mental Status: She is alert and oriented to person, place, and time.      ED  Treatments / Results  Labs (all labs ordered are listed, but only abnormal results are displayed) Labs Reviewed  PREGNANCY, URINE    EKG None  Radiology No results found.  Procedures Procedures (including critical care time)  Medications Ordered in ED Medications  dexamethasone (DECADRON) tablet 10 mg (10 mg Oral Given 04/29/18 1238)     Initial Impression / Assessment and Plan / ED Course  I have reviewed the triage vital signs and the nursing notes.  Pertinent labs & imaging results that were available during my care of the patient were reviewed by me and considered in my medical decision making (see chart for details).        23yo female presents with concern for rash.  Rash does not have the appearance of SSS, TEN, erythroderma, RMSF or hives.  Possible scabies but does not involve web spaces and no similar rash in the home making this much less likely. Has not had sore throat or other symptoms to suggest scarlet fever. Consider pityriasis rosea, but overall suspect more likely contact dermatitis or allergic pathology. Given dose of decadron, recommend benadryl prn, follow up with dermatology if symptoms continue.    Final Clinical Impressions(s) / ED Diagnoses   Final diagnoses:  Rash    ED Discharge Orders    None       Gareth Morgan, MD 04/29/18 2201

## 2018-04-29 NOTE — ED Notes (Signed)
ED Provider at bedside. 

## 2018-04-29 NOTE — Discharge Instructions (Signed)
Take benadryl 25mg  every 6 hours as needed for itching.

## 2018-05-26 ENCOUNTER — Encounter: Payer: Self-pay | Admitting: Medical

## 2018-10-19 ENCOUNTER — Encounter: Payer: Self-pay | Admitting: Medical

## 2018-10-20 ENCOUNTER — Other Ambulatory Visit: Payer: Self-pay

## 2018-10-20 ENCOUNTER — Encounter: Payer: Self-pay | Admitting: Medical

## 2018-10-20 ENCOUNTER — Ambulatory Visit: Payer: Self-pay | Admitting: Medical

## 2018-10-20 NOTE — Progress Notes (Signed)
   Subjective:    Patient ID: Melanie Cruz, female    DOB: May 24, 1995, 23 y.o.   MRN: [id number]  HPI  No charge. Pt called pec to cancel appointment. 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 She has not symptoms and just did not want to proceed with visit. She is cash pay so I understand. If has desire to lead level test or symptoms can rescheduled. No charg.  Location:       History of Present Illness:    Observations/Objective:   Assessment and Plan:   Follow Up Instructions:      Review of Systems     Objective:   Physical Exam        Assessment & Plan:

## 2018-11-03 ENCOUNTER — Encounter (HOSPITAL_BASED_OUTPATIENT_CLINIC_OR_DEPARTMENT_OTHER): Payer: Self-pay | Admitting: Emergency Medicine

## 2018-11-03 ENCOUNTER — Other Ambulatory Visit: Payer: Self-pay

## 2018-11-03 ENCOUNTER — Emergency Department (HOSPITAL_BASED_OUTPATIENT_CLINIC_OR_DEPARTMENT_OTHER): Payer: Self-pay

## 2018-11-03 ENCOUNTER — Emergency Department (HOSPITAL_BASED_OUTPATIENT_CLINIC_OR_DEPARTMENT_OTHER)
Admission: EM | Admit: 2018-11-03 | Discharge: 2018-11-03 | Disposition: A | Discharge status: Home / Self Care | Payer: Self-pay | Attending: Emergency Medicine | Admitting: Emergency Medicine

## 2018-11-03 DIAGNOSIS — Z20822 Contact with and (suspected) exposure to covid-19: Secondary | ICD-10-CM

## 2018-11-03 DIAGNOSIS — R05 Cough: Secondary | ICD-10-CM | POA: Insufficient documentation

## 2018-11-03 DIAGNOSIS — Z20828 Contact with and (suspected) exposure to other viral communicable diseases: Secondary | ICD-10-CM | POA: Insufficient documentation

## 2018-11-03 DIAGNOSIS — J029 Acute pharyngitis, unspecified: Secondary | ICD-10-CM | POA: Insufficient documentation

## 2018-11-03 NOTE — ED Triage Notes (Signed)
Pt c/o coughing up mucous since this morning; sts nostrils felt dry on Sun, then throat felt sore; coughing started today after a hot shower

## 2018-11-03 NOTE — ED Provider Notes (Addendum)
[facility] EMER[facility]ENC[facility] DEPARTMENT Provider Note   CSN: [id number] Arrival date & time: 11/03/18  1041     History   Chief Complaint Chief Complaint  Patient presents with  . Cough    HPI Melanie Cruz is a 23 y.o. female.     Patient with no known COVID-19 exposure.  But became ill on Sunday.  Developed a cough today.  Yesterday had a mild sore throat.  Had some congestion starting on Sunday.  No significant myalgias no known fever.  No nausea vomiting or diarrhea.  Sore throat is very mild.     Past Medical History:  Diagnosis Date  . Migraine     Patient Active Problem List   Diagnosis Date Noted  . 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 Tobacco abuse counseling 03/13/2017    Past Surgical History:  Procedure Laterality Date  . NO PAST SURGERIES       OB History    Gravida  2   Para  1   Term  1   Preterm      AB  1   Living  1     SAB      TAB      Ectopic      Multiple  0   Live Births  1            Home Medications    Prior to Admission medications   Medication Sig Start Date End Date Taking? Authorizing Provider  cephALEXin (KEFLEX) 500 MG capsule Take 1 capsule (500 mg total) by mouth 2 (two) times daily. 04/21/18   Saguier, Percell Miller, PA-C  Norgestimate-Ethinyl Estradiol Triphasic 0.18/0.215/0.25 MG-25 MCG tab Take 1 tablet by mouth daily. 04/22/18   Saguier, Percell Miller, PA-C  predniSONE (DELTASONE) 10 MG tablet 4 tab po day 1, 3 tab po day 2, 2 tab po day 3, 1 tab po day 4 04/21/18   Saguier, Percell Miller, PA-C    Family History Family History  Problem Relation Age of Onset  . Hypertension Mother   . Breast cancer Paternal Aunt        age at onset 72's    Social History Social History   Tobacco Use  . Smoking status: Current Every Day Smoker    Packs/day: 0.50    Years: 5.00    Pack years: 2.50    Types: Cigarettes  . Smokeless tobacco: Never Used  Substance Use Topics  . Alcohol use: Yes  . Drug use: Not  Currently    Types: Marijuana    Comment: occasional-last use 05/23/2017     Allergies   Patient has no known allergies.   Review of Systems Review of Systems  Constitutional: Negative for chills and fever.  HENT: Positive for congestion and sore throat. Negative for rhinorrhea.   Eyes: Negative for visual disturbance.  Respiratory: Positive for cough. Negative for shortness of breath.   Cardiovascular: Negative for chest pain and leg swelling.  Gastrointestinal: Negative for abdominal pain, diarrhea, nausea and vomiting.  Genitourinary: Negative for dysuria.  Musculoskeletal: Negative for back pain and neck pain.  Skin: Negative for rash.  Neurological: Negative for dizziness, light-headedness and headaches.  Hematological: Does not bruise/bleed easily.  Psychiatric/Behavioral: Negative for confusion.     Physical Exam Updated Vital Signs BP 127/75 (BP Location: Right Arm)   Pulse 97   Temp 98.8 F (37.1 C) (Oral)   Ht 1.651 m (5\' 5" )   Wt 99.8 kg   LMP 10/17/2018  SpO2 100%   BMI 36.61 kg/m   Physical Exam Vitals signs and nursing note reviewed.  Constitutional:      General: She is not in acute distress.    Appearance: Normal appearance. She is well-developed.  HENT:     Head: Normocephalic and atraumatic.  Eyes:     Extraocular Movements: Extraocular movements intact.     Conjunctiva/sclera: Conjunctivae normal.     Pupils: Pupils are equal, round, and reactive to light.  Neck:     Musculoskeletal: Normal range of motion and neck supple.  Cardiovascular:     Rate and Rhythm: Normal rate and regular rhythm.     Heart sounds: No murmur.  Pulmonary:     Effort: Pulmonary effort is normal. No respiratory distress.     Breath sounds: Normal breath sounds. No wheezing.  Abdominal:     Palpations: Abdomen is soft.     Tenderness: There is no abdominal tenderness.  Musculoskeletal: Normal range of motion.        General: No swelling.  Skin:    General:  Skin is warm and dry.  Neurological:     General: No focal deficit present.     Mental Status: She is alert and oriented to person, place, and time. Mental status is at baseline.      ED Treatments / Results  Labs (all labs ordered are listed, but only abnormal results are displayed) Labs Reviewed  NOVEL CORONAVIRUS, NAA (HOSP ORDER, SEND-OUT TO REF LAB; TAT 18-24 HRS)    EKG None  Radiology Dg Chest Port 1 View  Result Date: 11/03/2018 CLINICAL DATA:  Cough EXAM: PORTABLE CHEST 1 VIEW COMPARISON:  None. FINDINGS: The heart size and mediastinal contours are within normal limits. Both lungs are clear. The visualized skeletal structures are unremarkable. IMPRESSION: No acute abnormality of the lungs in AP portable projection. Electronically Signed   By: Eddie Candle M.D.   On: 11/03/2018 12:15    Procedures Procedures (including critical care time)  Medications Ordered in ED Medications - No data to display   Initial Impression / Assessment and Plan / ED Course  I have reviewed the triage vital signs and the nursing notes.  Pertinent labs & imaging results that were available during my care of the patient were reviewed by me and considered in my medical decision making (see chart for details).        Chest x-ray negative for any evidence of pneumonia.  Symptoms could be consistent with COVID-19 infection.  Will do outpatient Covid test.  And have patient self isolate work note provided.  Patient nontoxic no acute distress.  Final Clinical Impressions(s) / ED Diagnoses   Final diagnoses:  Suspected COVID-19 virus infection    ED Discharge Orders    None       Fredia Sorrow, MD 11/03/18 1318    Fredia Sorrow, MD 11/03/18 1318

## 2018-11-03 NOTE — Discharge Instructions (Addendum)
Outpatient Covid testing done.  Should have results in 1 to 2 days.  Return for any new or worse symptoms.  Treat yourself symptomatically as if it was an upper respiratory infection.  Work note provided to stay out of work until we know whether you have a COVID-19 infection or not.  You should stay away from folks and self isolate as much as possible.  It is okay to work from home.

## 2018-11-05 LAB — NOVEL CORONAVIRUS, NAA (HOSP ORDER, SEND-OUT TO REF LAB; TAT 18-24 HRS): SARS-CoV-2, NAA: NOT DETECTED

## 2018-11-07 ENCOUNTER — Encounter: Payer: Self-pay | Admitting: Medical

## 2018-11-19 ENCOUNTER — Other Ambulatory Visit: Payer: Self-pay

## 2018-11-19 ENCOUNTER — Encounter: Payer: Self-pay | Admitting: Medical

## 2018-11-20 ENCOUNTER — Ambulatory Visit (HOSPITAL_BASED_OUTPATIENT_CLINIC_OR_DEPARTMENT_OTHER)
Admission: RE | Admit: 2018-11-20 | Discharge: 2018-11-20 | Disposition: A | Discharge status: Home / Self Care | Payer: Self-pay | Source: Ambulatory Visit | Attending: Medical | Admitting: Medical

## 2018-11-20 ENCOUNTER — Ambulatory Visit (INDEPENDENT_AMBULATORY_CARE_PROVIDER_SITE_OTHER): Payer: Self-pay | Admitting: Medical

## 2018-11-20 ENCOUNTER — Encounter: Payer: Self-pay | Admitting: Medical

## 2018-11-20 VITALS — BP 112/68 | HR 86 | Temp 97.5°F | Resp 16 | Ht 65.0 in | Wt 226.4 lb

## 2018-11-20 DIAGNOSIS — M25571 Pain in right ankle and joints of right foot: Secondary | ICD-10-CM

## 2018-11-20 DIAGNOSIS — M25572 Pain in left ankle and joints of left foot: Secondary | ICD-10-CM

## 2018-11-20 DIAGNOSIS — M79662 Pain in left lower leg: Secondary | ICD-10-CM

## 2018-11-20 DIAGNOSIS — M7661 Achilles tendinitis, right leg: Secondary | ICD-10-CM

## 2018-11-20 MED ORDER — DICLOFENAC SODIUM 75 MG PO TBEC: 75.0000 mg | DELAYED_RELEASE_TABLET | Freq: Two times a day (BID) | ORAL | 0 refills | Status: DC

## 2018-11-20 MED FILL — DICLOFENAC SODIUM 75 MG TAB: 75 | 15 days supply | Qty: 30 | Fill #0

## 2018-11-20 NOTE — Addendum Note (Signed)
Addended by: Anabel Halon on: 11/20/2018 02:17 PM   Modules accepted: Orders

## 2018-11-20 NOTE — Patient Instructions (Signed)
For rt and left ankle pain as well as rt achilles tendon pain, I put in xray of both ankle, advise using ace wraps and prescribed diclofenac.  For left calf pain, placed order for [place] of lower ext. Please get done today. Will update you on result by calling you or sending my chart message.  Follow up 7 days or as needed

## 2018-11-20 NOTE — Progress Notes (Signed)
Subjective:    Patient ID: Melanie Cruz, female    DOB: Jul 20, 1995, 23 y.o.   MRN: [id number]  HPI  Pt in for some recent swelling of her left ankle. She states first noticed this last week. Hx of remote fracture to left ankle when she was 7th grade. She denies any ankle pain.   She also notes that on her rt ankle will hurt if she stands on tip toes. Points to achilles tendon and anterior ankle.  She denies any recent injury or fall.   LMP- presently. Started 2 days ago.   No sob, no wheezing, no pretibial edema and no orthopnea.   Pt did move recently.     Review of Systems  Constitutional: Negative for chills, fatigue and fever.  Respiratory: Negative for chest tightness, shortness of breath, wheezing and stridor.   Cardiovascular: Negative for chest pain and palpitations.  Gastrointestinal: Negative for abdominal pain.  Musculoskeletal: Negative for back pain, myalgias and neck pain.       See hpi.   Skin: Negative for rash.  Hematological: Negative for adenopathy. Does not bruise/bleed easily.  Psychiatric/Behavioral: Negative for behavioral problems, self-injury and suicidal ideas. The patient is not nervous/anxious.    Past Medical History:  Diagnosis Date  . Migraine      Social History   Socioeconomic History  . Marital status: Single    Spouse name: Not on file  . Number of children: Not on file  . Years of education: Not on file  . Highest education level: Not on file  Occupational History  . Not on file  Social Needs  . Financial resource strain: Not on file  . Food insecurity    Worry: Not on file    Inability: Not on file  . Transportation needs    Medical: Not on file    Non-medical: Not on file  Tobacco Use  . Smoking status: Current Every Day Smoker    Packs/day: 0.50    Years: 5.00    Pack years: 2.50    Types: Cigarettes  . Smokeless tobacco: Never Used  Substance and Sexual Activity  . Alcohol use: Yes  . Drug use: Not Currently     Types: Marijuana    Comment: occasional-last use 05/23/2017  . Sexual activity: Yes    Birth control/protection: None  Lifestyle  . Physical activity    Days per week: Not on file    Minutes per session: Not on file  . Stress: Not on file  Relationships  . Social Herbalist on phone: Not on file    Gets together: Not on file    Attends religious service: Not on file    Active member of club or organization: Not on file    Attends meetings of clubs or organizations: Not on file    Relationship status: Not on file  . Intimate partner violence    Fear of current or ex partner: Not on file    Emotionally abused: Not on file    Physically abused: Not on file    Forced sexual activity: Not on file  Other Topics Concern  . Not on file  Social History Narrative  . Not on file    Past Surgical History:  Procedure Laterality Date  . NO PAST SURGERIES      Family History  Problem Relation Age of Onset  . Hypertension Mother   . Breast cancer Paternal Aunt  age at onset 42's    No Known Allergies  No current outpatient medications on file prior to visit.   No current facility-administered medications on file prior to visit.     BP 112/68   Pulse 86   Temp (!) 97.5 F (36.4 C) (Temporal)   Resp 16   Ht 5\' 5"  (1.651 m)   Wt 226 lb 6.4 oz (102.7 kg)   SpO2 100%   BMI 37.67 kg/m       Objective:   Physical Exam   General- No acute distress. Pleasant patient. Neck- Full range of motion, no jvd Lungs- Clear, even and unlabored. Heart- regular rate and rhythm. Neurologic- CNII- XII grossly intact. Rt lower ext- rt calf is not swollen. No tenderness. Negative homans sign. Left lowe ext- not swollen but medial aspect tender. Not warm, no induration.  Left ankle- lateral tenderness over talofibular area. Rt ankle- anterior aspect tender. Achilles tendon pain on palpation.       Assessment & Plan:  [phone]  For rt and left ankle pain  as well as rt achilles tendon pain, I put in xray of both ankle, advise using ace wraps and prescribed diclofenac.  For left calf pain, placed order for [place] of lower ext. Please get done today. Will update you on result by calling you or sending my chart message.  Follow up 7 days or as needed  25 minutes spent with pt. 50% of time spent counseling pt on plan going forward.  Mackie Pai, PA-C

## 2018-12-21 ENCOUNTER — Ambulatory Visit (INDEPENDENT_AMBULATORY_CARE_PROVIDER_SITE_OTHER): Payer: Self-pay | Admitting: Medical

## 2018-12-21 ENCOUNTER — Telehealth: Payer: Self-pay | Admitting: Medical

## 2018-12-21 ENCOUNTER — Other Ambulatory Visit: Payer: Self-pay

## 2018-12-21 ENCOUNTER — Encounter: Payer: Self-pay | Admitting: Medical

## 2018-12-21 VITALS — Wt 229.0 lb

## 2018-12-21 DIAGNOSIS — L089 Local infection of the skin and subcutaneous tissue, unspecified: Secondary | ICD-10-CM

## 2018-12-21 DIAGNOSIS — S46812A Strain of other muscles, fascia and tendons at shoulder and upper arm level, left arm, initial encounter: Secondary | ICD-10-CM

## 2018-12-21 DIAGNOSIS — L739 Follicular disorder, unspecified: Secondary | ICD-10-CM

## 2018-12-21 DIAGNOSIS — F17200 Nicotine dependence, unspecified, uncomplicated: Secondary | ICD-10-CM

## 2018-12-21 MED ORDER — DOXYCYCLINE HYCLATE 100 MG PO TABS: 100.0000 mg | ORAL_TABLET | Freq: Two times a day (BID) | ORAL | 0 refills | Status: DC

## 2018-12-21 MED ORDER — DICLOFENAC SODIUM 75 MG PO TBEC: 75.0000 mg | DELAYED_RELEASE_TABLET | Freq: Two times a day (BID) | ORAL | 0 refills | Status: DC

## 2018-12-21 MED ORDER — BUPROPION HCL ER (XL) 150 MG PO TB24: 150.0000 mg | ORAL_TABLET | Freq: Every day | ORAL | 2 refills | Status: DC

## 2018-12-21 MED ORDER — MUPIROCIN 2 % EX OINT: TOPICAL_OINTMENT | CUTANEOUS | 1 refills | Status: DC

## 2018-12-21 NOTE — Patient Instructions (Addendum)
You do have some description of probable folliculitis that occurs on your medial thighs secondary to friction between your thighs.  You report painful bumps are occurring routine basis almost every week.  So we will prescribe doxycycline antibiotic to cover for folliculitis and skin infection.  In the future you can use mupirocin on topical antibiotic twice daily to affected area/bumps when they arise.  I do think it would be beneficial for you to get Palmer's moisturizing cream with vitamin E to apply to your thighs as well.   You gained weight since last spring.  I do recommend diet such as weight watchers app.  I think this would be helpful and this might benefit your thighs as weight loss and thigh area might decrease friction causing the above.  For smoking cessation, I want you to start Wellbutrin and take this daily for about 3 months.  In about 2 weeks try to start tapering off of cigarettes as discussed.  You do have some recent left trapezius region pain on range of motion of left upper extremity.  This most likely represents muscle strain.  I do not see any shingles type eruption or rash presently.  If any were to occur let us know.  Presently only recommend diclofenac anti-inflammatory.  Prescription sent to pharmacy.  Follow-up in 2 weeks or as needed.

## 2018-12-21 NOTE — Telephone Encounter (Signed)
Pt wants refill of cleocin solution. I don't think I ever prescribed this for her. I want her to call and get scheduled for appointment. Can be virtual. She sent limited info message via my chart?  Can get her scheduled today

## 2018-12-21 NOTE — Telephone Encounter (Signed)
Patient scheduled for today

## 2018-12-21 NOTE — Progress Notes (Signed)
Subjective:    Patient ID: Melanie Cruz, female    DOB: 1995/02/08, 23 y.o.   MRN: [id number]  HPI  Virtual Visit via Video Note  I connected with Melanie Cruz on 12/21/18 at  3:20 PM EST by a video enabled telemedicine application and verified that I am speaking with the correct person using two identifiers.  Location: Patient: home Provider: office  Pt did not check bp today.   I discussed the limitations of evaluation and management by telemedicine and the availability of in person appointments. The patient expressed understanding and agreed to proceed.    History of Present Illness:  Pt has some bumps between her thighs. She googled that topical clindamycin solution might help. She has some chronic scarring and dark color appearance. This has occurred for months.  Occasional raised area of skin will occur. Hair follicle that looks like it gets inflamed. Will get these almost constantly. One will resolve and other reoccur. No description of abscess in the past.  Pt also has lower trapezius area pain for 2 days. No rash or vesicles.  Hurts when lifts her arm   LMP- December 09, 2018. Not breast feeding any child. 62 yo done breast feeding.   Pt states smoking more recently. Now smoking 15 cigarettes a day. In past rx'd wellbutrin and she did not take.   She also gaining weight since spring last year. Various diets tried. Most recently failed keto diet.    Observations/Objective: General-no acute distress, pleasant, oriented. Lungs- on inspection lungs appear unlabored. Neck- no tracheal deviation or jvd on inspection. Neuro- gross motor function appears intact. Skin- medial thigh large dark hyperigmented skin with scattered raised follicles. Back- pt points to left lower trapezius/area medial to scapula as source of pain. No rash seen in this area.   Assessment and Plan:  You do have some description of probable folliculitis that occurs on your medial thighs  secondary to friction between your thighs.  You report painful bumps are occurring routine basis almost every week.  So we will prescribe doxycycline antibiotic to cover for folliculitis and skin infection.  In the future you can use mupirocin on topical antibiotic twice daily to affected area/bumps when they arise.  I do think it would be beneficial for you to get Palmer's moisturizing cream with vitamin E to apply to your thighs as well.   You gained weight since last spring.  I do recommend diet such as weight watchers app.  I think this would be helpful and this might benefit your thighs as weight loss and thigh area might decrease friction causing the above.  For smoking cessation, I want you to start Wellbutrin and take this daily for about 3 months.  In about 2 weeks try to start tapering off of cigarettes as discussed.  You do have some recent left trapezius region pain on range of motion of left upper extremity.  This most likely represents muscle strain.  I do not see any shingles type eruption or rash presently.  If any were to occur let us know.  Presently only recommend diclofenac anti-inflammatory.  Prescription sent to pharmacy.  Follow-up in 2 weeks or as needed.  Follow Up Instructions:    I discussed the assessment and treatment plan with the patient. The patient was provided an opportunity to ask questions and all were answered. The patient agreed with the plan and demonstrated an understanding of the instructions.   The patient was advised to call back or seek  an in-person evaluation if the symptoms worsen or if the condition fails to improve as anticipated.  40 minutes spent with pt. 50% of time spent counseling on plan going forward for each diagnosis.   Mackie Pai, PA-C    Review of Systems  Constitutional: Negative for chills, fatigue and fever.  HENT: Negative for congestion, drooling, ear pain and hearing loss.   Respiratory: Negative for cough, chest  tightness, shortness of breath and wheezing.   Cardiovascular: Negative for chest pain and palpitations.  Gastrointestinal: Negative for abdominal pain and anal bleeding.  Musculoskeletal: Negative for back pain, joint swelling and neck stiffness.       See hpi; trapezius pain.  Skin: Positive for rash.       See hpi regarding thighs.  Hematological: Negative for adenopathy. Does not bruise/bleed easily.  Psychiatric/Behavioral: Negative for behavioral problems.    Past Medical History:  Diagnosis Date  . Migraine      Social History   Socioeconomic History  . Marital status: Single    Spouse name: Not on file  . Number of children: Not on file  . Years of education: Not on file  . Highest education level: Not on file  Occupational History  . Not on file  Tobacco Use  . Smoking status: Current Every Day Smoker    Packs/day: 0.50    Years: 5.00    Pack years: 2.50    Types: Cigarettes  . Smokeless tobacco: Never Used  Substance and Sexual Activity  . Alcohol use: Yes  . Drug use: Not Currently    Types: Marijuana    Comment: occasional-last use 05/23/2017  . Sexual activity: Yes    Birth control/protection: None  Other Topics Concern  . Not on file  Social History Narrative  . Not on file   Social Determinants of Health   Financial Resource Strain:   . Difficulty of Paying Living Expenses: Not on file  Food Insecurity:   . Worried About Charity fundraiser in the Last Year: Not on file  . Ran Out of Food in the Last Year: Not on file  Transportation Needs:   . Lack of Transportation (Medical): Not on file  . Lack of Transportation (Non-Medical): Not on file  Physical Activity:   . Days of Exercise per Week: Not on file  . Minutes of Exercise per Session: Not on file  Stress:   . Feeling of Stress : Not on file  Social Connections:   . Frequency of Communication with Friends and Family: Not on file  . Frequency of Social Gatherings with Friends and Family:  Not on file  . Attends Religious Services: Not on file  . Active Member of Clubs or Organizations: Not on file  . Attends Archivist Meetings: Not on file  . Marital Status: Not on file  Intimate Partner Violence:   . Fear of Current or Ex-Partner: Not on file  . Emotionally Abused: Not on file  . Physically Abused: Not on file  . Sexually Abused: Not on file    Past Surgical History:  Procedure Laterality Date  . NO PAST SURGERIES      Family History  Problem Relation Age of Onset  . Hypertension Mother   . Breast cancer Paternal Aunt        age at onset 30's    No Known Allergies  No current outpatient medications on file prior to visit.   No current facility-administered medications on file prior to  visit.    Wt 229 lb (103.9 kg)   LMP 12/09/2018   BMI 38.11 kg/m       Objective:   Physical Exam        Assessment & Plan:

## 2019-01-18 ENCOUNTER — Other Ambulatory Visit: Payer: Self-pay

## 2019-01-18 ENCOUNTER — Ambulatory Visit (INDEPENDENT_AMBULATORY_CARE_PROVIDER_SITE_OTHER): Payer: Self-pay | Admitting: Internal Medicine

## 2019-01-18 ENCOUNTER — Encounter: Payer: Self-pay | Admitting: Internal Medicine

## 2019-01-18 DIAGNOSIS — J069 Acute upper respiratory infection, unspecified: Secondary | ICD-10-CM

## 2019-01-18 NOTE — Progress Notes (Signed)
Subjective:    Patient ID: Melanie Cruz, female    DOB: [birth year], 24 y.o.   MRN: [id number]  DOS:  01/18/2019 Type of visit - description: Virtual Visit via Video Note  I connected with the above patient  by a video enabled telemedicine application and verified that I am speaking with the correct person using two identifiers.   THIS ENCOUNTER IS A VIRTUAL VISIT DUE TO COVID-19 - PATIENT WAS NOT SEEN IN THE OFFICE. PATIENT HAS CONSENTED TO VIRTUAL VISIT / TELEMEDICINE VISIT   Location of patient: home  Location of provider: office  I discussed the limitations of evaluation and management by telemedicine and the availability of in person appointments. The patient expressed understanding and agreed to proceed.  Acute A week ago had a single episode of nosebleeds. Then she developed nasal congestion, frontal pressure, he felt that she was developed sinus infection. 2 weeks ago she lost her sense of smell and went to be tested for COVID-19, results pending.    Review of Systems Denies fever chills No nausea, vomiting, diarrhea Minimal cough if any. No chest pain no difficulty breathing. Not on birth control pills, last menstrual period few days ago. Still smoking   Past Medical History:  Diagnosis Date  . Migraine     Past Surgical History:  Procedure Laterality Date  . NO PAST SURGERIES      Social History   Socioeconomic History  . Marital status: Single    Spouse name: Not on file  . Number of children: Not on file  . Years of education: Not on file  . Highest education level: Not on file  Occupational History  . Not on file  Tobacco Use  . Smoking status: Current Every Day Smoker    Packs/day: 0.50    Years: 5.00    Pack years: 2.50    Types: Cigarettes  . Smokeless tobacco: Never Used  Substance and Sexual Activity  . Alcohol use: Yes  . Drug use: Not Currently    Types: Marijuana    Comment: occasional-last use 05/23/2017  . Sexual activity: Yes    Birth control/protection: None  Other Topics Concern  . Not on file  Social History Narrative  . Not on file   Social Determinants of Health   Financial Resource Strain:   . Difficulty of Paying Living Expenses: Not on file  Food Insecurity:   . Worried About Charity fundraiser in the Last Year: Not on file  . Ran Out of Food in the Last Year: Not on file  Transportation Needs:   . Lack of Transportation (Medical): Not on file  . Lack of Transportation (Non-Medical): Not on file  Physical Activity:   . Days of Exercise per Week: Not on file  . Minutes of Exercise per Session: Not on file  Stress:   . Feeling of Stress : Not on file  Social Connections:   . Frequency of Communication with Friends and Family: Not on file  . Frequency of Social Gatherings with Friends and Family: Not on file  . Attends Religious Services: Not on file  . Active Member of Clubs or Organizations: Not on file  . Attends Archivist Meetings: Not on file  . Marital Status: Not on file  Intimate Partner Violence:   . Fear of Current or Ex-Partner: Not on file  . Emotionally Abused: Not on file  . Physically Abused: Not on file  . Sexually Abused: Not on file  Allergies as of 01/18/2019   No Known Allergies     Medication List       Accurate as of January 18, 2019  4:01 PM. If you have any questions, ask your nurse or doctor.        buPROPion 150 MG 24 hr tablet Commonly known as: WELLBUTRIN XL Take 1 tablet (150 mg total) by mouth daily.   diclofenac 75 MG EC tablet Commonly known as: VOLTAREN Take 1 tablet (75 mg total) by mouth 2 (two) times daily.   doxycycline 100 MG tablet Commonly known as: VIBRA-TABS Take 1 tablet (100 mg total) by mouth 2 (two) times daily. Can give caps or generic   mupirocin ointment 2 % Commonly known as: BACTROBAN Apply thin film to affected  area twice daily.           Objective:   Physical Exam There were no vitals taken for  this visit. This is a virtual video visit, she is alert oriented x3, mild nasal congestion noted, no cough, no distress.    Assessment    24 year old female, in general good health, no history of hypertension, smoker.  Not on birth control, presents with:  URI Has nasal congestion, forehead pressure, has lost the sense of smell. DDx: URI, possibly Covid or other viruses.  Also acute sinusitis. Plan: Wait for Covid results Flonase, Mucinex, fluids, Tylenol, rest. If Covid negative, she will call if not gradually improving. If Covid positive, she will have to be extremely careful, quarantine for 10 days and pay attention for her symptoms because if they get worse she needs to be seen.  I asked her to let us know the results of the test.    I discussed the assessment and treatment plan with the patient. The patient was provided an opportunity to ask questions and all were answered. The patient agreed with the plan and demonstrated an understanding of the instructions.   The patient was advised to call back or seek an in-person evaluation if the symptoms worsen or if the condition fails to improve as anticipated.
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
## 2019-02-18 ENCOUNTER — Encounter: Payer: Self-pay | Admitting: Medical

## 2019-02-19 ENCOUNTER — Ambulatory Visit: Payer: 59

## 2019-02-19 ENCOUNTER — Ambulatory Visit (INDEPENDENT_AMBULATORY_CARE_PROVIDER_SITE_OTHER): Payer: 59 | Admitting: Medical

## 2019-02-19 ENCOUNTER — Other Ambulatory Visit: Payer: Self-pay

## 2019-02-19 DIAGNOSIS — J029 Acute pharyngitis, unspecified: Secondary | ICD-10-CM | POA: Diagnosis not present

## 2019-02-19 MED ORDER — CEFTRIAXONE SODIUM 250 MG IJ SOLR
500.0000 mg | Freq: Once | INTRAMUSCULAR | Status: AC
  Administered 2019-02-19: 15:00:00 500 mg via INTRAMUSCULAR

## 2019-02-19 MED ORDER — DOXYCYCLINE HYCLATE 100 MG PO TABS: 100.0000 mg | ORAL_TABLET | Freq: Two times a day (BID) | ORAL | 0 refills | Status: DC

## 2019-02-19 MED FILL — DOXYCYCLINE HYCLATE 100 MG: 100 | 7 days supply | Qty: 14 | Fill #0

## 2019-02-19 NOTE — Addendum Note (Signed)
Addended by: Wynonia Musty A on: 02/19/2019 03:09 PM   Modules accepted: Orders

## 2019-02-19 NOTE — Progress Notes (Addendum)
   Subjective:    Patient ID: Melanie Cruz, female    DOB: 20-Feb-1995, 24 y.o.   MRN: [id number]  HPI  Virtual Visit via Telephone Note  I connected with Melanie Cruz on 02/19/19 at 10:40 AM EST by telephone and verified that I am speaking with the correct person using two identifiers.  Location: Patient: car parked. Provider: office   I discussed the limitations, risks, security and privacy concerns of performing an evaluation and management service by telephone and the availability of in person appointments. I also discussed with the patient that there may be a patient responsible charge related to this service. The patient expressed understanding and agreed to proceed.   History of Present Illness:  Pt states she has sore throat. Mild discomfort. Pt has states occurred after oral sex Sunday morning.Marland Kitchen Next morning sore.  LMP- about one week.  Pt has concern for g and c.   Observations/Objective:  General- no acute distress.  Pleasant, alert and oriented. Normal speech.  Assessment and Plan: Pharyngitis recently. With timeline and recent history will treat for G and C. Will get MA to explain that I think culture would be beneficial so partner could be treated in event positive.  Pt scheduled to get rocephin 500 mg im today at 2:30. Doxycyline oral antibiotic as well.  Follow up 7-10 days any residual symptoms or as needed  Mackie Pai, PA-C  Follow Up Instructions:    I discussed the assessment and treatment plan with the patient. The patient was provided an opportunity to ask questions and all were answered. The patient agreed with the plan and demonstrated an understanding of the instructions.   The patient was advised to call back or seek an in-person evaluation if the symptoms worsen or if the condition fails to improve as anticipated.  I provided 20  minutes of non-face-to-face time during this encounter.   Mackie Pai, PA-C   Review of Systems       Objective:   Physical Exam        Assessment & Plan:

## 2019-02-19 NOTE — Patient Instructions (Signed)
Pharyngitis recently. With timeline and recent history will treat for G and C. Will get MA to explain that I think culture would be beneficial so partner could be treated in event positive.  Pt scheduled to get rocephin 500 mg im today at 2:30. Doxycyline oral antibiotic as well.  Follow up 7-10 days any residual symptoms or as needed

## 2019-03-10 ENCOUNTER — Ambulatory Visit (INDEPENDENT_AMBULATORY_CARE_PROVIDER_SITE_OTHER): Payer: 59 | Admitting: Medical

## 2019-03-10 ENCOUNTER — Other Ambulatory Visit: Payer: Self-pay

## 2019-03-10 VITALS — Wt 229.0 lb

## 2019-03-10 DIAGNOSIS — R220 Localized swelling, mass and lump, head: Secondary | ICD-10-CM | POA: Diagnosis not present

## 2019-03-10 DIAGNOSIS — L089 Local infection of the skin and subcutaneous tissue, unspecified: Secondary | ICD-10-CM

## 2019-03-10 MED ORDER — VALACYCLOVIR HCL 1 G PO TABS: 1000.0000 mg | ORAL_TABLET | Freq: Two times a day (BID) | ORAL | 0 refills | Status: DC

## 2019-03-10 MED ORDER — CEPHALEXIN 500 MG PO CAPS: 500.0000 mg | ORAL_CAPSULE | Freq: Two times a day (BID) | ORAL | 0 refills | Status: DC

## 2019-03-10 MED ORDER — PREDNISONE 10 MG (21) PO TBPK: ORAL_TABLET | ORAL | 0 refills | Status: DC

## 2019-03-10 NOTE — Progress Notes (Signed)
   Subjective:    Patient ID: Melanie Cruz, female    DOB: [birth year], 24 y.o.   MRN: [id number]  Virtual Visit via Video Note  I connected with Melanie Cruz on 03/10/19 at  2:20 PM EST by a video enabled telemedicine application and verified that I am speaking with the correct person using two identifiers.  Location: Patient: home Provider: office   I discussed the limitations of evaluation and management by telemedicine and the availability of in person appointments. The patient expressed understanding and agreed to proceed.  History of Present Illness:  Pt thinks has large lip rt side upper area. She states the area is rock hard. She states there was a yellow crust to surface. She states inside of lip feels hard also.  On margin of upper lip no vesicles reported.   No tongue swelling, no sob, no wheezing. Left side of lip is not swollen. Pt has no new medications. 3 weeks ago she was on doxycyline. Pt states her lips never swelled with doxycycline.   I offered throat on last visit. With treatment she got a lot better. Throat pain resolved within 1 days. She has 5 tabs left of doxy I prescribed.  No ace inhibitor type medication.  Pt never had herpes type I eruption.  lmp- Feb 16, 2019.  Pt is doing colon cleanse.(advised)    Observations/Objective:  General-no acute distress, pleasant, oriented. Lungs- on inspection lungs appear unlabored. Neck- no tracheal deviation or jvd on inspection. Neuro- gross motor function appears intact. heent- negative except rt upper lipid moderate to severe swelling. Left side upper lip is normal.  Assessment and Plan: Recent acute onset of upper lip swelling right side.  On inspection no left side swelling and no lower lip swelling.  Video visit today in differential diagnoses would include skin infection, herpes type I outbreak with secondary skin infection or atypical allergic reaction.  Advised patient to stop her colon cleanse  tea.  Prescribe Keflex antibiotic, Valtrex and 6-day taper dose of prednisone.  Giving update tomorrow afternoon whether or not do right side of his upper lip is less swollen.  If any swelling of the left side upper lip or lower lip swelling then recommend ED evaluation.  Also ED evaluation for any shortness of breath or wheezing.  Patient expressed understanding.  If lipids virtually the same despite above treatment then will likely go ahead and make referral to ENT.  The patient is gradually getting better than will follow up on Monday morning to assess in person.  Patient advised to please call back and get scheduled for that Monday ointment.  Follow Up Instructions:    I discussed the assessment and treatment plan with the patient. The patient was provided an opportunity to ask questions and all were answered. The patient agreed with the plan and demonstrated an understanding of the instructions.   The patient was advised to call back or seek an in-person evaluation if the symptoms worsen or if the condition fails to improve as anticipated.  I provided 25 minutes of non-face-to-face time during this encounter.   Mackie Pai, PA-C    Review of Systems     Objective:   Physical Exam        Assessment & Plan:

## 2019-03-10 NOTE — Patient Instructions (Addendum)
Recent acute onset of upper lip swelling right side.  On inspection no left side swelling and no lower lip swelling.  Video visit today in differential diagnoses would include skin infection, herpes type I outbreak with secondary skin infection or atypical allergic reaction.  Advised patient to stop her colon cleanse tea.  Prescribe Keflex antibiotic, Valtrex and 6-day taper dose of prednisone.  Giving update tomorrow afternoon whether or not do right side of his upper lip is less swollen.  If any swelling of the left side upper lip or lower lip swelling then recommend ED evaluation.  Also ED evaluation for any shortness of breath or wheezing.  Patient expressed understanding.  If lipids virtually the same despite above treatment then will likely go ahead and make referral to ENT.  The patient is gradually getting better than will follow up on Monday morning to assess in person.  Patient advised to please call back and get scheduled for that Monday ointment.

## 2019-03-11 ENCOUNTER — Telehealth: Payer: Self-pay | Admitting: Medical

## 2019-03-11 NOTE — Telephone Encounter (Signed)
Pt Wanted to let Melanie Cruz know that the meds he prescribed her did work out. She is requesting Melanie Cruz to call her back. She  Has been put on the schedule for Monday morning. However she still wants him to call her back. I tired to get more info on why cb is needed. But this is all she would give. Thanks

## 2019-03-11 NOTE — Telephone Encounter (Signed)
Paitent called she wanted a virtual visit today , but he has no appointments so she will keep her Monday appointment

## 2019-03-15 ENCOUNTER — Ambulatory Visit (INDEPENDENT_AMBULATORY_CARE_PROVIDER_SITE_OTHER): Payer: 59 | Admitting: Medical

## 2019-03-15 ENCOUNTER — Other Ambulatory Visit: Payer: Self-pay

## 2019-03-15 VITALS — BP 110/60 | HR 51 | Temp 97.3°F | Resp 18 | Ht 64.0 in | Wt 230.6 lb

## 2019-03-15 DIAGNOSIS — R220 Localized swelling, mass and lump, head: Secondary | ICD-10-CM | POA: Diagnosis not present

## 2019-03-15 DIAGNOSIS — K130 Diseases of lips: Secondary | ICD-10-CM | POA: Diagnosis not present

## 2019-03-15 DIAGNOSIS — R439 Unspecified disturbances of smell and taste: Secondary | ICD-10-CM

## 2019-03-15 DIAGNOSIS — F17200 Nicotine dependence, unspecified, uncomplicated: Secondary | ICD-10-CM | POA: Diagnosis not present

## 2019-03-15 DIAGNOSIS — Z113 Encounter for screening for infections with a predominantly sexual mode of transmission: Secondary | ICD-10-CM

## 2019-03-15 DIAGNOSIS — Z1152 Encounter for screening for COVID-19: Secondary | ICD-10-CM

## 2019-03-15 DIAGNOSIS — J3489 Other specified disorders of nose and nasal sinuses: Secondary | ICD-10-CM

## 2019-03-15 MED ORDER — NICOTINE 21 MG/24HR TD PT24: 21.0000 mg | MEDICATED_PATCH | Freq: Every day | TRANSDERMAL | 0 refills | Status: DC

## 2019-03-15 MED FILL — NICOTINE 21 MG/24HR PATCH: 21 | 28 days supply | Qty: 28 | Fill #0

## 2019-03-15 NOTE — Patient Instructions (Signed)
Your upper lip swelling is much improved.  Almost back to baseline.  However still has healing lesion present.  Continue with antibiotic, take prednisone and antiviral medication.  Will order HSV antibody studies today.  You have some recent complaint of chronic nose irritation and request referral to ENT.  I did place a referral and will also see what he thinks regarding lip lesion.  I will get his opinion on differential diagnosis.  Knowing results of HSV studies would be helpful.  I will update you on those results.  For altered sense of smell since 1 month ago, decided to go ahead and get Covid IgG antibody.  For history of smoking, prescribed nicotine patches.  Cut back on smoking and next month can reduce dosage of patch.  Follow-up in 1 month or as needed

## 2019-03-15 NOTE — Progress Notes (Addendum)
Subjective:    Patient ID: Melanie Cruz, female    DOB: 12/19/1995, 24 y.o.   MRN: [id number]  HPI  Pt in for follow up for swollen area on rt upper lip region. See last note.   Last week A/P  Recent acute onset of upper lip swelling right side.  On inspection no left side swelling and no lower lip swelling.  Video visit today in differential diagnoses would include skin infection, herpes type I outbreak with secondary skin infection or atypical allergic reaction.  Advised patient to stop her colon cleanse tea.  Prescribe Keflex antibiotic, Valtrex and 6-day taper dose of prednisone.  Giving update tomorrow afternoon whether or not do right side of his upper lip is less swollen.  If any swelling of the left side upper lip or lower lip swelling then recommend ED evaluation.  Also ED evaluation for any shortness of breath or wheezing.  Patient expressed understanding.  If lip virtually the same despite above treatment then will likely go ahead and make referral to ENT.  The patient is gradually getting better then will follow up on Monday morning to assess in person.  Patient advised to please call back and get scheduled for that Monday ointment.  The lips are lot less swollen. Now only healing wound mid lip where she thought was a blister initially.  Pt states she never had cold sores before in the past.   Pt mentions today that she does smoke and she wants to quit.   Pt also mentions chronic nose irritation. No sneezing. Just states dry sensation. Pt states last month had brief period where she lost smell. She feels like smell came back but never came back completely. A month ago ago got swab of her nose and covid test came back neagtive.   LMP- Feb 16, 2019.     Review of Systems  Constitutional: Negative for chills, fatigue and fever.  Respiratory: Negative for cough, chest tightness, shortness of breath and wheezing.   Cardiovascular: Negative for chest pain and  palpitations.  Gastrointestinal: Negative for abdominal pain.  Musculoskeletal: Negative for back pain.  Skin: Negative for rash.  Neurological: Negative for dizziness, syncope and headaches.  Hematological: Negative for adenopathy. Does not bruise/bleed easily.  Psychiatric/Behavioral: Negative for behavioral problems and confusion. The patient is not nervous/anxious.     Past Medical History:  Diagnosis Date  . Migraine      Social History   Socioeconomic History  . Marital status: Single    Spouse name: Not on file  . Number of children: Not on file  . Years of education: Not on file  . Highest education level: Not on file  Occupational History  . Not on file  Tobacco Use  . Smoking status: Current Every Day Smoker    Packs/day: 0.50    Years: 5.00    Pack years: 2.50    Types: Cigarettes  . Smokeless tobacco: Never Used  Substance and Sexual Activity  . Alcohol use: Yes  . Drug use: Not Currently    Types: Marijuana    Comment: occasional-last use 05/23/2017  . Sexual activity: Yes    Birth control/protection: None  Other Topics Concern  . Not on file  Social History Narrative  . Not on file   Social Determinants of Health   Financial Resource Strain:   . Difficulty of Paying Living Expenses: Not on file  Food Insecurity:   . Worried About Charity fundraiser in the Last  Year: Not on file  . Ran Out of Food in the Last Year: Not on file  Transportation Needs:   . Lack of Transportation (Medical): Not on file  . Lack of Transportation (Non-Medical): Not on file  Physical Activity:   . Days of Exercise per Week: Not on file  . Minutes of Exercise per Session: Not on file  Stress:   . Feeling of Stress : Not on file  Social Connections:   . Frequency of Communication with Friends and Family: Not on file  . Frequency of Social Gatherings with Friends and Family: Not on file  . Attends Religious Services: Not on file  . Active Member of Clubs or  Organizations: Not on file  . Attends Archivist Meetings: Not on file  . Marital Status: Not on file  Intimate Partner Violence:   . Fear of Current or Ex-Partner: Not on file  . Emotionally Abused: Not on file  . Physically Abused: Not on file  . Sexually Abused: Not on file    Past Surgical History:  Procedure Laterality Date  . NO PAST SURGERIES      Family History  Problem Relation Age of Onset  . Hypertension Mother   . Breast cancer Paternal Aunt        age at onset 13's    No Known Allergies  Current Outpatient Medications on File Prior to Visit  Medication Sig Dispense Refill  . cephALEXin (KEFLEX) 500 MG capsule Take 1 capsule (500 mg total) by mouth 2 (two) times daily. 20 capsule 0  . doxycycline (VIBRA-TABS) 100 MG tablet Take 1 tablet (100 mg total) by mouth 2 (two) times daily. 14 tablet 0  . predniSONE (STERAPRED UNI-PAK 21 TAB) 10 MG (21) TBPK tablet Taper over 6 days. 21 tablet 0  . valACYclovir (VALTREX) 1000 MG tablet Take 1 tablet (1,000 mg total) by mouth 2 (two) times daily. 20 tablet 0   No current facility-administered medications on file prior to visit.    BP 110/60 (BP Location: Right Arm, Patient Position: Sitting, Cuff Size: Large)   Pulse (!) 51   Temp (!) 97.3 F (36.3 C) (Temporal)   Resp 18   Ht 5\' 4"  (1.626 m)   Wt 230 lb 9.6 oz (104.6 kg)   LMP 02/17/2019   SpO2 98%   BMI 39.58 kg/m       Objective:   Physical Exam   General Mental Status- Alert. General Appearance- Not in acute distress.   Skin General: Color- Normal Color. Moisture- Normal Moisture.  Neck Carotid Arteries- Normal color. Moisture- Normal Moisture. No carotid bruits. No JVD.  Chest and Lung Exam Auscultation: Breath Sounds:-Normal.  Cardiovascular Auscultation:Rythm- Regular. Murmurs & Other Heart Sounds:Auscultation of the heart reveals- No Murmurs.  Abdomen Inspection:-Inspeection Normal. Palpation/Percussion:Note:No mass. Palpation  and Percussion of the abdomen reveal- Non Tender, Non Distended + BS, no rebound or guarding.    Neurologic Cranial Nerve exam:- CN III-XII intact(No nystagmus), symmetric smile. Drift Test:- No drift. Romberg Exam:- Negative.  Heal to Toe Gait exam:-Normal. Finger to Nose:- Normal/Intact Strength:- 5/5 equal and symmetric strength both upper and lower extremities.   heent- no sinus pressure. Mouth- normal. Tongue not swollen. Rt side upper lip almost normal now in size. Healing lip lesion mid aspect of lip.    Assessment & Plan:  Your upper lip swelling is much improved.  Almost back to baseline.  However still has healing lesion present.  Continue with antibiotic, take prednisone and  antiviral medication.  Will order HSV antibody studies today.  You have some recent complaint of chronic nose irritation and request referral to ENT.  I did place a referral and will also see what he thinks regarding lip lesion.  I will get his opinion on differential diagnosis.  Knowing results of HSV studies would be helpful.  I will update you on those results.  For altered sense of smell since 1 month ago, decided to go ahead and get Covid IgG antibody.  For history of smoking, prescribed nicotine patches.  Cut back on smoking and next month can reduce dosage of patch.  Follow-up in 1 month or as needed  [employer], PA-C   30 minutes spent with pt today.

## 2019-03-16 ENCOUNTER — Encounter: Payer: Self-pay | Admitting: Medical

## 2019-03-16 LAB — SAR COV2 SEROLOGY (COVID19)AB(IGG),IA: SARS CoV2 AB IGG: POSITIVE — AB

## 2019-03-17 ENCOUNTER — Encounter: Payer: Self-pay | Admitting: Medical

## 2019-03-17 LAB — HSV(HERPES SMPLX)ABS-I+II(IGG+IGM)-BLD
HSV 1 Glycoprotein G Ab, IgG: 45.7 index — ABNORMAL HIGH (ref 0.00–0.90)
HSV 2 IgG, Type Spec: <0.91 index (ref 0.00–0.90)
HSVI/II Comb IgM: <0.91 Ratio (ref 0.00–0.90)

## 2019-03-17 NOTE — Telephone Encounter (Signed)
Called patient and went over labs

## 2019-03-17 NOTE — Telephone Encounter (Signed)
PT states that she didn't understand the mychart reading for her lab results  and needs more information. Please contact

## 2019-03-22 ENCOUNTER — Ambulatory Visit (INDEPENDENT_AMBULATORY_CARE_PROVIDER_SITE_OTHER): Payer: 59 | Admitting: Otolaryngology

## 2019-03-25 DIAGNOSIS — J310 Chronic rhinitis: Secondary | ICD-10-CM | POA: Insufficient documentation

## 2019-04-30 ENCOUNTER — Ambulatory Visit (INDEPENDENT_AMBULATORY_CARE_PROVIDER_SITE_OTHER): Payer: 59 | Admitting: Medical

## 2019-04-30 ENCOUNTER — Other Ambulatory Visit: Payer: Self-pay

## 2019-04-30 ENCOUNTER — Encounter: Payer: Self-pay | Admitting: Medical

## 2019-04-30 DIAGNOSIS — R1011 Right upper quadrant pain: Secondary | ICD-10-CM

## 2019-04-30 NOTE — Progress Notes (Signed)
   Subjective:    Patient ID: Melanie Cruz, female    DOB: [birth year], 24 y.o.   MRN: [id number]  HPI  Virtual Visit via Telephone Note  I connected with Melanie Cruz on 04/30/19 at  1:40 PM EDT by telephone and verified that I am speaking with the correct person using two identifiers.  Location: Patient: home Provider: office  No vitals checked per pt   I discussed the limitations, risks, security and privacy concerns of performing an evaluation and management service by telephone and the availability of in person appointments. I also discussed with the patient that there may be a patient responsible charge related to this service. The patient expressed understanding and agreed to proceed.   History of Present Illness:  Pt in with some pain under her rt side rib. Pain is more in front that comes and goes. Not sure if pain after eating. Pt has gallbladder area. No rash to skin. Pt states pain is less than a minute. When pt palpates her ruq has no pain. Last time she had pain earlier today and lasted for about one minute.Pt started yesterday. Pt has no nausea, no vomiting, no cough, no fever, no chills or sweats.  Pt has good appetite.   Pt did drink some alcohol last night.  LMP- one week ago.    Observations/Objective:  General- no acute distress, pleasant, alert, oriented and normal speech.   Assessment and Plan: For abdomen pain, will get labs on Tuesday 8:45 am. Also [place] on Monday. Sorry [place] on different date.  Advise to watch for any pain worsening with eating. Avoid fatty/greasy foods. Avoid alcohol presently.   Watch for any rash on rt side thorax. If occurs let us know.  Update [place] on worsening or changing signs/symptoms.  Follow up date to be determined after lab review.   Mackie Pai, PA-C     Follow Up Instructions:    I discussed the assessment and treatment plan with the patient. The patient was provided an opportunity to ask questions and all were  answered. The patient agreed with the plan and demonstrated an understanding of the instructions.   The patient was advised to call back or seek an in-person evaluation if the symptoms worsen or if the condition fails to improve as anticipated.  I provided 25 minutes of non-face-to-face time during this encounter.   Mackie Pai, PA-C    Review of Systems     Objective:   Physical Exam        Assessment & Plan:

## 2019-04-30 NOTE — Patient Instructions (Signed)
For abdomen pain, will get labs on Tuesday 8:45 am. Also [place] on Monday. Sorry [place] on different date. Tomorrow 11 am.  Advise to watch for any pain worsening with eating. Avoid fatty/greasy foods. Avoid alcohol presently.   Watch for any rash on rt side thorax. If occurs let us know.  Update [place] on worsening or changing signs/symptoms.  Follow up date to be determined after lab review.

## 2019-05-01 ENCOUNTER — Ambulatory Visit (HOSPITAL_BASED_OUTPATIENT_CLINIC_OR_DEPARTMENT_OTHER): Admission: RE | Admit: 2019-05-01 | Payer: 59 | Source: Ambulatory Visit

## 2019-05-04 ENCOUNTER — Other Ambulatory Visit: Payer: 59

## 2019-05-23 ENCOUNTER — Encounter (HOSPITAL_BASED_OUTPATIENT_CLINIC_OR_DEPARTMENT_OTHER): Payer: Self-pay | Admitting: Emergency Medicine

## 2019-05-23 ENCOUNTER — Other Ambulatory Visit: Payer: Self-pay

## 2019-05-23 DIAGNOSIS — R0789 Other chest pain: Secondary | ICD-10-CM | POA: Insufficient documentation

## 2019-05-23 DIAGNOSIS — F17210 Nicotine dependence, cigarettes, uncomplicated: Secondary | ICD-10-CM | POA: Diagnosis not present

## 2019-05-23 NOTE — ED Triage Notes (Signed)
Reports left sided discomfort for the last two weeks that comes and goes.  Denied having any other symptoms.

## 2019-05-24 ENCOUNTER — Emergency Department (HOSPITAL_BASED_OUTPATIENT_CLINIC_OR_DEPARTMENT_OTHER): Payer: 59

## 2019-05-24 ENCOUNTER — Emergency Department (HOSPITAL_BASED_OUTPATIENT_CLINIC_OR_DEPARTMENT_OTHER)
Admission: EM | Admit: 2019-05-24 | Discharge: 2019-05-24 | Disposition: A | Discharge status: Home / Self Care | Payer: 59 | Attending: Emergency Medicine | Admitting: Emergency Medicine

## 2019-05-24 DIAGNOSIS — R0789 Other chest pain: Secondary | ICD-10-CM

## 2019-05-24 LAB — CBC
HCT: 36.6 % (ref 36.0–46.0)
Hemoglobin: 11.6 g/dL — ABNORMAL LOW (ref 12.0–15.0)
MCH: 26.2 pg (ref 26.0–34.0)
MCHC: 31.7 g/dL (ref 30.0–36.0)
MCV: 82.6 fL (ref 80.0–100.0)
Platelets: 211 10*3/uL (ref 150–400)
RBC: 4.43 MIL/uL (ref 3.87–5.11)
RDW: 14.4 % (ref 11.5–15.5)
WBC: 9.6 10*3/uL (ref 4.0–10.5)
nRBC: 0 % (ref 0.0–0.2)

## 2019-05-24 LAB — PREGNANCY, URINE: Preg Test, Ur: NEGATIVE

## 2019-05-24 LAB — BASIC METABOLIC PANEL
Anion gap: 9 (ref 5–15)
BUN: 16 mg/dL (ref 6–20)
CO2: 21 mmol/L — ABNORMAL LOW (ref 22–32)
Calcium: 8.5 mg/dL — ABNORMAL LOW (ref 8.9–10.3)
Chloride: 106 mmol/L (ref 98–111)
Creatinine, Ser: 0.67 mg/dL (ref 0.44–1.00)
GFR calc Af Amer: >60 mL/min (ref >60)
GFR calc non Af Amer: >60 mL/min (ref >60)
Glucose, Bld: 127 mg/dL — ABNORMAL HIGH (ref 70–99)
Potassium: 3.3 mmol/L — ABNORMAL LOW (ref 3.5–5.1)
Sodium: 136 mmol/L (ref 135–145)

## 2019-05-24 LAB — TROPONIN I (HIGH SENSITIVITY): Troponin I (High Sensitivity): 2 ng/L (ref <18)

## 2019-05-24 MED ORDER — NAPROXEN 500 MG PO TABS: 500.0000 mg | ORAL_TABLET | Freq: Two times a day (BID) | ORAL | 0 refills | Status: DC

## 2019-05-24 MED ORDER — SODIUM CHLORIDE 0.9% FLUSH
3.0000 mL | Freq: Once | INTRAVENOUS | Status: DC
  Filled 2019-05-24: qty 3

## 2019-05-24 NOTE — ED Provider Notes (Signed)
[facility] EMERGENCY DEPARTMENT Provider Note   CSN: [id number] Arrival date & time: 05/23/19  2331     History Chief Complaint  Patient presents with  . Chest Pain    Melanie Cruz is a 24 y.o. female.  HPI     This is a 24 year old female with a history of migraines who presents with left-sided chest pain.  Patient reports 2-week history of intermittent chest pain.  She reports that it comes quickly and self resolves.  Currently she is pain-free.  She has not taken anything for her symptoms.  It is not associated with movement or exertion.  It is not associated with deep breathing.  No history of blood clots, recent travel, recent hospitalization, estrogen use.  She is not noted any rashes.  No fevers, cough, shortness of breath, nausea, vomiting, diarrhea.  Past Medical History:  Diagnosis Date  . Migraine     Patient Active Problem List   Diagnosis Date Noted  . 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 Tobacco abuse counseling 03/13/2017    Past Surgical History:  Procedure Laterality Date  . NO PAST SURGERIES       OB History    Gravida  2   Para  1   Term  1   Preterm      AB  1   Living  1     SAB      TAB      Ectopic      Multiple  0   Live Births  1           Family History  Problem Relation Age of Onset  . Hypertension Mother   . Breast cancer Paternal Aunt        age at onset 5's    Social History   Tobacco Use  . Smoking status: Current Every Day Smoker    Packs/day: 0.50    Years: 5.00    Pack years: 2.50    Types: Cigarettes  . Smokeless tobacco: Never Used  Substance Use Topics  . Alcohol use: Yes  . Drug use: Not Currently    Types: Marijuana    Comment: occasional-last use 05/23/2017    Home Medications Prior to Admission medications   Medication Sig Start Date End Date Taking? Authorizing Provider  cephALEXin (KEFLEX) 500 MG capsule Take 1 capsule (500 mg total) by mouth 2 (two) times  daily. 03/10/19   Saguier, Percell Miller, PA-C  doxycycline (VIBRA-TABS) 100 MG tablet Take 1 tablet (100 mg total) by mouth 2 (two) times daily. 02/19/19   Saguier, Percell Miller, PA-C  naproxen (NAPROSYN) 500 MG tablet Take 1 tablet (500 mg total) by mouth 2 (two) times daily. 05/24/19   Mansel Strother, Barbette Hair, MD  nicotine (NICODERM CQ - DOSED IN MG/24 HOURS) 21 mg/24hr patch Place 1 patch (21 mg total) onto the skin daily. 03/15/19   Saguier, Percell Miller, PA-C  predniSONE (STERAPRED UNI-PAK 21 TAB) 10 MG (21) TBPK tablet Taper over 6 days. 03/10/19   Saguier, Percell Miller, PA-C  valACYclovir (VALTREX) 1000 MG tablet Take 1 tablet (1,000 mg total) by mouth 2 (two) times daily. 03/10/19   Saguier, Percell Miller, PA-C    Allergies    Patient has no known allergies.  Review of Systems   Review of Systems  Constitutional: Negative for fever.  Respiratory: Negative for shortness of breath.   Cardiovascular: Positive for chest pain. Negative for leg swelling.  Gastrointestinal: Negative for abdominal pain, nausea and vomiting.  Genitourinary: Negative  for dysuria.  Skin: Negative for rash.  All other systems reviewed and are negative.   Physical Exam Updated Vital Signs BP 116/80   Pulse 77   Temp 98.6 F (37 C) (Oral)   Resp (!) 21   Ht 1.626 m (5\' 4" )   Wt 104.3 kg   LMP 04/26/2019   SpO2 100%   BMI 39.48 kg/m   Physical Exam Vitals and nursing note reviewed.  Constitutional:      Appearance: She is well-developed. She is obese. She is not ill-appearing.  HENT:     Head: Normocephalic and atraumatic.  Eyes:     Pupils: Pupils are equal, round, and reactive to light.  Cardiovascular:     Rate and Rhythm: Normal rate and regular rhythm.     Heart sounds: Normal heart sounds.  Pulmonary:     Effort: Pulmonary effort is normal. No respiratory distress.     Breath sounds: No wheezing.  Chest:     Chest wall: Tenderness present. No crepitus.  Abdominal:     General: Bowel sounds are normal.     Palpations: Abdomen  is soft.  Musculoskeletal:     Cervical back: Neck supple.     Right lower leg: No tenderness. No edema.     Left lower leg: No tenderness. No edema.  Skin:    General: Skin is warm and dry.     Findings: No rash.  Neurological:     Mental Status: She is alert and oriented to person, place, and time.  Psychiatric:        Mood and Affect: Mood normal.     ED Results / Procedures / Treatments   Labs (all labs ordered are listed, but only abnormal results are displayed) Labs Reviewed  BASIC METABOLIC PANEL - Abnormal; Notable for the following components:      Result Value   Potassium 3.3 (*)    CO2 21 (*)    Glucose, Bld 127 (*)    Calcium 8.5 (*)    All other components within normal limits  CBC - Abnormal; Notable for the following components:   Hemoglobin 11.6 (*)    All other components within normal limits  PREGNANCY, URINE  TROPONIN I (HIGH SENSITIVITY)    EKG EKG Interpretation  Date/Time:  Sunday May 23 2019 23:45:40 EDT Ventricular Rate:  87 PR Interval:  152 QRS Duration: 80 QT Interval:  374 QTC Calculation: 450 R Axis:   68 Text Interpretation: Normal sinus rhythm Nonspecific T wave abnormality Abnormal ECG Confirmed by Thayer Jew [phone]) on 05/24/2019 1:00:03 AM   Radiology DG Chest 2 View  Result Date: 05/24/2019 CLINICAL DATA:  Left-sided chest pain EXAM: CHEST - 2 VIEW COMPARISON:  None. FINDINGS: The heart size and mediastinal contours are within normal limits. Both lungs are clear. The visualized skeletal structures are unremarkable. IMPRESSION: No active cardiopulmonary disease. Electronically Signed   By: Ulyses Jarred M.D.   On: 05/24/2019 00:29    Procedures Procedures (including critical care time)  Medications Ordered in ED Medications  sodium chloride flush (NS) 0.9 % injection 3 mL (3 mLs Intravenous Not Given 05/24/19 0124)    ED Course  I have reviewed the triage vital signs and the nursing notes.  Pertinent labs & imaging  results that were available during my care of the patient were reviewed by me and considered in my medical decision making (see chart for details).    MDM Rules/Calculators/A&P  Patient presents with left chest wall pain.  Intermittent nature.  She is nontoxic and vital signs are reassuring.  She has reproducible tenderness to palpation without crepitus or rash on exam.  EKG without ischemic or arrhythmic changes.  Doubt ACS given age and history.  Troponin negative.  Feel this is reasonable screening and that she does not need a second troponin.  She is PERC negative and doubt PE.  CBC and BMP reviewed from triage and without significant metabolic derangement.  Chest x-ray shows no evidence of pneumothorax or pneumonia.  This was independently reviewed by myself.  Given reproducible nature of pain, highly suspect musculoskeletal etiology.  Low suspicion for acute emergent process.  Will start on naproxen.  After history, exam, and medical workup I feel the patient has been appropriately medically screened and is safe for discharge home. Pertinent diagnoses were discussed with the patient. Patient was given return precautions.   Final Clinical Impression(s) / ED Diagnoses Final diagnoses:  Chest wall pain    Rx / DC Orders ED Discharge Orders         Ordered    naproxen (NAPROSYN) 500 MG tablet  2 times daily     05/24/19 0129           Rosalina Dingwall, Barbette Hair, MD 05/24/19 [id number]

## 2019-05-24 NOTE — Discharge Instructions (Addendum)
You were seen today for left-sided chest wall pain.  Start naproxen daily to see if this helps.  This is likely musculoskeletal in nature.

## 2019-05-27 ENCOUNTER — Encounter: Payer: Self-pay | Admitting: Medical

## 2019-05-28 ENCOUNTER — Ambulatory Visit: Payer: 59 | Admitting: Medical

## 2019-05-28 DIAGNOSIS — Z0289 Encounter for other administrative examinations: Secondary | ICD-10-CM

## 2019-06-01 ENCOUNTER — Other Ambulatory Visit: Payer: Self-pay

## 2019-06-01 ENCOUNTER — Ambulatory Visit (INDEPENDENT_AMBULATORY_CARE_PROVIDER_SITE_OTHER): Payer: 59 | Admitting: Medical

## 2019-06-01 VITALS — BP 109/53 | HR 59 | Resp 18 | Ht 64.0 in | Wt 221.0 lb

## 2019-06-01 DIAGNOSIS — H9202 Otalgia, left ear: Secondary | ICD-10-CM

## 2019-06-01 DIAGNOSIS — H6092 Unspecified otitis externa, left ear: Secondary | ICD-10-CM

## 2019-06-01 DIAGNOSIS — R0981 Nasal congestion: Secondary | ICD-10-CM

## 2019-06-01 MED ORDER — FLUTICASONE PROPIONATE 50 MCG/ACT NA SUSP: 2.0000 | Freq: Every day | NASAL | 1 refills | Status: DC

## 2019-06-01 MED ORDER — CYCLOBENZAPRINE HCL 5 MG PO TABS: 5.0000 mg | ORAL_TABLET | Freq: Every day | ORAL | 0 refills | Status: DC

## 2019-06-01 MED ORDER — NEOMYCIN-POLYMYXIN-HC 3.5-10000-1 OT SOLN: 3.0000 [drp] | Freq: Four times a day (QID) | OTIC | 0 refills | Status: DC

## 2019-06-01 NOTE — Patient Instructions (Addendum)
You have some ear pain and by exam mild inflamed canal but no wax and no ear drum infection. Will rx cortisporin otic drops.  For nasal congestion rx flonase. This will reduce potential pressure in eustachian tube.  For lower trapezius pain and tensions. Will rx low dose flexeril to use only at night. Update me in about 5-7 days if pain in lower trapezius area  Resolved.  Follow up 7-10 days or as needed

## 2019-06-01 NOTE — Progress Notes (Signed)
Subjective:    Patient ID: Melanie Cruz, female    DOB: 06/12/1995, 24 y.o.   MRN: [id number]  HPI Pt in with left ear pain for about one week. Feels like in canal. She tried to irrigate ear and did not get out a lot of wax.   Pt had concern about something in her ear.  No recent swimming.   Maybe faint nasal congestion. No fever, no chills, no muscles aches, no fatigue and no loss of smell. No cough or sob.  Recent lower trapezius pain and feels tense. She wants to try muscle relaxant.  Pt has to pick up her toddler 23 lbs all the time.  Review of Systems  Constitutional: Negative for chills, fatigue and fever.  HENT: Positive for ear pain.   Respiratory: Negative for cough, chest tightness, shortness of breath and wheezing.   Cardiovascular: Negative for chest pain and palpitations.  Gastrointestinal: Negative for abdominal pain.  Musculoskeletal: Negative for gait problem.       See hpi.  Skin: Negative for rash.  Hematological: Negative for adenopathy. Does not bruise/bleed easily.  Psychiatric/Behavioral: Negative for behavioral problems, confusion, dysphoric mood and sleep disturbance. The patient is not nervous/anxious.     Past Medical History:  Diagnosis Date  . Migraine      Social History   Socioeconomic History  . Marital status: Single    Spouse name: Not on file  . Number of children: Not on file  . Years of education: Not on file  . Highest education level: Not on file  Occupational History  . Not on file  Tobacco Use  . Smoking status: Current Every Day Smoker    Packs/day: 0.50    Years: 5.00    Pack years: 2.50    Types: Cigarettes  . Smokeless tobacco: Never Used  Substance and Sexual Activity  . Alcohol use: Yes  . Drug use: Not Currently    Types: Marijuana    Comment: occasional-last use 05/23/2017  . Sexual activity: Yes    Birth control/protection: None  Other Topics Concern  . Not on file  Social History Narrative  . Not on file     Social Determinants of Health   Financial Resource Strain:   . Difficulty of Paying Living Expenses:   Food Insecurity:   . Worried About Charity fundraiser in the Last Year:   . Arboriculturist in the Last Year:   Transportation Needs:   . Film/video editor (Medical):   Marland Kitchen Lack of Transportation (Non-Medical):   Physical Activity:   . Days of Exercise per Week:   . Minutes of Exercise per Session:   Stress:   . Feeling of Stress :   Social Connections:   . Frequency of Communication with Friends and Family:   . Frequency of Social Gatherings with Friends and Family:   . Attends Religious Services:   . Active Member of Clubs or Organizations:   . Attends Archivist Meetings:   Marland Kitchen Marital Status:   Intimate Partner Violence:   . Fear of Current or Ex-Partner:   . Emotionally Abused:   Marland Kitchen Physically Abused:   . Sexually Abused:     Past Surgical History:  Procedure Laterality Date  . NO PAST SURGERIES      Family History  Problem Relation Age of Onset  . Hypertension Mother   . Breast cancer Paternal Aunt        age at onset 26's  No Known Allergies  Current Outpatient Medications on File Prior to Visit  Medication Sig Dispense Refill  . cephALEXin (KEFLEX) 500 MG capsule Take 1 capsule (500 mg total) by mouth 2 (two) times daily. (Patient not taking: Reported on 06/01/2019) 20 capsule 0  . doxycycline (VIBRA-TABS) 100 MG tablet Take 1 tablet (100 mg total) by mouth 2 (two) times daily. (Patient not taking: Reported on 06/01/2019) 14 tablet 0  . naproxen (NAPROSYN) 500 MG tablet Take 1 tablet (500 mg total) by mouth 2 (two) times daily. (Patient not taking: Reported on 06/01/2019) 30 tablet 0  . nicotine (NICODERM CQ - DOSED IN MG/24 HOURS) 21 mg/24hr patch Place 1 patch (21 mg total) onto the skin daily. (Patient not taking: Reported on 06/01/2019) 28 patch 0  . predniSONE (STERAPRED UNI-PAK 21 TAB) 10 MG (21) TBPK tablet Taper over 6 days. (Patient not  taking: Reported on 06/01/2019) 21 tablet 0  . valACYclovir (VALTREX) 1000 MG tablet Take 1 tablet (1,000 mg total) by mouth 2 (two) times daily. (Patient not taking: Reported on 06/01/2019) 20 tablet 0   No current facility-administered medications on file prior to visit.    BP (!) 109/53 (BP Location: Right Arm, Patient Position: Sitting, Cuff Size: Large)   Pulse (!) 59   Resp 18   Ht 5\' 4"  (1.626 m)   Wt 221 lb (100.2 kg)   LMP 05/27/2019   SpO2 100%   BMI 37.93 kg/m       Objective:   Physical Exam  General- No acute distress. Pleasant patient. Neck- Full range of motion, no jvd Lungs- Clear, even and unlabored. Heart- regular rate and rhythm. Neurologic- CNII- XII grossly intact. heent- no frontal or maxillary sinus pressure. Left ear canal mild swollen. No was. Rt ear canal moderate wax.          Assessment & Plan:  You have some ear pain and by exam mild inflamed canal but no wax and no ear drum infection. Will rx cortisporin otic drops.  For nasal congestion rx flonase. This will reduce potential pressure in eustachian tube.  For lower trapezius pain and tensions. Will rx low dose flexeril to use only at night. Update me in about 5-7 days if pain in lower trapezius area  Resolved.  Follow up 7-10 days or as needed  Time spent with patient today was 25  minutes which consisted of chart review, discussing diagnosis, work up treatment and documentation.  Mackie Pai, PA-C

## 2019-06-14 ENCOUNTER — Encounter: Payer: Self-pay | Admitting: Medical

## 2019-06-28 ENCOUNTER — Encounter: Payer: Self-pay | Admitting: Medical

## 2019-06-30 ENCOUNTER — Ambulatory Visit (INDEPENDENT_AMBULATORY_CARE_PROVIDER_SITE_OTHER): Payer: 59 | Admitting: Medical

## 2019-06-30 ENCOUNTER — Other Ambulatory Visit: Payer: Self-pay

## 2019-06-30 VITALS — BP 118/68 | HR 75 | Resp 18 | Ht 64.0 in | Wt 228.0 lb

## 2019-06-30 DIAGNOSIS — G43809 Other migraine, not intractable, without status migrainosus: Secondary | ICD-10-CM

## 2019-06-30 DIAGNOSIS — F17200 Nicotine dependence, unspecified, uncomplicated: Secondary | ICD-10-CM

## 2019-06-30 DIAGNOSIS — Z716 Tobacco abuse counseling: Secondary | ICD-10-CM

## 2019-06-30 MED ORDER — BUPROPION HCL ER (XL) 150 MG PO TB24: 150.0000 mg | ORAL_TABLET | Freq: Every day | ORAL | 0 refills | Status: DC

## 2019-06-30 MED ORDER — SUMATRIPTAN SUCCINATE 50 MG PO TABS: ORAL_TABLET | ORAL | 0 refills | Status: DC

## 2019-06-30 NOTE — Progress Notes (Signed)
Subjective:    Patient ID: Melanie Cruz, female    DOB: 05/12/1995, 24 y.o.   MRN: [id number]  HPI  Pt in for hx of migraine ha since 24 yo.   She states recently had ha that was moderate-severe on Saturday and Sunday.   Both ha occurred later in the day. Pt states on Saturday had not eaten very little that day. She was drinking alcohol and smoking at onset of ha. She took tylenol and it took 3 hours for ha to stop. Went away then ha came back on Sunday.   She was light sensitive on Saturday when ha was worse. Also sound sensitive. No nausea or vomiting. Sunday mild ha and tylenol stopped ha.   Pt states in past never used migraine specific medications.  In past has been sporadic. But when gets will occur briefly. This past 2 weeks little more frequent than in past.   She has never seen specialist.  Pt lmp- yesterday started.  Pt is trying to quit smoking. Past 2 days only smoked 4 cigarettes. In past 1/2 pack daily.    No history of seizures and no eating disorder. Pt past tried wellbutrin.    Review of Systems  Constitutional: Negative for chills, fatigue and fever.  Respiratory: Negative for cough, chest tightness, shortness of breath and wheezing.   Cardiovascular: Negative for chest pain and palpitations.  Gastrointestinal: Negative for abdominal pain.  Musculoskeletal: Negative for back pain.  Skin: Negative for rash.  Neurological: Negative for dizziness, weakness, numbness and headaches.  Hematological: Negative for adenopathy. Does not bruise/bleed easily.  Psychiatric/Behavioral: Negative for behavioral problems and decreased concentration. The patient is not nervous/anxious.     Past Medical History:  Diagnosis Date  . Migraine      Social History   Socioeconomic History  . Marital status: Single    Spouse name: Not on file  . Number of children: Not on file  . Years of education: Not on file  . Highest education level: Not on file  Occupational  History  . Not on file  Tobacco Use  . Smoking status: Current Every Day Smoker    Packs/day: 0.50    Years: 5.00    Pack years: 2.50    Types: Cigarettes  . Smokeless tobacco: Never Used  Substance and Sexual Activity  . Alcohol use: Yes  . Drug use: Not Currently    Types: Marijuana    Comment: occasional-last use 05/23/2017  . Sexual activity: Yes    Birth control/protection: None  Other Topics Concern  . Not on file  Social History Narrative  . Not on file   Social Determinants of Health   Financial Resource Strain:   . Difficulty of Paying Living Expenses:   Food Insecurity:   . Worried About Charity fundraiser in the Last Year:   . Arboriculturist in the Last Year:   Transportation Needs:   . Film/video editor (Medical):   Marland Kitchen Lack of Transportation (Non-Medical):   Physical Activity:   . Days of Exercise per Week:   . Minutes of Exercise per Session:   Stress:   . Feeling of Stress :   Social Connections:   . Frequency of Communication with Friends and Family:   . Frequency of Social Gatherings with Friends and Family:   . Attends Religious Services:   . Active Member of Clubs or Organizations:   . Attends Archivist Meetings:   Marland Kitchen Marital Status:  Intimate Partner Violence:   . Fear of Current or Ex-Partner:   . Emotionally Abused:   Marland Kitchen Physically Abused:   . Sexually Abused:     Past Surgical History:  Procedure Laterality Date  . NO PAST SURGERIES      Family History  Problem Relation Age of Onset  . Hypertension Mother   . Breast cancer Paternal Aunt        age at onset 66's    No Known Allergies  Current Outpatient Medications on File Prior to Visit  Medication Sig Dispense Refill  . doxycycline (VIBRA-TABS) 100 MG tablet Take 1 tablet (100 mg total) by mouth 2 (two) times daily. 14 tablet 0  . fluticasone (FLONASE) 50 MCG/ACT nasal spray Place 2 sprays into both nostrils daily. 16 g 1  . naproxen (NAPROSYN) 500 MG tablet  Take 1 tablet (500 mg total) by mouth 2 (two) times daily. 30 tablet 0  . neomycin-polymyxin-hydrocortisone (CORTISPORIN) OTIC solution Place 3 drops into the left ear 4 (four) times daily. 10 mL 0  . nicotine (NICODERM CQ - DOSED IN MG/24 HOURS) 21 mg/24hr patch Place 1 patch (21 mg total) onto the skin daily. 28 patch 0  . predniSONE (STERAPRED UNI-PAK 21 TAB) 10 MG (21) TBPK tablet Taper over 6 days. 21 tablet 0  . valACYclovir (VALTREX) 1000 MG tablet Take 1 tablet (1,000 mg total) by mouth 2 (two) times daily. 20 tablet 0  . cephALEXin (KEFLEX) 500 MG capsule Take 1 capsule (500 mg total) by mouth 2 (two) times daily. (Patient not taking: Reported on 06/01/2019) 20 capsule 0  . cyclobenzaprine (FLEXERIL) 5 MG tablet Take 1 tablet (5 mg total) by mouth at bedtime. (Patient not taking: Reported on 06/30/2019) 4 tablet 0   No current facility-administered medications on file prior to visit.    BP 118/68 (BP Location: Left Arm, Patient Position: Sitting, Cuff Size: Large)   Pulse 75   Resp 18   Ht 5\' 4"  (1.626 m)   Wt 228 lb (103.4 kg)   LMP 06/29/2019   SpO2 100%   BMI 39.14 kg/m       Objective:   Physical Exam   General Mental Status- Alert. General Appearance- Not in acute distress.   Skin General: Color- Normal Color. Moisture- Normal Moisture.  Neck Carotid Arteries- Normal color. Moisture- Normal Moisture. No carotid bruits. No JVD.  Chest and Lung Exam Auscultation: Breath Sounds:-Normal.  Cardiovascular Auscultation:Rythm- Regular. Murmurs & Other Heart Sounds:Auscultation of the heart reveals- No Murmurs.   Neurologic Cranial Nerve exam:- CN III-XII intact(No nystagmus), symmetric smile. Strength:- 5/5 equal and symmetric strength both upper and lower extremities.     Assessment & Plan:  You have history of migraine type headaches in the past and slight increase frequency over the last 2 weeks.  History of not being on any moderate specific medication.   Will prescribe Imitrex today and see how you do.  Rx advisement given.  Went ahead and referred to neurologist at your request.  Referral might take some time.  If headaches worsening or changing in the interim please let me know.  For history of smoking and desire for smoking cessation, I did prescribe Wellbutrin.  Taper off as discussed.  Also be careful not to overeat as you stopping smoking.  Recommend daily exercise. Counseled on smoking cessation. Complication/impact on health.  Follow up one month or as needed  Mackie Pai, PA-C   Time spent with patient today was 30  minutes  which consisted of chart review, discussing diagnoses, referral,  Treatment, counseling on smoking cessatino  and documentation.

## 2019-06-30 NOTE — Patient Instructions (Addendum)
You have history of migraine type headaches in the past and slight increase frequency over the last 2 weeks.  History of not being on any moderate specific medication.  Will prescribe Imitrex today and see how you do.  Rx advisement given.  Went ahead and referred to neurologist at your request.  Referral might take some time.  If headaches worsening or changing in the interim please let me know.  For history of smoking and desire for smoking cessation, I did prescribe Wellbutrin.  Taper off as discussed.  Also be careful not to overeat as you stopping smoking.  Recommend daily exercise.   Follow up one month or as needed

## 2019-07-05 ENCOUNTER — Other Ambulatory Visit: Payer: Self-pay

## 2019-07-05 ENCOUNTER — Telehealth (INDEPENDENT_AMBULATORY_CARE_PROVIDER_SITE_OTHER): Payer: 59 | Admitting: Medical

## 2019-07-05 ENCOUNTER — Encounter: Payer: Self-pay | Admitting: Medical

## 2019-07-05 DIAGNOSIS — F419 Anxiety disorder, unspecified: Secondary | ICD-10-CM | POA: Diagnosis not present

## 2019-07-05 DIAGNOSIS — M79672 Pain in left foot: Secondary | ICD-10-CM | POA: Diagnosis not present

## 2019-07-05 DIAGNOSIS — N898 Other specified noninflammatory disorders of vagina: Secondary | ICD-10-CM

## 2019-07-05 DIAGNOSIS — Z113 Encounter for screening for infections with a predominantly sexual mode of transmission: Secondary | ICD-10-CM | POA: Diagnosis not present

## 2019-07-05 NOTE — Progress Notes (Signed)
   Subjective:    Patient ID: Melanie Cruz, female    DOB: 12/31/1995, 24 y.o.   MRN: [id number]  HPI  Virtual Visit via Video Note  I connected with Melanie Cruz on 07/05/19 at  2:20 PM EDT by a video enabled telemedicine application and verified that I am speaking with the correct person using two identifiers.  Location: Patient: home Provider: office   I discussed the limitations of evaluation and management by telemedicine and the availability of in person appointments. The patient expressed understanding and agreed to proceed.  Pt did not check her bp or pulse. Today.  History of Present Illness:   Pt in states she some mild small lump out aspect of her distal lateral like soft circular lump last night. She kept picking at area but this morning she can't feel today.  Pt got concerned after she saw some maggots in her humidiyer. She thought somehow maggot may him gotten in her leg.  Pt got anxious about possibility of being larvae since her humidifyer was very dirty. Pt tends to be pessimistic and get worried easy per her report.  Pt also wants screening std test.   On and of first metatarsal area pain on and off.    Observations/Objective: General-no acute distress, pleasant, oriented. Lungs- on inspection lungs appear unlabored. Neck- no tracheal deviation or jvd on inspection. Neuro- gross motor function appears intact.  On video infection left lower extremity-I did not appreciate any abnormality in left lateral calf area.  Noted I see any abnormality on the left foot.  Assessment and Plan: Patient had recent report of possible small lump left lateral calf area.  I did not see any abnormality on inspection during video visit.  However did offer you to come in tomorrow for a brief exam to fill the area.  It is possible you have a small early lipoma or ganglion cyst.  Recent stress anxiety over the above.  We will need to follow you closely and see if he has persisting  anxiety over issues.  If so then might consider prescribing low-dose BuSpar.  You mentioned concern for STD screening so placed future order for urine ancillary studies, RPR and HIV.  Patient also mentioned at the end of the interview about having some mild left foot pain.  On and off not present during exam.  If the pain returns let me know and I will get x-ray of the left foot and inflammatory lab studies.   Mackie Pai, PA-C   Time spent with patient today was  25 minutes which consisted of chart revdiew, discussing diagnosis, work up treatment and documentation.  Follow-up date to be determined after lab review.  Follow Up Instructions:    I discussed the assessment and treatment plan with the patient. The patient was provided an opportunity to ask questions and all were answered. The patient agreed with the plan and demonstrated an understanding of the instructions.   The patient was advised to call back or seek an in-person evaluation if the symptoms worsen or if the condition fails to improve as anticipated.    Mackie Pai, PA-C   Review of Systems     Objective:   Physical Exam        Assessment & Plan:

## 2019-07-05 NOTE — Patient Instructions (Signed)
Patient had recent report of possible small lump left lateral calf area.  I did not see any abnormality on inspection during video visit.  However did offer you to come in tomorrow for a brief exam to fill the area.  It is possible you have a small early lipoma or ganglion cyst.  Recent stress anxiety over the above.  We will need to follow you closely and see if he has persisting anxiety over issues.  If so then might consider prescribing low-dose BuSpar.  You mentioned concern for STD screening so placed future order for urine ancillary studies, RPR and HIV.  Patient also mentioned at the end of the interview about having some mild left foot pain.  On and off not present during exam.  If the pain returns let me know and I will get x-ray of the left foot and inflammatory lab studies.   Follow-up date to be determined after lab review.

## 2019-07-08 ENCOUNTER — Encounter: Payer: Self-pay | Admitting: Medical

## 2019-07-09 NOTE — Telephone Encounter (Signed)
Spoke with patient she stated she doesn't have a headache today so she doesn't want to come in & the medication that she was prescribed didn't help so she has been taking tylenol extra strength ... and she also wants to know whenever she has a headache can she just come into the office without a visit for an injection.

## 2019-07-14 ENCOUNTER — Other Ambulatory Visit: Payer: 59

## 2019-07-20 ENCOUNTER — Encounter: Payer: Self-pay | Admitting: Medical

## 2019-07-23 ENCOUNTER — Other Ambulatory Visit: Payer: Self-pay

## 2019-07-23 ENCOUNTER — Telehealth (INDEPENDENT_AMBULATORY_CARE_PROVIDER_SITE_OTHER): Payer: 59 | Admitting: Family Medicine

## 2019-07-23 DIAGNOSIS — R059 Cough, unspecified: Secondary | ICD-10-CM

## 2019-07-23 DIAGNOSIS — N946 Dysmenorrhea, unspecified: Secondary | ICD-10-CM | POA: Diagnosis not present

## 2019-07-23 DIAGNOSIS — R05 Cough: Secondary | ICD-10-CM

## 2019-07-23 NOTE — Progress Notes (Signed)
Chief Complaint  Patient presents with  . Cough    coughing up mucus and seen blood in mucus.   . Abdominal Pain    onset: 1 week ,     Timmy Lyness here for URI complaints. Due to COVID-19 pandemic, we are interacting via telephone. I verified patient's ID using 2 identifiers. Patient agreed to proceed with visit via this method. Patient is at home, I am at office. Patient and I are present for visit.   Duration: 5 days  Associated symptoms: sinus congestion, rhinorrhea and cough; coughed up specks of blood in the mucus Denies: sinus pain, itchy watery eyes, ear pain, ear drainage, sore throat, wheezing, shortness of breath, myalgia and fevers URI s/s's seem to have resolved. She was exposed to someone with covid.  1 wk of abd cramping. Has been using Tylenol w some relief.   Past Medical History:  Diagnosis Date  . Migraine    Exam No conversational dyspnea Age appropriate judgment and insight Nml affect and mood  Cough  Menstrual cramps  1. Resolving. Reassurance for blood in mucus x1 that has resolved. 2. Sounds like her menstrual cramps, though outside of her nml cycle. Cont Tylenol prn. Try naproxen. She has some at home.  Total time: 11 min F/u prn.  Pt voiced understanding and agreement to the plan.  [place], DO 07/23/19 11:51 AM

## 2019-08-05 IMAGING — US US MFM FETAL BPP W/O NON-STRESS
1 series · 7 of 7 positions shown · non-contrast
Comparison: none

[Series 1: us mfm fetal bpp w/o non-stress · 7 acquisitions, 7 frames shown]
[im 1/7]
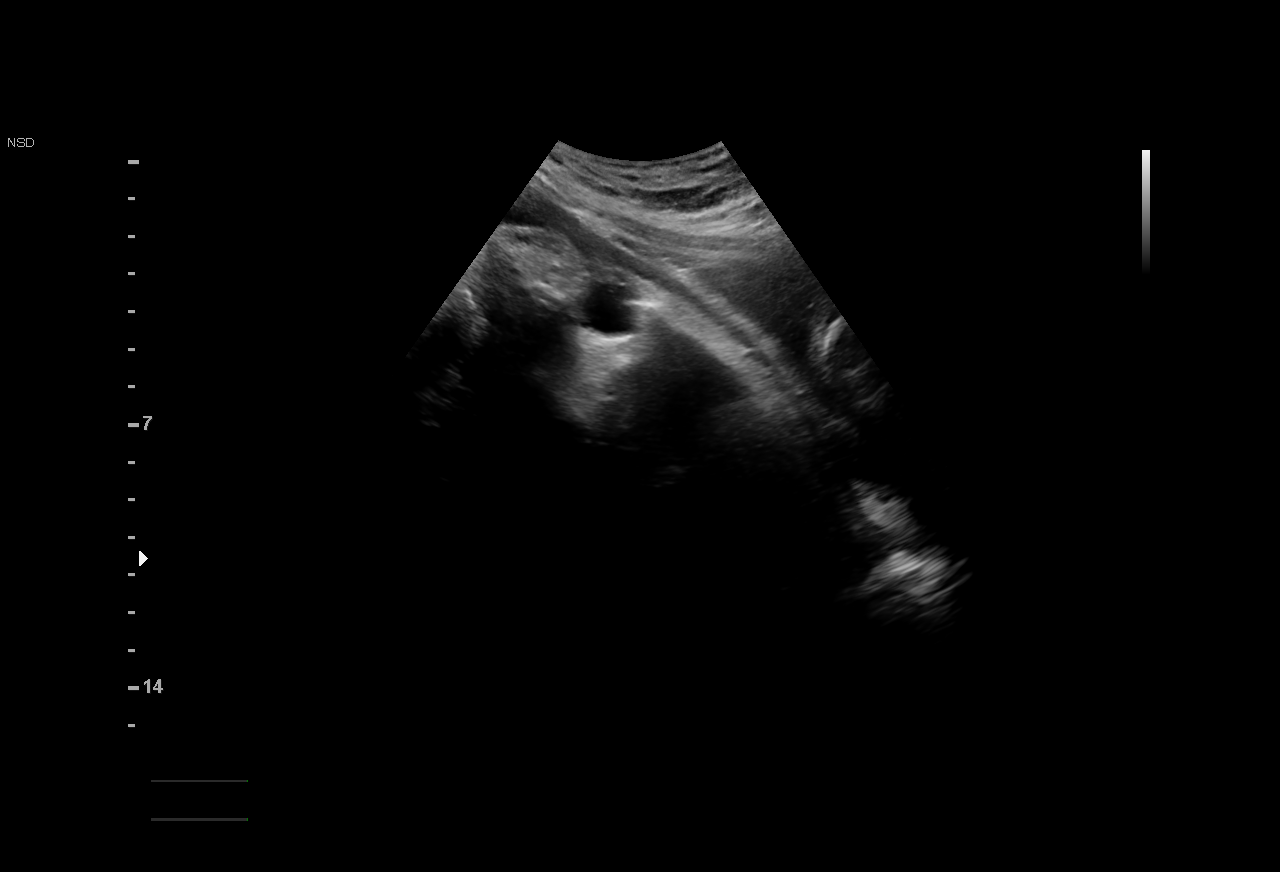
[im 2/7]
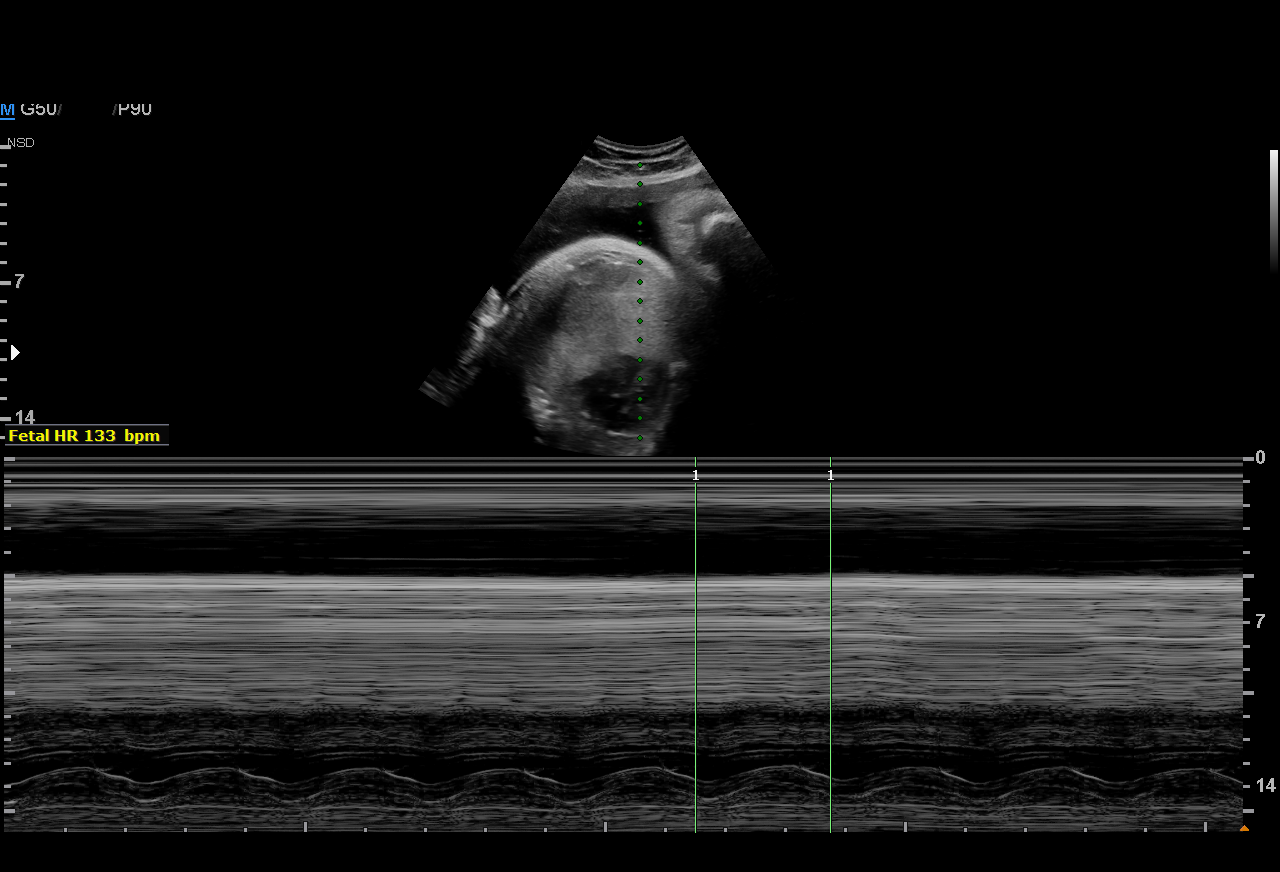
[im 3/7]
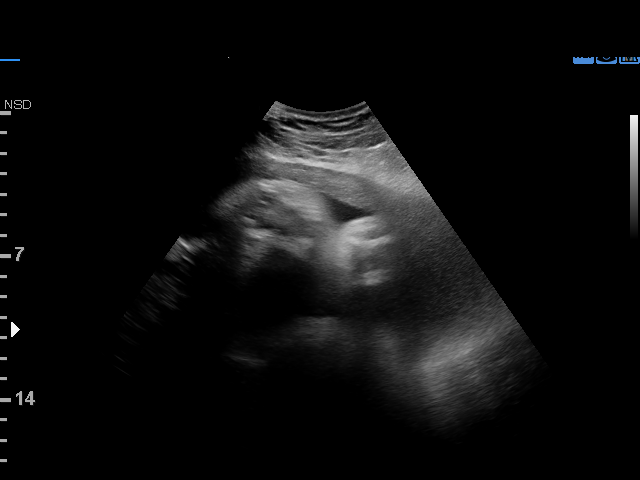
[im 4/7]
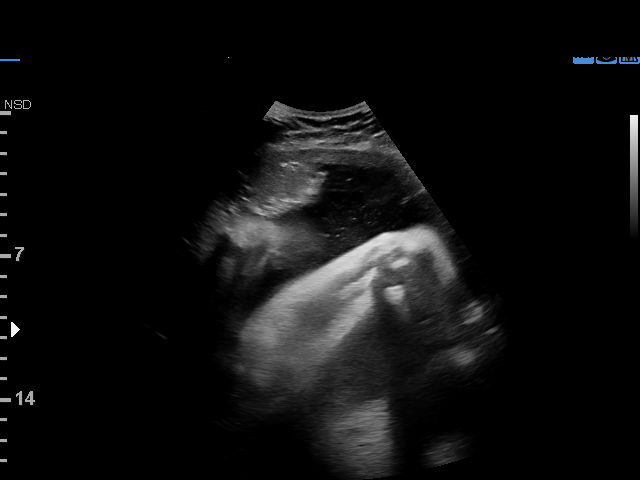
[im 5/7]
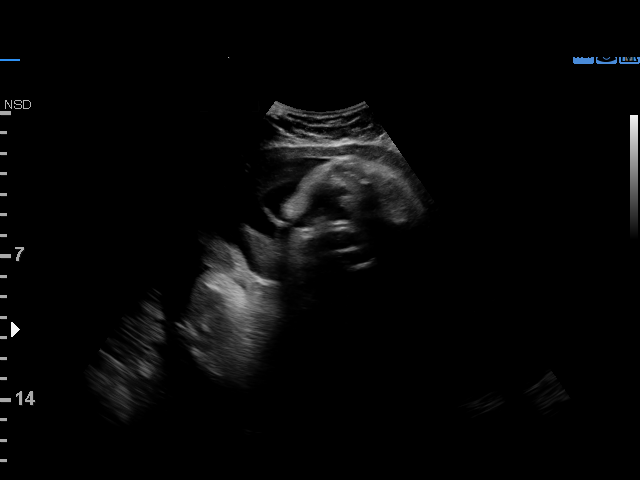
[im 6/7]
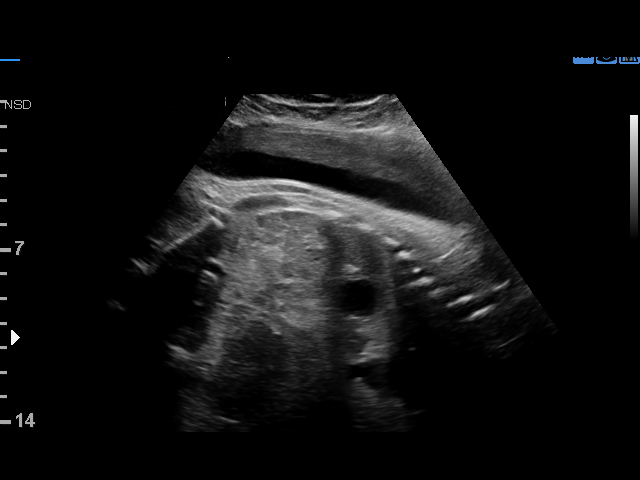
[im 7/7]
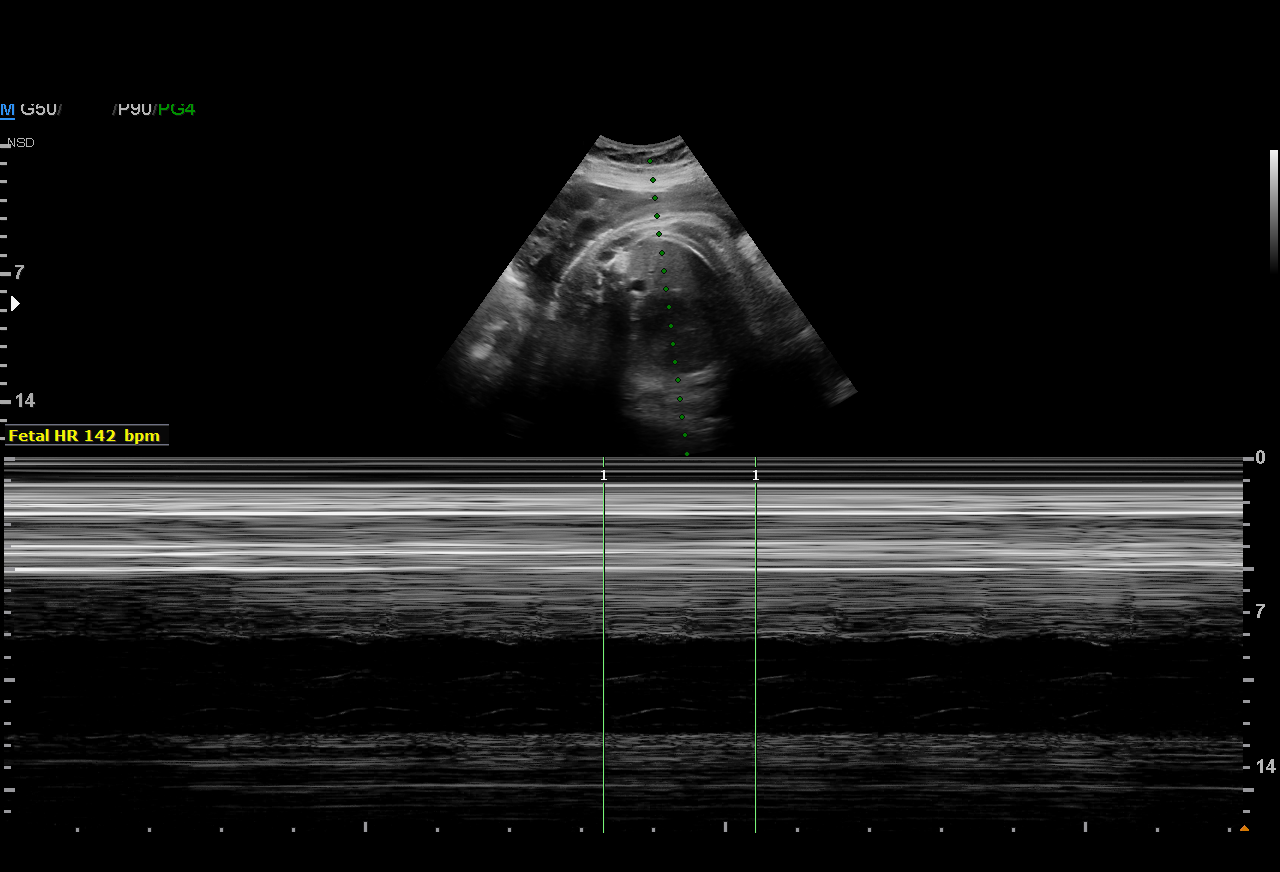

[7 of 7 positions shown; findings below may reference images not displayed]

[REDACTED]
Attending:        Haniffe Benek        Secondary Phy.:    [facility]-
MAU/Triage

Indications

39 weeks gestation of pregnancy
Fetal heart rate decelerations affecting
management of mother
Vaginal bleeding in pregnancy, third trimester
Vital Signs

Height:        5'4"
Fetal Evaluation

Num Of Fetuses:          1
Fetal Heart Rate(bpm):   133
Cardiac Activity:        Observed
Presentation:            Cephalic

Amniotic Fluid
AFI FV:      Within normal limits

AFI Sum(cm)     %Tile       Largest Pocket(cm)
18.35           76

RUQ(cm)       RLQ(cm)       LUQ(cm)        LLQ(cm)
4.34
Biophysical Evaluation

Amniotic F.V:   Within normal limits       F. Tone:         Observed
F. Movement:    Observed                   Score:           [DATE]
F. Breathing:   Observed
OB History

Gravidity:    1
Gestational Age

LMP:           39w 1d        Date:  12/08/16                 EDD:   09/14/17
Best:          39w 1d     Det. By:  LMP  (12/08/16)          EDD:   09/14/17
Impression

Patient was evaluated for c/o vaginal bleeding. Decelerations
were seen on NST.
Amniotic fluid is normal and good fetal activity is seen.
Antenatal testing is reassuring. BPP [DATE].

## 2019-09-05 NOTE — Progress Notes (Deleted)
[facility]    Provider:  Dr Jaynee Eagles Requesting Provider: Elise Benne Primary Care Provider:  Elise Benne  CC:  ***  HPI:  Melanie Cruz is a 24 y.o. female here as requested by Mackie Pai, PA-C for migraine. PMHx migraine. I reviewed Saguier, Percell Miller, PA-C's notes: Patient reported a history of migraine type headaches in the past with increasing frequency over several weeks, she was prescribed Imitrex (she called subsequently and stated that this medication did not work), and she was referred to neurology.  She does have a history also of tobacco abuse.  By report she tends to be anxious, and get easily worried.  I did review notes back 03/27/2019 and did not see any more migraine or headache specific history however she has seenSaguier, Percell Miller, PA-C for multiple different other complaints over 8 appointments since the beginning of the year.  She is also been in the emergency room for chest pain.  I do see brain imaging CT of the head from October 2018 which was normal and ordered due to patient presented to the emergency room with persistent posterior headache over 3 to 4 weeks, these headaches were new at that time, anti-inflammatories did not significantly help, no vision changes, no weakness, denied neck pain, neuro exam was normal.   Reviewed notes, labs and imaging from outside physicians, which showed:    CT Head 10/2016: showed No acute intracranial abnormalities including mass lesion or mass effect, hydrocephalus, extra-axial fluid collection, midline shift, hemorrhage, or acute infarction, large ischemic events (personally reviewed images)    Review of Systems: Patient complains of symptoms per HPI as well as the following symptoms ***. Pertinent negatives and positives per HPI. All others negative.   Social History   Socioeconomic History  . Marital status: Single    Spouse name: Not on file  . Number of children: Not on file  .  Years of education: Not on file  . Highest education level: Not on file  Occupational History  . Not on file  Tobacco Use  . Smoking status: Current Every Day Smoker    Packs/day: 0.50    Years: 5.00    Pack years: 2.50    Types: Cigarettes  . Smokeless tobacco: Never Used  Substance and Sexual Activity  . Alcohol use: Yes  . Drug use: Not Currently    Types: Marijuana    Comment: occasional-last use 05/23/2017  . Sexual activity: Yes    Birth control/protection: None  Other Topics Concern  . Not on file  Social History Narrative  . Not on file   Social Determinants of Health   Financial Resource Strain:   . Difficulty of Paying Living Expenses: Not on file  Food Insecurity:   . Worried About Charity fundraiser in the Last Year: Not on file  . Ran Out of Food in the Last Year: Not on file  Transportation Needs:   . Lack of Transportation (Medical): Not on file  . Lack of Transportation (Non-Medical): Not on file  Physical Activity:   . Days of Exercise per Week: Not on file  . Minutes of Exercise per Session: Not on file  Stress:   . Feeling of Stress : Not on file  Social Connections:   . Frequency of Communication with Friends and Family: Not on file  . Frequency of Social Gatherings with Friends and Family: Not on file  . Attends Religious Services: Not on file  . Active Member of Clubs or  Organizations: Not on file  . Attends Archivist Meetings: Not on file  . Marital Status: Not on file  Intimate Partner Violence:   . Fear of Current or Ex-Partner: Not on file  . Emotionally Abused: Not on file  . Physically Abused: Not on file  . Sexually Abused: Not on file    Family History  Problem Relation Age of Onset  . Hypertension Mother   . Breast cancer Paternal Aunt        age at onset 74's    Past Medical History:  Diagnosis Date  . Migraine     Patient Active Problem List   Diagnosis Date Noted  . Furunculosis of multiple sites  04/15/2017  . Tobacco abuse counseling 03/13/2017    Past Surgical History:  Procedure Laterality Date  . NO PAST SURGERIES      Current Outpatient Medications  Medication Sig Dispense Refill  . buPROPion (WELLBUTRIN XL) 150 MG 24 hr tablet Take 1 tablet (150 mg total) by mouth daily. 30 tablet 0  . fluticasone (FLONASE) 50 MCG/ACT nasal spray Place 2 sprays into both nostrils daily. 16 g 1  . naproxen (NAPROSYN) 500 MG tablet Take 1 tablet (500 mg total) by mouth 2 (two) times daily. 30 tablet 0  . neomycin-polymyxin-hydrocortisone (CORTISPORIN) OTIC solution Place 3 drops into the left ear 4 (four) times daily. 10 mL 0  . nicotine (NICODERM CQ - DOSED IN MG/24 HOURS) 21 mg/24hr patch Place 1 patch (21 mg total) onto the skin daily. 28 patch 0  . SUMAtriptan (IMITREX) 50 MG tablet 1 tab po onset of ha. Repeat in 2 hours if needed. 10 tablet 0  . valACYclovir (VALTREX) 1000 MG tablet Take 1 tablet (1,000 mg total) by mouth 2 (two) times daily. 20 tablet 0   No current facility-administered medications for this visit.    Allergies as of 09/06/2019  . (No Known Allergies)    Vitals: There were no vitals taken for this visit. Last Weight:  Wt Readings from Last 1 Encounters:  06/30/19 228 lb (103.4 kg)   Last Height:   Ht Readings from Last 1 Encounters:  06/30/19 5\' 4"  (1.626 m)     Physical exam: Exam: Gen: NAD, conversant, well nourised, obese, well groomed                     CV: RRR, no MRG. No Carotid Bruits. No peripheral edema, warm, nontender Eyes: Conjunctivae clear without exudates or hemorrhage  Neuro: Detailed Neurologic Exam  Speech:    Speech is normal; fluent and spontaneous with normal comprehension.  Cognition:    The patient is oriented to person, place, and time;     recent and remote memory intact;     language fluent;     normal attention, concentration,     fund of knowledge Cranial Nerves:    The pupils are equal, round, and reactive to  light. The fundi are normal and spontaneous venous pulsations are present. Visual fields are full to finger confrontation. Extraocular movements are intact. Trigeminal sensation is intact and the muscles of mastication are normal. The face is symmetric. The palate elevates in the midline. Hearing intact. Voice is normal. Shoulder shrug is normal. The tongue has normal motion without fasciculations.   Coordination:    Normal finger to nose and heel to shin. Normal rapid alternating movements.   Gait:    Heel-toe and tandem gait are normal.   Motor Observation:  No asymmetry, no atrophy, and no involuntary movements noted. Tone:    Normal muscle tone.    Posture:    Posture is normal. normal erect    Strength:    Strength is V/V in the upper and lower limbs.      Sensation: intact to LT     Reflex Exam:  DTR's:    Deep tendon reflexes in the upper and lower extremities are normal bilaterally.   Toes:    The toes are downgoing bilaterally.   Clonus:    Clonus is absent.    Assessment/Plan:    No orders of the defined types were placed in this encounter.  No orders of the defined types were placed in this encounter.   Cc: Saguier, Iris Pert,  Saguier, Percell Miller, PA-C  Sarina Ill, [facility] [address] [place], [place] [address]  Phone [phone] Fax [phone]

## 2019-09-06 ENCOUNTER — Ambulatory Visit: Payer: Self-pay | Admitting: Neurology

## 2019-09-06 ENCOUNTER — Telehealth: Payer: Self-pay | Admitting: *Deleted

## 2019-09-06 NOTE — Telephone Encounter (Signed)
Patient no showed a new patient appt this morning. This is her first no show at [facility].

## 2019-09-07 ENCOUNTER — Encounter: Payer: Self-pay | Admitting: Neurology

## 2019-09-27 ENCOUNTER — Telehealth: Payer: Self-pay | Admitting: *Deleted

## 2019-09-27 NOTE — Telephone Encounter (Signed)
Patient has appt tomorrow with Mackie Pai.   Who Is Calling Patient / Member / Family / Caregiver Call Type Triage / Clinical Relationship To Patient Self Return Phone Number [phone] (Primary) Chief Complaint Insect Bite Reason for Call Symptomatic / Request for Health Information Initial Comment Caller states she has a spider bite under the right arm. Translation No Nurse Assessment Nurse: [name], RN, Rodena Piety Date/Time (Eastern Time): 09/26/2019 10:03:23 PM Confirm and document reason for call. If symptomatic, describe symptoms. ---Caller states she thinks she got bit by a spider under her right arm last night, it is red, swollen and tender.  Disp. Time Eilene Ghazi Time) Disposition Final User 09/26/2019 10:14:18 PM SEE PCP WITHIN 3 DAYS Yes Burns, RN, Rodena Piety

## 2019-09-28 ENCOUNTER — Other Ambulatory Visit: Payer: Self-pay

## 2019-09-28 ENCOUNTER — Telehealth (INDEPENDENT_AMBULATORY_CARE_PROVIDER_SITE_OTHER): Payer: Self-pay | Admitting: Medical

## 2019-09-28 DIAGNOSIS — M25511 Pain in right shoulder: Secondary | ICD-10-CM

## 2019-09-28 DIAGNOSIS — L299 Pruritus, unspecified: Secondary | ICD-10-CM

## 2019-09-28 DIAGNOSIS — L089 Local infection of the skin and subcutaneous tissue, unspecified: Secondary | ICD-10-CM

## 2019-09-28 MED ORDER — SULFAMETHOXAZOLE-TRIMETHOPRIM 800-160 MG PO TABS
1.0000 | ORAL_TABLET | Freq: Two times a day (BID) | ORAL | 0 refills | Status: DC
Start: 2019-09-28 — End: 2019-11-24

## 2019-09-28 NOTE — Progress Notes (Signed)
   Subjective:    Patient ID: Melanie Cruz, female    DOB: July 17, 1995, 24 y.o.   MRN: [id number]  HPI  Virtual Visit via Video Note  I connected with Melanie Cruz on 09/28/19 at 10:20 AM EDT by a video enabled telemedicine application and verified that I am speaking with the correct person using two identifiers.  Location: Patient: home Provider: office.   Participants- pt and myself.  No vitals taken by pt today.   No vitals seen.  I discussed the limitations of evaluation and management by telemedicine and the availability of in person appointments. The patient expressed understanding and agreed to proceed.  History of Present Illness:  Pt in states she has area under her rt lower portion of breast /abdomen junction. Pt though she felt possible spider on area. But she was not sure. She swiped possible spider off area on Saturday night. Pt state small white pump in area. Pt thinks pinkish red to area. Small white bump in middle. No dc. nontender when she touches. She states feel faintwarm. Slight swollen.  Pt last menses-2-3 weeks ago. Pt not breast feeding.  Also mentions rt shoulder pain. Pain started 2-3 weeks ago. Pt states at times simple movement like pulling buggy.   Observations/Objective: General-no acute distress, pleasant, oriented. Lungs- on inspection lungs appear unlabored. Neck- no tracheal deviation or jvd on inspection. Neuro- gross motor function appears intact. Derm- rt lower breast area. Small pinkish red area. No well visualized. Shadow cast. No vescicles.  Assessment and Plan: Skin infection in rt breast area by video exam. Rx bactrim ds and can do warm compresses twice daily.  This is a virtual visit and I do think is very important for you to follow-up in person next week so I can palpate the area and make sure no underlying to make sure not sure no underlying abnormality in the breast itself.  For itching to breast can use over-the-counter  Benadryl.  You mention at the end of the interview also right shoulder pain.  Would recommend low-dose ibuprofen 200 mg and can add Tylenol 325 mg if needed to help with shoulder pain.  When you in for follow-up next week we will get x-ray and evaluate shoulder on exam.  Follow-up in 1 week or as needed.  Follow Up Instructions:    I discussed the assessment and treatment plan with the patient. The patient was provided an opportunity to ask questions and all were answered. The patient agreed with the plan and demonstrated an understanding of the instructions.   The patient was advised to call back or seek an in-person evaluation if the symptoms worsen or if the condition fails to improve as anticipated.  I provided 35 minutes of non-face-to-face time during this encounter.   Mackie Pai, PA-C  Review of Systems     Objective:   Physical Exam        Assessment & Plan:

## 2019-09-28 NOTE — Patient Instructions (Addendum)
Skin infection in rt breast area by video exam. Rx bactrim ds and can do warm compresses twice daily.  This is a virtual visit and I do think is very important for you to follow-up in person next week so I can palpate the area and make sure no underlying to make sure not sure no underlying abnormality in the breast itself.  For itching to breast can use over-the-counter Benadryl.  You mention at the end of the interview also right shoulder pain.  Would recommend low-dose ibuprofen 200 mg and can add Tylenol 325 mg if needed to help with shoulder pain.  When you in for follow-up next week we will get x-ray and evaluate shoulder on exam.  Follow-up in 1 week or as needed.

## 2019-10-02 ENCOUNTER — Ambulatory Visit (HOSPITAL_BASED_OUTPATIENT_CLINIC_OR_DEPARTMENT_OTHER)
Admission: RE | Admit: 2019-10-02 | Discharge: 2019-10-02 | Disposition: A | Discharge status: Home / Self Care | Payer: Self-pay | Source: Ambulatory Visit | Attending: Medical | Admitting: Medical

## 2019-10-02 ENCOUNTER — Other Ambulatory Visit: Payer: Self-pay

## 2019-10-02 DIAGNOSIS — M25511 Pain in right shoulder: Secondary | ICD-10-CM | POA: Insufficient documentation

## 2019-10-04 ENCOUNTER — Ambulatory Visit: Payer: Self-pay | Admitting: Medical

## 2019-10-07 ENCOUNTER — Encounter: Payer: Self-pay | Admitting: Medical

## 2019-10-15 ENCOUNTER — Emergency Department (HOSPITAL_BASED_OUTPATIENT_CLINIC_OR_DEPARTMENT_OTHER)
Admission: EM | Admit: 2019-10-15 | Discharge: 2019-10-16 | Disposition: A | Discharge status: Home / Self Care | Payer: 59 | Attending: Emergency Medicine | Admitting: Emergency Medicine

## 2019-10-15 ENCOUNTER — Other Ambulatory Visit: Payer: Self-pay

## 2019-10-15 ENCOUNTER — Emergency Department (HOSPITAL_BASED_OUTPATIENT_CLINIC_OR_DEPARTMENT_OTHER): Payer: 59

## 2019-10-15 ENCOUNTER — Encounter (HOSPITAL_BASED_OUTPATIENT_CLINIC_OR_DEPARTMENT_OTHER): Payer: Self-pay | Admitting: *Deleted

## 2019-10-15 ENCOUNTER — Encounter: Payer: Self-pay | Admitting: Medical

## 2019-10-15 DIAGNOSIS — F17210 Nicotine dependence, cigarettes, uncomplicated: Secondary | ICD-10-CM | POA: Diagnosis not present

## 2019-10-15 DIAGNOSIS — R0781 Pleurodynia: Secondary | ICD-10-CM | POA: Insufficient documentation

## 2019-10-15 DIAGNOSIS — R079 Chest pain, unspecified: Secondary | ICD-10-CM | POA: Diagnosis present

## 2019-10-15 DIAGNOSIS — U099 Post covid-19 condition, unspecified: Secondary | ICD-10-CM | POA: Diagnosis not present

## 2019-10-15 HISTORY — DX: COVID-19: U07.1

## 2019-10-15 LAB — CBC WITH DIFFERENTIAL/PLATELET
Abs Immature Granulocytes: 0.03 10*3/uL (ref 0.00–0.07)
Basophils Absolute: 0.1 10*3/uL (ref 0.0–0.1)
Basophils Relative: 1 %
Eosinophils Absolute: 0.1 10*3/uL (ref 0.0–0.5)
Eosinophils Relative: 1 %
HCT: 38.9 % (ref 36.0–46.0)
Hemoglobin: 11.9 g/dL — ABNORMAL LOW (ref 12.0–15.0)
Immature Granulocytes: 0 %
Lymphocytes Relative: 39 %
Lymphs Abs: 3.4 10*3/uL (ref 0.7–4.0)
MCH: 25.4 pg — ABNORMAL LOW (ref 26.0–34.0)
MCHC: 30.6 g/dL (ref 30.0–36.0)
MCV: 83.1 fL (ref 80.0–100.0)
Monocytes Absolute: 0.6 10*3/uL (ref 0.1–1.0)
Monocytes Relative: 7 %
Neutro Abs: 4.5 10*3/uL (ref 1.7–7.7)
Neutrophils Relative %: 52 %
Platelets: 248 10*3/uL (ref 150–400)
RBC: 4.68 MIL/uL (ref 3.87–5.11)
RDW: 14.8 % (ref 11.5–15.5)
WBC: 8.6 10*3/uL (ref 4.0–10.5)
nRBC: 0 % (ref 0.0–0.2)

## 2019-10-15 LAB — COMPREHENSIVE METABOLIC PANEL
ALT: 39 U/L (ref 0–44)
AST: 22 U/L (ref 15–41)
Albumin: 4 g/dL (ref 3.5–5.0)
Alkaline Phosphatase: 40 U/L (ref 38–126)
Anion gap: 9 (ref 5–15)
BUN: 13 mg/dL (ref 6–20)
CO2: 25 mmol/L (ref 22–32)
Calcium: 8.9 mg/dL (ref 8.9–10.3)
Chloride: 104 mmol/L (ref 98–111)
Creatinine, Ser: 0.69 mg/dL (ref 0.44–1.00)
GFR, Estimated: >60 mL/min (ref >60)
Glucose, Bld: 89 mg/dL (ref 70–99)
Potassium: 3.6 mmol/L (ref 3.5–5.1)
Sodium: 138 mmol/L (ref 135–145)
Total Bilirubin: 0.2 mg/dL — ABNORMAL LOW (ref 0.3–1.2)
Total Protein: 7.5 g/dL (ref 6.5–8.1)

## 2019-10-15 LAB — TROPONIN I (HIGH SENSITIVITY): Troponin I (High Sensitivity): 3 ng/L (ref <18)

## 2019-10-15 LAB — D-DIMER, QUANTITATIVE: D-Dimer, Quant: 0.56 ug/mL-FEU — ABNORMAL HIGH (ref 0.00–0.50)

## 2019-10-15 NOTE — ED Triage Notes (Signed)
C/o left rib " chest" pain x 1 week , DX covid last week

## 2019-10-15 NOTE — ED Notes (Signed)
Presents with chest pain, points to area at left axilla, states she is now covid free, states had covid test 2 days ago and was negative. Describes pain at left axilla as sharp, pain began this afternoon, was at home sitting when pain began. Placed on cont cardiac monitoring with cont POX monitoring and int NBP assessments. Pt appears in no distress at this time.

## 2019-10-16 ENCOUNTER — Emergency Department (HOSPITAL_BASED_OUTPATIENT_CLINIC_OR_DEPARTMENT_OTHER): Payer: 59

## 2019-10-16 ENCOUNTER — Encounter (HOSPITAL_BASED_OUTPATIENT_CLINIC_OR_DEPARTMENT_OTHER): Payer: Self-pay

## 2019-10-16 LAB — TROPONIN I (HIGH SENSITIVITY): Troponin I (High Sensitivity): 2 ng/L (ref <18)

## 2019-10-16 MED ORDER — IOHEXOL 350 MG/ML SOLN
100.0000 mL | Freq: Once | INTRAVENOUS | Status: AC | PRN
  Administered 2019-10-16: 100 mL via INTRAVENOUS

## 2019-10-16 MED ORDER — DICLOFENAC SODIUM 1 % EX GEL: 2.0000 g | Freq: Four times a day (QID) | CUTANEOUS | 0 refills | Status: DC | PRN

## 2019-10-16 MED ORDER — IOHEXOL 300 MG/ML  SOLN: 100.0000 mL | Freq: Once | INTRAMUSCULAR | Status: DC | PRN

## 2019-10-16 NOTE — ED Notes (Signed)
ED-MD informed of D-dimer, CT scan ordered, EDP in to speak with pt re: current plan of care.

## 2019-10-16 NOTE — Discharge Instructions (Signed)
You were seen in the emergency room today with chest pain. Your lab work is reassuring. I performed a CT scan which showed no significant findings such as pneumonia, blood clots in the lungs. Please follow close with your primary care doctor to review your imaging from today along with lab work and guide any further testing you may be necessary. Return to the emergency department any new or suddenly worsening symptoms.

## 2019-10-16 NOTE — ED Provider Notes (Signed)
Emergency Department Provider Note   I have reviewed the triage vital signs and the nursing notes.   HISTORY  Chief Complaint Chest Pain (covid +)   HPI Melanie Cruz is a 24 y.o. female with recent COVID-19 infection now recovered presents with left lateral chest discomfort.  Symptoms have been present for the past 2 days.  She has intermittent sharp pain in the left lateral chest with no clear provoking or modifying factors.  Pain does not radiate.  She is having more of a pressure sensation yesterday but now the pain is intermittent and more sharp.  She is not having fevers or shaking chills.  No vomiting.  No abdominal discomfort.  No similar symptoms in the past.  No leg pain or swelling.  No prior history of DVT/PE.    Past Medical History:  Diagnosis Date  . COVID-19   . Migraine     Patient Active Problem List   Diagnosis Date Noted  . 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 Tobacco abuse counseling 03/13/2017    Past Surgical History:  Procedure Laterality Date  . NO PAST SURGERIES      Allergies Patient has no known allergies.  Family History  Problem Relation Age of Onset  . Hypertension Mother   . Breast cancer Paternal Aunt        age at onset 34's    Social History Social History   Tobacco Use  . Smoking status: Current Every Day Smoker    Packs/day: 0.50    Years: 5.00    Pack years: 2.50    Types: Cigarettes  . Smokeless tobacco: Never Used  Substance Use Topics  . Alcohol use: Yes  . Drug use: Not Currently    Types: Marijuana    Comment: occasional-last use 05/23/2017    Review of Systems  Constitutional: No fever/chills Eyes: No visual changes. ENT: No sore throat. Cardiovascular: Positive left lateral chest pain.  Respiratory: Denies shortness of breath. Gastrointestinal: No abdominal pain.  No nausea, no vomiting.  No diarrhea.  No constipation. Genitourinary: Negative for dysuria. Musculoskeletal: Negative for back  pain. Skin: Negative for rash. Neurological: Negative for headaches, focal weakness or numbness.  10-point ROS otherwise negative.  ____________________________________________   PHYSICAL EXAM:  VITAL SIGNS: ED Triage Vitals  Enc Vitals Group     BP 10/15/19 2134 133/86     Pulse Rate 10/15/19 2134 91     Resp 10/15/19 2134 18     Temp 10/15/19 2134 98.4 F (36.9 C)     Temp src --      SpO2 10/15/19 2134 100 %     Weight 10/15/19 2131 230 lb (104.3 kg)     Height 10/15/19 2131 5\' 4"  (1.626 m)   Constitutional: Alert and oriented. Well appearing and in no acute distress. Eyes: Conjunctivae are normal.  Head: Atraumatic. Nose: No congestion/rhinnorhea. Mouth/Throat: Mucous membranes are moist. Neck: No stridor.   Cardiovascular: Normal rate, regular rhythm. Good peripheral circulation. Grossly normal heart sounds.   Respiratory: Normal respiratory effort.  No retractions. Lungs CTAB. Gastrointestinal: Soft and nontender. No distention.  Musculoskeletal: No lower extremity tenderness nor edema. No gross deformities of extremities. No left lateral chest wall tenderness. No crepitus.  Neurologic:  Normal speech and language.  Skin:  Skin is warm, dry and intact. No rash noted.  ____________________________________________   LABS (all labs ordered are listed, but only abnormal results are displayed)  Labs Reviewed  CBC WITH DIFFERENTIAL/PLATELET - Abnormal; Notable  for the following components:      Result Value   Hemoglobin 11.9 (*)    MCH 25.4 (*)    All other components within normal limits  COMPREHENSIVE METABOLIC PANEL - Abnormal; Notable for the following components:   Total Bilirubin 0.2 (*)    All other components within normal limits  D-DIMER, QUANTITATIVE (NOT AT [facility]) - Abnormal; Notable for the following components:   D-Dimer, Quant 0.56 (*)    All other components within normal limits  TROPONIN I (HIGH SENSITIVITY)  TROPONIN I (HIGH SENSITIVITY)    ____________________________________________  EKG   EKG Interpretation  Date/Time:  Friday October 15 2019 23:24:20 EDT Ventricular Rate:  76 PR Interval:    QRS Duration: 84 QT Interval:  399 QTC Calculation: 449 R Axis:   78 Text Interpretation: Sinus arrhythmia No STEMI Confirmed by Nanda Quinton [phone]) on 10/16/2019 1:10:48 AM       ____________________________________________  RADIOLOGY  CT Angio Chest PE W and/or Wo Contrast  Result Date: 10/16/2019 CLINICAL DATA:  Left rib pain and chest pain x1 week. Recent COVID diagnosis. EXAM: CT ANGIOGRAPHY CHEST WITH CONTRAST TECHNIQUE: Multidetector CT imaging of the chest was performed using the standard protocol during bolus administration of intravenous contrast. Multiplanar CT image reconstructions and MIPs were obtained to evaluate the vascular anatomy. CONTRAST:  154mL OMNIPAQUE IOHEXOL 350 MG/ML SOLN COMPARISON:  None. FINDINGS: Cardiovascular: Contrast injection is sufficient to demonstrate satisfactory opacification of the pulmonary arteries to the segmental level. There is no pulmonary embolus or evidence of right heart strain. The size of the main pulmonary artery is normal. Mild cardiomegaly. The course and caliber of the aorta are normal. There is no atherosclerotic calcification. Opacification decreased due to pulmonary arterial phase contrast bolus timing. Mediastinum/Nodes: -- No mediastinal lymphadenopathy. -- No hilar lymphadenopathy. -- No axillary lymphadenopathy. -- No supraclavicular lymphadenopathy. --there is thyromegaly. -  Unremarkable esophagus. Lungs/Pleura: Airways are patent. No pleural effusion, lobar consolidation, pneumothorax or pulmonary infarction. Upper Abdomen: Contrast bolus timing is not optimized for evaluation of the abdominal organs. The visualized portions of the organs of the upper abdomen are normal. Musculoskeletal: No chest wall abnormality. No bony spinal canal stenosis. Review of the MIP  images confirms the above findings. IMPRESSION: 1. No evidence of pulmonary embolism or other acute intrathoracic process. 2. Mild cardiomegaly. 3. Thyromegaly. Electronically Signed   By: Constance Holster M.D.   On: 10/16/2019 01:39   DG Chest Portable 1 View  Result Date: 10/15/2019 CLINICAL DATA:  Chest pain EXAM: PORTABLE CHEST 1 VIEW COMPARISON:  05/24/2019 FINDINGS: The heart size and mediastinal contours are within normal limits. Both lungs are clear. The visualized skeletal structures are unremarkable. IMPRESSION: No active disease. Electronically Signed   By: Inez Catalina M.D.   On: 10/15/2019 22:12    ____________________________________________   PROCEDURES  Procedure(s) performed:   Procedures  None  ____________________________________________   INITIAL IMPRESSION / ASSESSMENT AND PLAN / ED COURSE  Pertinent labs & imaging results that were available during my care of the patient were reviewed by me and considered in my medical decision making (see chart for details).   Patient presents to the emergency department with left lateral chest pain which is intermittent and sharp.  Unable to reproduce pain on exam.  Patient did recently recover from COVID-19.  Chest x-ray is clear lab work is largely unremarkable.  I did add on a D-dimer given her increased risk of blood clot with recent Covid infection.  This did  come back slightly elevated and CTA was ordered and results are pending.  01:50 AM  CTA of the chest reviewed with no acute findings. Patient's repeat troponin is negative. Suspect MSK etiology for chest pain versus post viral pleurodynia. Plan for over-the-counter Tylenol and/or ibuprofen. Also provided prescription for Voltaren gel. Encouraged PCP follow-up and discussed ED return precautions. ____________________________________________  FINAL CLINICAL IMPRESSION(S) / ED DIAGNOSES  Final diagnoses:  Pleurodynia     MEDICATIONS GIVEN DURING THIS  VISIT:  Medications  iohexol (OMNIPAQUE) 300 MG/ML solution 100 mL (has no administration in time range)  iohexol (OMNIPAQUE) 350 MG/ML injection 100 mL (100 mLs Intravenous Contrast Given 10/16/19 0104)     NEW OUTPATIENT MEDICATIONS STARTED DURING THIS VISIT:  New Prescriptions   DICLOFENAC SODIUM (VOLTAREN) 1 % GEL    Apply 2 g topically 4 (four) times daily as needed.    Note:  This document was prepared using Dragon voice recognition software and may include unintentional dictation errors.  Nanda Quinton, MD, [facility] Emergency Medicine    Patricio Popwell, [facility], MD 10/16/19 [phone]

## 2019-10-16 NOTE — ED Notes (Signed)
Patient transported to CT 

## 2019-10-16 NOTE — ED Notes (Signed)
Pt expressed concern for amount of time they have been here. PT states " I am hungry and I don't know why It's taking so long" explained to pt CT scan is aware of MD order and Lab tests are pending". Pt given warm blanket and tv remote.

## 2019-10-19 ENCOUNTER — Ambulatory Visit: Payer: Self-pay | Admitting: Medical

## 2019-11-24 ENCOUNTER — Ambulatory Visit (INDEPENDENT_AMBULATORY_CARE_PROVIDER_SITE_OTHER): Payer: 59 | Admitting: Family Medicine

## 2019-11-24 ENCOUNTER — Other Ambulatory Visit: Payer: Self-pay

## 2019-11-24 ENCOUNTER — Other Ambulatory Visit (HOSPITAL_COMMUNITY)
Admission: RE | Admit: 2019-11-24 | Discharge: 2019-11-24 | Disposition: A | Discharge status: Home / Self Care | Payer: 59 | Source: Ambulatory Visit | Attending: Family Medicine | Admitting: Family Medicine

## 2019-11-24 ENCOUNTER — Encounter: Payer: Self-pay | Admitting: Family Medicine

## 2019-11-24 ENCOUNTER — Other Ambulatory Visit: Payer: Self-pay | Admitting: Family Medicine

## 2019-11-24 VITALS — BP 96/61 | HR 90 | Ht 64.0 in | Wt 231.0 lb

## 2019-11-24 DIAGNOSIS — Z113 Encounter for screening for infections with a predominantly sexual mode of transmission: Secondary | ICD-10-CM | POA: Diagnosis not present

## 2019-11-24 DIAGNOSIS — Z01419 Encounter for gynecological examination (general) (routine) without abnormal findings: Secondary | ICD-10-CM | POA: Diagnosis present

## 2019-11-24 DIAGNOSIS — L02214 Cutaneous abscess of groin: Secondary | ICD-10-CM | POA: Diagnosis not present

## 2019-11-24 MED ORDER — ETONOGESTREL-ETHINYL ESTRADIOL 0.12-0.015 MG/24HR VA RING: VAGINAL_RING | VAGINAL | 3 refills | Status: DC

## 2019-11-24 MED ORDER — FLUCONAZOLE 150 MG PO TABS: 150.0000 mg | ORAL_TABLET | Freq: Once | ORAL | 0 refills | Status: DC

## 2019-11-24 MED ORDER — SULFAMETHOXAZOLE-TRIMETHOPRIM 800-160 MG PO TABS
1.0000 | ORAL_TABLET | Freq: Two times a day (BID) | ORAL | 0 refills | Status: DC
Start: 2019-11-24 — End: 2019-11-24

## 2019-11-24 MED ORDER — SULFAMETHOXAZOLE-TRIMETHOPRIM 800-160 MG PO TABS
1.0000 | ORAL_TABLET | Freq: Two times a day (BID) | ORAL | 0 refills | Status: DC
Start: 2019-11-24 — End: 2019-11-25

## 2019-11-24 MED FILL — SULFAMETHOXAZOLE-TMP DS TAB: 800-160 | 7 days supply | Qty: 14 | Fill #0

## 2019-11-24 MED FILL — FLUCONAZOLE 150 MG TABS: 150 | 1 days supply | Qty: 1 | Fill #0

## 2019-11-24 MED FILL — NUVARING VAGINAL RING: 0.12-0.015 | 84 days supply | Qty: 3 | Fill #0

## 2019-11-24 NOTE — Addendum Note (Signed)
Addended by: Truett Mainland on: 11/24/2019 03:48 PM   Modules accepted: Orders

## 2019-11-24 NOTE — Progress Notes (Addendum)
GYNECOLOGY ANNUAL PREVENTATIVE CARE ENCOUNTER NOTE  Subjective:   Melanie Cruz is a 24 y.o. G3P1011 female here for a routine annual gynecologic exam.  Current complaints: none.   Denies abnormal vaginal bleeding, discharge, pelvic pain, problems with intercourse or other gynecologic concerns.    Gynecologic History Patient's last menstrual period was 11/13/2019. Patient is  sexually active  Contraception: none Last Pap: 2019. Results were: normal Last mammogram: n/a.  Obstetric History OB History  Gravida Para Term Preterm AB Living  2 1 1   1 1   SAB TAB Ectopic Multiple Live Births        0 1    # Outcome Date GA Lbr Len/2nd Weight Sex Delivery Anes PTL Lv  2 Term 09/09/17 [redacted]w[redacted]d / 00:46 7 lb 11.8 oz (3.51 kg) M Vag-Spont EPI  LIV  1 AB             Past Medical History:  Diagnosis Date  . COVID-19   . Migraine     Past Surgical History:  Procedure Laterality Date  . NO PAST SURGERIES      Current Outpatient Medications on File Prior to Visit  Medication Sig Dispense Refill  . buPROPion (WELLBUTRIN XL) 150 MG 24 hr tablet Take 1 tablet (150 mg total) by mouth daily. (Patient not taking: Reported on 11/24/2019) 30 tablet 0  . diclofenac Sodium (VOLTAREN) 1 % GEL Apply 2 g topically 4 (four) times daily as needed. (Patient not taking: Reported on 11/24/2019) 50 g 0  . fluticasone (FLONASE) 50 MCG/ACT nasal spray Place 2 sprays into both nostrils daily. (Patient not taking: Reported on 11/24/2019) 16 g 1  . naproxen (NAPROSYN) 500 MG tablet Take 1 tablet (500 mg total) by mouth 2 (two) times daily. (Patient not taking: Reported on 11/24/2019) 30 tablet 0  . neomycin-polymyxin-hydrocortisone (CORTISPORIN) OTIC solution Place 3 drops into the left ear 4 (four) times daily. (Patient not taking: Reported on 11/24/2019) 10 mL 0  . nicotine (NICODERM CQ - DOSED IN MG/24 HOURS) 21 mg/24hr patch Place 1 patch (21 mg total) onto the skin daily. (Patient not taking: Reported on  11/24/2019) 28 patch 0  . sulfamethoxazole-trimethoprim (BACTRIM DS) 800-160 MG tablet Take 1 tablet by mouth 2 (two) times daily. (Patient not taking: Reported on 11/24/2019) 14 tablet 0  . SUMAtriptan (IMITREX) 50 MG tablet 1 tab po onset of ha. Repeat in 2 hours if needed. (Patient not taking: Reported on 11/24/2019) 10 tablet 0  . valACYclovir (VALTREX) 1000 MG tablet Take 1 tablet (1,000 mg total) by mouth 2 (two) times daily. (Patient not taking: Reported on 11/24/2019) 20 tablet 0   No current facility-administered medications on file prior to visit.    No Known Allergies  Social History   Socioeconomic History  . Marital status: Single    Spouse name: Not on file  . Number of children: Not on file  . Years of education: Not on file  . Highest education level: Not on file  Occupational History  . Not on file  Tobacco Use  . Smoking status: Current Every Day Smoker    Packs/day: 0.50    Years: 5.00    Pack years: 2.50    Types: Cigarettes  . Smokeless tobacco: Never Used  Substance and Sexual Activity  . Alcohol use: Yes  . Drug use: Not Currently    Types: Marijuana    Comment: occasional-last use 05/23/2017  . Sexual activity: Yes    Birth control/protection: None  Other Topics Concern  . Not on file  Social History Narrative  . Not on file   Social Determinants of Health   Financial Resource Strain:   . Difficulty of Paying Living Expenses: Not on file  Food Insecurity:   . Worried About Charity fundraiser in the Last Year: Not on file  . Ran Out of Food in the Last Year: Not on file  Transportation Needs:   . Lack of Transportation (Medical): Not on file  . Lack of Transportation (Non-Medical): Not on file  Physical Activity:   . Days of Exercise per Week: Not on file  . Minutes of Exercise per Session: Not on file  Stress:   . Feeling of Stress : Not on file  Social Connections:   . Frequency of Communication with Friends and Family: Not on file  .  Frequency of Social Gatherings with Friends and Family: Not on file  . Attends Religious Services: Not on file  . Active Member of Clubs or Organizations: Not on file  . Attends Archivist Meetings: Not on file  . Marital Status: Not on file  Intimate Partner Violence:   . Fear of Current or Ex-Partner: Not on file  . Emotionally Abused: Not on file  . Physically Abused: Not on file  . Sexually Abused: Not on file    Family History  Problem Relation Age of Onset  . Hypertension Mother   . Breast cancer Paternal Aunt        age at onset 7's    The following portions of the patient's history were reviewed and updated as appropriate: allergies, current medications, past family history, past medical history, past social history, past surgical history and problem list.  Review of Systems Pertinent items are noted in HPI.   Objective:  BP 96/61   Pulse 90   Ht 5\' 4"  (1.626 m)   Wt 231 lb (104.8 kg)   LMP 11/13/2019   BMI 39.65 kg/m  Wt Readings from Last 3 Encounters:  11/24/19 231 lb (104.8 kg)  10/15/19 230 lb (104.3 kg)  06/30/19 228 lb (103.4 kg)     Chaperone present during exam  CONSTITUTIONAL: Well-developed, well-nourished female in no acute distress.  HENT:  Normocephalic, atraumatic, External right and left ear normal. Oropharynx is clear and moist EYES: Conjunctivae and EOM are normal. Pupils are equal, round, and reactive to light. No scleral icterus.  NECK: Normal range of motion, supple, no masses.  Normal thyroid.   CARDIOVASCULAR: Normal heart rate noted, regular rhythm RESPIRATORY: Clear to auscultation bilaterally. Effort and breath sounds normal, no problems with respiration noted. BREASTS: Symmetric in size. No masses, skin changes, nipple drainage, or lymphadenopathy. ABDOMEN: Soft, normal bowel sounds, no distention noted.  No tenderness, rebound or guarding.  PELVIC: Normal appearing external genitalia; normal appearing vaginal mucosa and  cervix.  No abnormal discharge noted.  1cm indurated in left inguinal crease. MUSCULOSKELETAL: Normal range of motion. No tenderness.  No cyanosis, clubbing, or edema.  2+ distal pulses. SKIN: Skin is warm and dry. No rash noted. Not diaphoretic. No erythema. No pallor. NEUROLOGIC: Alert and oriented to person, place, and time. Normal reflexes, muscle tone coordination. No cranial nerve deficit noted. PSYCHIATRIC: Normal mood and affect. Normal behavior. Normal judgment and thought content.  Assessment:  Annual gynecologic examination with pap smear   Plan:  1. Well Woman Exam Will follow up results of pap smear and manage accordingly. Mammogram scheduled STD testing discussed. Patient requested  testing Discussed contraception - patient would like to use nuvaring. Discussed how to use, potential side effects.  - HIV antibody (with reflex) - Hepatitis C Antibody - Hepatitis B Surface AntiGEN - RPR - Cytology - PAP( [facility] [place])  2. Abscess Bactrim prescribed.  Routine preventative health maintenance measures emphasized. Please refer to After Visit Summary for other counseling recommendations.    [place], [place] for [employer]

## 2019-11-24 NOTE — Addendum Note (Signed)
Addended by: Truett Mainland on: 11/24/2019 03:50 PM   Modules accepted: Orders

## 2019-11-25 ENCOUNTER — Other Ambulatory Visit: Payer: Self-pay

## 2019-11-25 LAB — RPR: RPR Ser Ql: NONREACTIVE

## 2019-11-25 LAB — HEPATITIS C ANTIBODY: Hep C Virus Ab: <0.1 s/co ratio (ref 0.0–0.9)

## 2019-11-25 LAB — HEPATITIS B SURFACE ANTIGEN: Hepatitis B Surface Ag: NEGATIVE

## 2019-11-25 LAB — HIV ANTIBODY (ROUTINE TESTING W REFLEX): HIV Screen 4th Generation wRfx: NONREACTIVE

## 2019-11-25 MED ORDER — ETONOGESTREL-ETHINYL ESTRADIOL 0.12-0.015 MG/24HR VA RING: VAGINAL_RING | VAGINAL | 3 refills | Status: DC

## 2019-11-25 MED ORDER — SULFAMETHOXAZOLE-TRIMETHOPRIM 800-160 MG PO TABS: 1.0000 | ORAL_TABLET | Freq: Two times a day (BID) | ORAL | 0 refills | Status: DC

## 2019-11-25 NOTE — Progress Notes (Signed)
Reordered to new pharmacy per patient request. Kathrene Alu RN

## 2019-11-26 LAB — CYTOLOGY - PAP
Chlamydia: NEGATIVE
Comment: NEGATIVE
Comment: NORMAL
Diagnosis: NEGATIVE
Neisseria Gonorrhea: NEGATIVE

## 2019-12-07 ENCOUNTER — Telehealth: Payer: Self-pay | Admitting: *Deleted

## 2019-12-07 ENCOUNTER — Encounter: Payer: Self-pay | Admitting: Medical

## 2019-12-07 NOTE — Telephone Encounter (Signed)
Pt called and appt  made 

## 2019-12-07 NOTE — Telephone Encounter (Signed)
Home care was given, but we should follow up on patient status.

## 2019-12-07 NOTE — Telephone Encounter (Signed)
Pt called stated she is doing better declined visit for today, will call and make an appt if pain persists

## 2019-12-07 NOTE — Telephone Encounter (Signed)
Who Is Calling Patient / Member / Family / Caregiver Call Type Triage / Clinical Relationship To Patient Self Return Phone Number [phone] (Primary) Chief Complaint Abdominal Pain Reason for Call Symptomatic / Request for Health Information Initial Comment Caller states she is having some upper stomach pain and wants to speak to a nurse about it. Translation No Nurse Assessment Nurse: Nehemiah Massed, RN, Tammy Date/Time (Eastern Time): 12/06/2019 11:41:56 PM Confirm and document reason for call. If symptomatic, describe symptoms. ---Caller states she is having some upper stomach pain, sore if touched. Pain started yesterday. She wants to speak to a nurse about it Does the patient have any new or worsening symptoms? ---Yes Will a triage be completed? ---Yes Related visit to physician within the last 2 weeks? ---No Does the PT have any chronic conditions? (i.e. diabetes, asthma, this includes High risk factors for pregnancy, etc.) ---No Is the patient pregnant or possibly pregnant? (Ask all females between the ages of 83-55) ---No Is this a behavioral health or substance abuse call? ---No Guidelines Guideline Title Affirmed Question Affirmed Notes Nurse Date/Time Eilene Ghazi Time) Abdominal Pain - Upper Abdominal pain [place], RN, Tammy 12/06/2019 11:44:35 PM Disp. Time Eilene Ghazi Time) Disposition Final User 12/06/2019 11:51:03 [place], RN, Tammy  Care Advice Given Per Guideline HOME CARE: * You should be able to treat this at home. ANTACID: * If having pain now, try taking an antacid (e.g., Mylanta, Maalox). * It doesn't sound like a serious stomachache. DRINK CLEAR FLUIDS: * Drink clear fluids only (e.g., water, flat soft drinks or half-strength Gatorade). * Acetaminophen (e.g., Tylenol) does not cause stomach irritation. AVOID ASPIRIN AND NSAIDS: AVOID ALCOHOL: * Alcohol is a stomach irritant. STOP SMOKING: * Stop or reduce smoking. CALL BACK IF: * Severe pain present  over 1 hour * Constant pain present over 2 hours * Intermittent pain lasts over 48 hours * You become worse CARE ADVICE given per Abdominal Pain, Upper (Adult) guideline

## 2019-12-08 ENCOUNTER — Encounter: Payer: Self-pay | Admitting: Family Medicine

## 2019-12-08 ENCOUNTER — Ambulatory Visit: Payer: 59 | Admitting: Family Medicine

## 2019-12-08 ENCOUNTER — Other Ambulatory Visit: Payer: Self-pay

## 2019-12-08 ENCOUNTER — Other Ambulatory Visit: Payer: Self-pay | Admitting: Family Medicine

## 2019-12-08 ENCOUNTER — Ambulatory Visit (INDEPENDENT_AMBULATORY_CARE_PROVIDER_SITE_OTHER): Payer: 59 | Admitting: Family Medicine

## 2019-12-08 VITALS — BP 118/68 | HR 107 | Temp 98.6°F | Ht 65.0 in | Wt 230.5 lb

## 2019-12-08 DIAGNOSIS — K2900 Acute gastritis without bleeding: Secondary | ICD-10-CM

## 2019-12-08 MED ORDER — PANTOPRAZOLE SODIUM 40 MG PO TBEC: 40.0000 mg | DELAYED_RELEASE_TABLET | Freq: Every day | ORAL | 1 refills | Status: DC

## 2019-12-08 MED FILL — PANTOPRAZOLE SOD DR 40 MG T: 40 | 30 days supply | Qty: 30 | Fill #0

## 2019-12-08 NOTE — Progress Notes (Signed)
Chief Complaint  Patient presents with  . Back Pain  . Abdominal Pain    Subjective: Patient is a 24 y.o. female here for abd/back pain.  Sunday, 4 d ago, started having epigastric abd pain described as a 3/10 pain that is irritating.  She went on drinking the night before.  Nothing makes it better or worse other than Tums did help. No N/V/D, bleeding. R upper back pain started the next day, but it is no longer bothering her. Denies fevers, skin changes, burning, waterbrash, sore throat.  She does have a history of reflux.  Past Medical History:  Diagnosis Date  . COVID-19   . Migraine     Objective: BP 118/68 (BP Location: Left Arm, Patient Position: Sitting, Cuff Size: Normal)   Pulse (!) 107   Temp 98.6 F (37 C) (Oral)   Ht 5\' 5"  (1.651 m)   Wt 230 lb 8 oz (104.6 kg)   LMP 11/13/2019   SpO2 99%   BMI 38.36 kg/m  General: Awake, appears stated age HEENT: MMM Abdomen: Bowel sounds present, soft, tender to palpation of the epigastric region, negative Rovsing's, McBurney's, Murphy's, Carnett's Heart: RRR Lungs: CTAB, no rales, wheezes or rhonchi. No accessory muscle use Psych: Age appropriate judgment and insight, normal affect and mood  Assessment and Plan: Acute gastritis without hemorrhage, unspecified gastritis type - Plan: pantoprazole (PROTONIX) 40 MG tablet  Protonix 40 mg daily for the next 2 to 4 weeks.  Could consider continuing for 60 days.  I think the alcohol causing irritation to her stomach and decreasing her acid production will be helpful in the healing process.  Avoid aggravating foods/triggers.  Follow-up as needed for this issue with her regular PCP. The patient voiced understanding and agreement to the plan.  [place], DO 12/08/19  2:55 PM

## 2019-12-08 NOTE — Patient Instructions (Signed)
The only lifestyle changes that have data behind them are weight loss for the overweight/obese and elevating the head of the bed. Finding out which foods/positions are triggers is important.  Let me know if there are cost issues.   Let us know if you need anything.

## 2019-12-09 ENCOUNTER — Ambulatory Visit: Payer: 59 | Admitting: Medical

## 2019-12-20 ENCOUNTER — Encounter: Payer: Self-pay | Admitting: Medical

## 2019-12-20 ENCOUNTER — Telehealth: Payer: Self-pay | Admitting: Medical

## 2019-12-20 NOTE — Telephone Encounter (Signed)
I advised pt to come in for her ha. Schedule today or tomorrow.

## 2019-12-20 NOTE — Telephone Encounter (Signed)
Appt made for tomorrow.

## 2019-12-21 ENCOUNTER — Telehealth: Payer: 59 | Admitting: Medical

## 2019-12-21 ENCOUNTER — Other Ambulatory Visit: Payer: Self-pay

## 2019-12-22 ENCOUNTER — Other Ambulatory Visit: Payer: Self-pay

## 2019-12-22 ENCOUNTER — Telehealth (INDEPENDENT_AMBULATORY_CARE_PROVIDER_SITE_OTHER): Payer: 59 | Admitting: Medical

## 2019-12-22 DIAGNOSIS — R739 Hyperglycemia, unspecified: Secondary | ICD-10-CM | POA: Diagnosis not present

## 2019-12-22 DIAGNOSIS — R0683 Snoring: Secondary | ICD-10-CM | POA: Diagnosis not present

## 2019-12-22 DIAGNOSIS — R682 Dry mouth, unspecified: Secondary | ICD-10-CM | POA: Diagnosis not present

## 2019-12-22 DIAGNOSIS — G43809 Other migraine, not intractable, without status migrainosus: Secondary | ICD-10-CM

## 2019-12-22 MED ORDER — DICLOFENAC SODIUM 75 MG PO TBEC: DELAYED_RELEASE_TABLET | ORAL | 0 refills | Status: DC

## 2019-12-22 NOTE — Progress Notes (Addendum)
Subjective:    Patient ID: Melanie Cruz, female    DOB: 11/14/1995, 24 y.o.   MRN: [id number]  HPI  Virtual Visit via Video Note  I connected with Melanie Cruz on 12/22/19 at 11:00 AM EST by a video enabled telemedicine application and verified that I am speaking with the correct person using two identifiers.  Location: Patient: home. Provider: office  Participants: patient and Mackie Pai PA-C   I discussed the limitations of evaluation and management by telemedicine and the availability of in person appointments. The patient expressed understanding and agreed to proceed.  History of Present Illness: Pt in for follow up for ha. Pt never saw Dr. Jaynee Eagles neurologist but did place referral. Pt states headache on Saturday. She did not sleep well night before and drank some alcohol the night before Drank half bottle of vodka.  LMP-started yesterday.   Pt has migraine ha hx. Noise sensitive past headache on Saturday. Lasted for 1 hour or two.  Pt states takes tylenol extra strength for ha(most recently did stop her ha). Pt in past in summer tried imitrex and states did not help.   Pt states mostly drinks on weekends. Sometimes heavy.  She is not having frequent headaches recently but was more frequent in the summer at time referred to Dr. Jaynee Eagles.Pt states no neck pain associated with ha. No nausea, no vomiting.  Pt states she has dry mouth when she sleeps. Some nasal congestion. Pt tries not to sleep with heat on in her room. Pt using over the counter saline spray with aloe vera. Pt has hx of high sugar.  Pt does snore on occasion. Sometimes having ha in morning.         Observations/Objective: General-no acute distress, pleasant, oriented. Lungs- on inspection lungs appear unlabored. Neck- no tracheal deviation or jvd on inspection. Neuro- gross motor function appears intact.  Assessment and Plan: You do have some ha that are at times migraine like but most recently and  back in the summer associated with moderate alcohol use. Please cut back on alcohol use. Very important as want you to avoid hangover/ dehydration HA. Can use extra strength tylenol with diclofenac if needed for ha. Let me know if that stops ha. If not then might try med called relpax though in past you report imitrex did not help. Will go ahead and refer you to neurologist for evaluation and treatment.  For dry mouth and elevated sugar placing future cmp and a1c.   Want you to  talk with your friend and make sure we get update as to how often you snore. If frequent then will go ahead and refer to pulmonologist since  sleep apnea may be factor in your HA  Please send me bp update by my chart.  Follow up 10-14 days or as needed  Follow Up Instructions:    I discussed the assessment and treatment plan with the patient. The patient was provided an opportunity to ask questions and all were answered. The patient agreed with the plan and demonstrated an understanding of the instructions.   The patient was advised to call back or seek an in-person evaluation if the symptoms worsen or if the condition fails to improve as anticipated.  Time spent with patient today was 30  minutes which consisted of chart revdiew, discussing diagnosis, work up treatment and documentation.   Mackie Pai, PA-C    Review of Systems     Objective:   Physical Exam  Assessment & Plan:

## 2019-12-22 NOTE — Patient Instructions (Signed)
You do have some ha that are at times migraine like but most recently and back in the summer associated with moderate alcohol use. Please cut back on alcohol use. Very important as want you to avoid hangover/ dehydration HA. Can use extra strength tylenol with diclofenac if needed for ha. Let me know if that stops ha. If not then might try med called relpax though in past you report imitrex did not help. Will go ahead and refer you to neurologist for evaluation and treatment.  For dry mouth and elevated sugar placing future cmp and a1c.   Want you to  talk with your friend and make sure we get update as to how often you snore. If frequent then will go ahead and refer to pulmonologist since  sleep apnea may be factor in your HA  Please send me bp update by my chart.  Follow up 10-14 days or as needed

## 2019-12-28 ENCOUNTER — Encounter: Payer: Self-pay | Admitting: Medical

## 2019-12-28 NOTE — Addendum Note (Signed)
Addended by: Anabel Halon on: 12/28/2019 04:12 PM   Modules accepted: Orders

## 2019-12-29 ENCOUNTER — Telehealth (INDEPENDENT_AMBULATORY_CARE_PROVIDER_SITE_OTHER): Payer: 59 | Admitting: Medical

## 2019-12-29 ENCOUNTER — Other Ambulatory Visit: Payer: Self-pay

## 2019-12-29 DIAGNOSIS — R739 Hyperglycemia, unspecified: Secondary | ICD-10-CM | POA: Diagnosis not present

## 2019-12-29 DIAGNOSIS — G43809 Other migraine, not intractable, without status migrainosus: Secondary | ICD-10-CM

## 2019-12-29 DIAGNOSIS — F419 Anxiety disorder, unspecified: Secondary | ICD-10-CM | POA: Diagnosis not present

## 2019-12-29 DIAGNOSIS — F17200 Nicotine dependence, unspecified, uncomplicated: Secondary | ICD-10-CM | POA: Diagnosis not present

## 2019-12-29 MED ORDER — BUSPIRONE HCL 7.5 MG PO TABS: 7.5000 mg | ORAL_TABLET | Freq: Two times a day (BID) | ORAL | 0 refills | Status: DC

## 2019-12-29 MED ORDER — BUPROPION HCL ER (XL) 150 MG PO TB24: 150.0000 mg | ORAL_TABLET | Freq: Every day | ORAL | 0 refills | Status: DC

## 2019-12-29 NOTE — Patient Instructions (Signed)
Patient has recent daily anxiety with panic attack yesterday. Decided to go ahead and prescribe low-dose BuSpar. She will start with 7.5 mg dose and might need to increase to 15mg  depending on how she does. Advised try to get some daily exercise and eat healthy. Try conservative measures to reduce stress as well.  For history of smoking did refill patient's Wellbutrin. She used it in the past briefly but was not consistent. Try to taper off smoking gradually as discussed.  History of intermittent low level headaches recently. Overall controlled compared to previously in the summer. Patient does have diclofenac available to use if Tylenol does not help her occasional headache. I did remind her not to drink any alcohol as on the last visit headaches seem to be associated with alcohol use. Counseled again that dehydration could lead to headache or set off migraine type headache potential.  History of mild elevated sugar. Did advise patient she can go ahead and get a metabolic panel and T5V. Those future labs already placed.  Follow-up in 2 to 3 weeks or as needed.

## 2019-12-29 NOTE — Progress Notes (Signed)
Subjective:    Patient ID: Melanie Cruz, female    DOB: November 11, 1995, 24 y.o.   MRN: [id number]  HPI Virtual Visit via Video Note  I connected with Melanie Cruz on 12/29/19 at 10:00 AM EST by a video enabled telemedicine application and verified that I am speaking with the correct person using two identifiers.  Location: Patient: home Provider:home  particpants- pt and myself.  lmp- last Saturday.  No vitals checked.   I discussed the limitations of evaluation and management by telemedicine and the availability of in person appointments. The patient expressed understanding and agreed to proceed.  History of Present Illness: Pt states she had panic attack yesterday after feeling anxious all day. States symptoms last for an hour and half. She had a lot health concerns as she expressed on my chart message. Pt is having some very frequent episodes of anxiety. Very frequently will get anxious easily.  Pt is smoker. I past I presrcibed wellbutrin. She does not take. Pt is smoking about 5 cigarettes. She thinks uses to control anxiety.  Pt ha have been less recently. Slight transient pain on left side cause her to be very anxious. Pt started to think about possible brain tumor which caused more anxiety.  Some faint minimal left side ear pressure.   Pt does appointment with neurologist Mar 07, 2019.     Observations/Objective:  General-no acute distress, pleasant, oriented. Lungs- on inspection lungs appear unlabored. Neck- no tracheal deviation or jvd on inspection. Neuro- gross motor function appears intact.  Assessment and Plan: Patient has recent daily anxiety with panic attack yesterday. Decided to go ahead and prescribe low-dose BuSpar. She will start with 7.5 mg dose and might need to increase to 15mg  depending on how she does. Advised try to get some daily exercise and eat healthy. Try conservative measures to reduce stress as well.  For history of smoking did refill  patient's Wellbutrin. She used it in the past briefly but was not consistent. Try to taper off smoking gradually as discussed.  History of intermittent low level headaches recently. Overall controlled compared to previously in the summer. Patient does have diclofenac available to use if Tylenol does not help her occasional headache. I did remind her not to drink any alcohol as on the last visit headaches seem to be associated with alcohol use. Counseled again that dehydration could lead to headache or set off migraine type headache potential.  History of mild elevated sugar. Did advise patient she can go ahead and get a metabolic panel and D3O. Those future labs already placed.  Follow-up in 2 to 3 weeks or as needed.  Mackie Pai, PA-C     Follow Up Instructions:    I discussed the assessment and treatment plan with the patient. The patient was provided an opportunity to ask questions and all were answered. The patient agreed with the plan and demonstrated an understanding of the instructions.   The patient was advised to call back or seek an in-person evaluation if the symptoms worsen or if the condition fails to improve as anticipated.  Time spent with patient today was 30  minutes which consisted of chart revdiew, discussing diagnosis, work up treatment and documentation.   Mackie Pai, PA-C    Review of Systems  Constitutional: Negative for chills and fatigue.  HENT: Negative for congestion.   Respiratory: Negative for cough and shortness of breath.   Cardiovascular: Negative for chest pain.  Gastrointestinal: Negative for abdominal pain.  Neurological: Negative  for dizziness, seizures, weakness and headaches.       See HPI.  Hematological: Negative for adenopathy. Does not bruise/bleed easily.  Psychiatric/Behavioral: Negative for behavioral problems, decreased concentration and suicidal ideas. The patient is nervous/anxious.        See HPI.       Objective:    Physical Exam        Assessment & Plan:

## 2019-12-30 ENCOUNTER — Encounter: Payer: Self-pay | Admitting: Medical

## 2020-01-12 ENCOUNTER — Encounter: Payer: Self-pay | Admitting: Medical

## 2020-01-17 ENCOUNTER — Ambulatory Visit (HOSPITAL_BASED_OUTPATIENT_CLINIC_OR_DEPARTMENT_OTHER)
Admission: RE | Admit: 2020-01-17 | Discharge: 2020-01-17 | Disposition: A | Discharge status: Home / Self Care | Payer: BC Managed Care – PPO | Source: Ambulatory Visit | Attending: Medical | Admitting: Medical

## 2020-01-17 ENCOUNTER — Other Ambulatory Visit: Payer: Self-pay

## 2020-01-17 ENCOUNTER — Telehealth: Payer: Self-pay | Admitting: Medical

## 2020-01-17 ENCOUNTER — Ambulatory Visit: Payer: BC Managed Care – PPO | Admitting: Medical

## 2020-01-17 VITALS — BP 117/68 | HR 60 | Temp 98.3°F | Resp 18 | Ht 64.0 in | Wt 231.0 lb

## 2020-01-17 DIAGNOSIS — M25562 Pain in left knee: Secondary | ICD-10-CM

## 2020-01-17 DIAGNOSIS — H6123 Impacted cerumen, bilateral: Secondary | ICD-10-CM | POA: Diagnosis not present

## 2020-01-17 DIAGNOSIS — R0981 Nasal congestion: Secondary | ICD-10-CM | POA: Diagnosis not present

## 2020-01-17 MED ORDER — FLUTICASONE PROPIONATE 50 MCG/ACT NA SUSP: 2.0000 | Freq: Every day | NASAL | 1 refills | Status: DC

## 2020-01-17 NOTE — Progress Notes (Signed)
Subjective:    Patient ID: Melanie Cruz, female    DOB: 03-12-1995, 25 y.o.   MRN: [id number]  HPI  Pt in for knee pain. Pt states week before christmas bent down real quick and felt severe sharp pain. Pt state pain for 3-4 hours severe then decreased.   Since then has been sore on and off.   Knee does pop occasionally.    Review of Systems  Constitutional: Negative for chills, fatigue and fever.  HENT: Positive for congestion and postnasal drip.        Some year round.  Respiratory: Negative for cough, chest tightness, shortness of breath and wheezing.   Cardiovascular: Negative for chest pain and palpitations.  Gastrointestinal: Negative for abdominal pain.  Musculoskeletal:       Left knee pain.  Skin: Negative for rash.  Neurological: Negative for dizziness and headaches.  Hematological: Negative for adenopathy. Does not bruise/bleed easily.    Past Medical History:  Diagnosis Date  . COVID-19   . Migraine      Social History   Socioeconomic History  . Marital status: Single    Spouse name: Not on file  . Number of children: Not on file  . Years of education: Not on file  . Highest education level: Not on file  Occupational History  . Not on file  Tobacco Use  . Smoking status: Current Every Day Smoker    Packs/day: 0.50    Years: 5.00    Pack years: 2.50    Types: Cigarettes  . Smokeless tobacco: Never Used  Substance and Sexual Activity  . Alcohol use: Yes  . Drug use: Not Currently    Types: Marijuana    Comment: occasional-last use 05/23/2017  . Sexual activity: Yes    Birth control/protection: None  Other Topics Concern  . Not on file  Social History Narrative  . Not on file   Social Determinants of Health   Financial Resource Strain: Not on file  Food Insecurity: Not on file  Transportation Needs: Not on file  Physical Activity: Not on file  Stress: Not on file  Social Connections: Not on file  Intimate Partner Violence: Not on file     Past Surgical History:  Procedure Laterality Date  . NO PAST SURGERIES      Family History  Problem Relation Age of Onset  . Hypertension Mother   . Breast cancer Paternal Aunt        age at onset 110's    No Known Allergies  Current Outpatient Medications on File Prior to Visit  Medication Sig Dispense Refill  . buPROPion (WELLBUTRIN XL) 150 MG 24 hr tablet Take 1 tablet (150 mg total) by mouth daily. 30 tablet 0  . busPIRone (BUSPAR) 7.5 MG tablet Take 1 tablet (7.5 mg total) by mouth 2 (two) times daily. 60 tablet 0  . diclofenac (VOLTAREN) 75 MG EC tablet 1 tab po bid prn headache. 20 tablet 0  . diclofenac Sodium (VOLTAREN) 1 % GEL Apply 2 g topically 4 (four) times daily as needed. 50 g 0  . etonogestrel-ethinyl estradiol (NUVARING) 0.12-0.015 MG/24HR vaginal ring Insert vaginally and leave in place for 3 consecutive weeks, then remove for 1 week. 3 each 3  . fluticasone (FLONASE) 50 MCG/ACT nasal spray Place 2 sprays into both nostrils daily. 16 g 1  . naproxen (NAPROSYN) 500 MG tablet Take 1 tablet (500 mg total) by mouth 2 (two) times daily. 30 tablet 0  . neomycin-polymyxin-hydrocortisone (CORTISPORIN)  OTIC solution Place 3 drops into the left ear 4 (four) times daily. 10 mL 0  . nicotine (NICODERM CQ - DOSED IN MG/24 HOURS) 21 mg/24hr patch Place 1 patch (21 mg total) onto the skin daily. 28 patch 0  . pantoprazole (PROTONIX) 40 MG tablet Take 1 tablet (40 mg total) by mouth daily. 30 tablet 1  . sulfamethoxazole-trimethoprim (BACTRIM DS) 800-160 MG tablet Take 1 tablet by mouth 2 (two) times daily. 14 tablet 0  . SUMAtriptan (IMITREX) 50 MG tablet 1 tab po onset of ha. Repeat in 2 hours if needed. 10 tablet 0  . valACYclovir (VALTREX) 1000 MG tablet Take 1 tablet (1,000 mg total) by mouth 2 (two) times daily. 20 tablet 0   No current facility-administered medications on file prior to visit.    BP 117/68   Pulse 60   Temp 98.3 F (36.8 C) (Oral)   Resp 18   Ht 5'  4" (1.626 m)   Wt 231 lb (104.8 kg)   LMP 12/17/2019   SpO2 100%   BMI 39.65 kg/m       Objective:   Physical Exam  General  Mental Status - Alert. General Appearance - Well groomed. Not in acute distress.  Skin Rashes- No Rashes.  HEENT Head- Normal. Ear Mild cerumen impaction both sides. Can't see tm well before ear lavage. After lavage  No wax came out but tms visible after lavage(wax rested against wall). Normal tm's  Eye Sclera/Conjunctiva- Left- Normal. Right- Normal. Nose & Sinuses Nasal Mucosa- Left-  Boggy and Congested. Right-  Boggy and  Congested.Bilateral maxillary and frontal sinus pressure.    Neck Neck- Supple. No Masses.   Chest and Lung Exam Auscultation: Breath Sounds:-Clear even and unlabored.  Cardiovascular Auscultation:Rythm- Regular, rate and rhythm. Murmurs & Other Heart Sounds:Ausculatation of the heart reveal- No Murmurs.  Lymphatic Head & Neck General Head & Neck Lymphatics: Bilateral: Description- No Localized lymphadenopathy.  Left knee- mild pain on rom. Faint pain tibial plateau.      Assessment & Plan:  Left knee pain for about one month. Pain intermittent despite one month post onset. Will get xray and review. Also will probably refer you to sports med to evaluate the cartidge areas for possible internal derangement. Pain mild but question if maybe meniscus injury.  Also pt request refill of her flonase. She states combo of her throught feeling dry at times but also feels pnd year round.  You do have bilateral mild  wax obstruction on both side/ear canals. Attempted lavage. No wax came out but tms visible after lavage(wax rested againt wall). Only recommend debrox if they feel plugged. Can use q tip to our portion but not deep. Can try irrigation in future as needed   Follow up in 7-10 days or as needed  Time spent with patient today was 30  minutes which consisted of chart review, discussing diagnosis, work up, treatment,  lavage and documentation.

## 2020-01-17 NOTE — Patient Instructions (Addendum)
Left knee pain for about one month. Pain intermittent despite one month post onset. Will get xray and review. Also will probably refer you to sports med to evaluate the cartidge areas for possible internal derangement. Pain mild but question if maybe meniscus injury.  Also pt request refill of her flonase. She states combo of her throught feeling dry at times but also feels pnd year round.  You do have bilateral mild  wax obstruction on both side/ear canals. Attempted lavage. No wax came out but tms visible after lavage(wax rested against wall). Only recommend debrox if they feel plugged. Can use q tip to our portion but not deep. Can try irrigation in future as needed  Follow up in 7-10 days or as needed

## 2020-01-17 NOTE — Telephone Encounter (Signed)
Referral to sports med placed.

## 2020-01-24 ENCOUNTER — Ambulatory Visit: Payer: BC Managed Care – PPO | Admitting: Family Medicine

## 2020-02-25 ENCOUNTER — Ambulatory Visit (INDEPENDENT_AMBULATORY_CARE_PROVIDER_SITE_OTHER): Payer: BC Managed Care – PPO | Admitting: Family Medicine

## 2020-02-25 ENCOUNTER — Other Ambulatory Visit: Payer: Self-pay

## 2020-02-25 ENCOUNTER — Ambulatory Visit: Payer: Self-pay

## 2020-02-25 VITALS — BP 100/60 | Ht 64.0 in | Wt 230.0 lb

## 2020-02-25 DIAGNOSIS — M25562 Pain in left knee: Secondary | ICD-10-CM

## 2020-02-25 DIAGNOSIS — S82153A Displaced fracture of unspecified tibial tuberosity, initial encounter for closed fracture: Secondary | ICD-10-CM | POA: Diagnosis not present

## 2020-02-25 NOTE — Patient Instructions (Signed)
Nice to meet you Happy Birthday!  Please try ice as needed  Please try the exercises   Please send me a message in MyChart with any questions or updates.  Please see [place] back as needed.   --Dr. Raeford Razor

## 2020-02-25 NOTE — Assessment & Plan Note (Signed)
Has chronic changes at the tibial tubercle.  No acute changes on exam today.  Reassuring knee exam otherwise. -Counseled on home exercise therapy and supportive care. -Could consider physical therapy.

## 2020-02-25 NOTE — Progress Notes (Signed)
  Melanie Cruz - 25 y.o. female MRN [id number]  Date of birth: 1995/11/16  SUBJECTIVE:  Including CC & ROS.  No chief complaint on file.   Melanie Cruz is a 25 y.o. female that is presenting with left knee pain.  The pain has been ongoing for a little over a month.  Having pain over the anterior aspect of the proximal tibia.  Did have pain after she was leaning on a metal object back in December.  Denies any surgery..  Independent review of the left knee x-ray from 1/10 shows no acute abnormalities.  Review of Systems See HPI   HISTORY: Past Medical, Surgical, Social, and Family History Reviewed & Updated per EMR.   Pertinent Historical Findings include:  Past Medical History:  Diagnosis Date  . COVID-19   . Migraine     Past Surgical History:  Procedure Laterality Date  . NO PAST SURGERIES      Family History  Problem Relation Age of Onset  . Hypertension Mother   . Breast cancer Paternal Aunt        age at onset 44's    Social History   Socioeconomic History  . Marital status: Single    Spouse name: Not on file  . Number of children: Not on file  . Years of education: Not on file  . Highest education level: Not on file  Occupational History  . Not on file  Tobacco Use  . Smoking status: Current Every Day Smoker    Packs/day: 0.50    Years: 5.00    Pack years: 2.50    Types: Cigarettes  . Smokeless tobacco: Never Used  Substance and Sexual Activity  . Alcohol use: Yes  . Drug use: Not Currently    Types: Marijuana    Comment: occasional-last use 05/23/2017  . Sexual activity: Yes    Birth control/protection: None  Other Topics Concern  . Not on file  Social History Narrative  . Not on file   Social Determinants of Health   Financial Resource Strain: Not on file  Food Insecurity: Not on file  Transportation Needs: Not on file  Physical Activity: Not on file  Stress: Not on file  Social Connections: Not on file  Intimate Partner Violence: Not on  file     PHYSICAL EXAM:  VS: BP 100/60   Ht 5\' 4"  (1.626 m)   Wt 230 lb (104.3 kg)   BMI 39.48 kg/m  Physical Exam Gen: NAD, alert, cooperative with exam, well-appearing MSK:  Left knee: Tenderness at the tibial tubercle. Normal range of motion. Normal strength resistance. No redness or swelling. Neurovascular intact  Limited ultrasound: Left knee:  No effusion of the suprapatellar pouch. Normal-appearing quadricep tendon. Enlargement of the tibial tubercle but no hyperemia.  Normal.  Patellar tendon. Normal-appearing medial joint space. Normal-appearing lateral joint space.   Summary: Findings consistent with chronic changes of the tibial tubercle  Ultrasound and interpretation by Clearance Coots, MD    ASSESSMENT & PLAN:   Avulsion fracture of tibial tuberosity Has chronic changes at the tibial tubercle.  No acute changes on exam today.  Reassuring knee exam otherwise. -Counseled on home exercise therapy and supportive care. -Could consider physical therapy.

## 2020-02-27 DIAGNOSIS — J029 Acute pharyngitis, unspecified: Secondary | ICD-10-CM | POA: Diagnosis not present

## 2020-02-27 DIAGNOSIS — Z20822 Contact with and (suspected) exposure to covid-19: Secondary | ICD-10-CM | POA: Diagnosis not present

## 2020-03-01 ENCOUNTER — Other Ambulatory Visit: Payer: Self-pay

## 2020-03-01 ENCOUNTER — Encounter: Payer: Self-pay | Admitting: Family Medicine

## 2020-03-01 ENCOUNTER — Ambulatory Visit (INDEPENDENT_AMBULATORY_CARE_PROVIDER_SITE_OTHER): Payer: BC Managed Care – PPO | Admitting: Family Medicine

## 2020-03-01 VITALS — BP 117/69 | HR 75 | Ht 64.0 in | Wt 232.0 lb

## 2020-03-01 DIAGNOSIS — Z716 Tobacco abuse counseling: Secondary | ICD-10-CM | POA: Diagnosis not present

## 2020-03-01 DIAGNOSIS — N914 Secondary oligomenorrhea: Secondary | ICD-10-CM | POA: Diagnosis not present

## 2020-03-01 DIAGNOSIS — N912 Amenorrhea, unspecified: Secondary | ICD-10-CM | POA: Diagnosis not present

## 2020-03-01 LAB — POCT URINE PREGNANCY: Preg Test, Ur: NEGATIVE

## 2020-03-01 MED ORDER — NICOTINE 10 MG IN INHA: 1.0000 | RESPIRATORY_TRACT | 0 refills | Status: DC | PRN

## 2020-03-01 NOTE — Progress Notes (Signed)
   Subjective:    Patient ID: Melanie Cruz, female    DOB: 1995/07/18, 25 y.o.   MRN: [id number]  HPI Patient presenting with abnormal menstrual cycles.  Her cycles have a interval of 35-40+ days.  Menses are 5 to 7 days.  This has been going on for a few months.  She is currently going on 40 days from her last cycle.  No pelvic pain, hirsutism, abnormal discharge.  Negative pregnancy test at home   Review of Systems     Objective:   Physical Exam Vitals reviewed.  Cardiovascular:     Rate and Rhythm: Normal rate and regular rhythm.  Pulmonary:     Effort: Pulmonary effort is normal.     Breath sounds: Normal breath sounds.  Psychiatric:        Mood and Affect: Mood normal.        Behavior: Behavior normal.        Thought Content: Thought content normal.        Judgment: Judgment normal.        Assessment & Plan:  1. Amenorrhea Negative pregnancy test - POCT urine pregnancy  2. Secondary oligomenorrhea We will check hormone labs.  Discussed use of contraception.  Patient going to wait until start next cycle, then start - TSH - Follicle stimulating hormone - LH - Hemoglobin A1c - TestT+TestF+SHBG  3.  Tobacco abuse Discussed cessation options with medications.  Currently smoking approximately half a pack a day.  Patient would like to stop smoking.  Will prescribe Nicotrol inhaler

## 2020-03-01 NOTE — Progress Notes (Signed)
Last period Jan 22, 2020.  Patient had negative pregnancy test at home. Kathrene Alu RN

## 2020-03-06 ENCOUNTER — Encounter: Payer: Self-pay | Admitting: Neurology

## 2020-03-06 ENCOUNTER — Ambulatory Visit: Payer: BC Managed Care – PPO | Admitting: Neurology

## 2020-03-06 ENCOUNTER — Other Ambulatory Visit: Payer: Self-pay

## 2020-03-06 ENCOUNTER — Other Ambulatory Visit: Payer: Self-pay | Admitting: Medical

## 2020-03-06 ENCOUNTER — Encounter: Payer: Self-pay | Admitting: Medical

## 2020-03-06 DIAGNOSIS — G44209 Tension-type headache, unspecified, not intractable: Secondary | ICD-10-CM | POA: Insufficient documentation

## 2020-03-06 DIAGNOSIS — G44229 Chronic tension-type headache, not intractable: Secondary | ICD-10-CM

## 2020-03-06 DIAGNOSIS — G43009 Migraine without aura, not intractable, without status migrainosus: Secondary | ICD-10-CM | POA: Insufficient documentation

## 2020-03-06 MED ORDER — BUSPIRONE HCL 7.5 MG PO TABS: 7.5000 mg | ORAL_TABLET | Freq: Two times a day (BID) | ORAL | 3 refills | Status: DC

## 2020-03-06 MED ORDER — RIZATRIPTAN BENZOATE 10 MG PO TBDP: 10.0000 mg | ORAL_TABLET | ORAL | 11 refills | Status: DC | PRN

## 2020-03-06 NOTE — Progress Notes (Signed)
[facility]    Provider:  Dr Jaynee Eagles Requesting Provider: Elise Benne Primary Care Provider:  Elise Benne  CC:  Headaches  HPI:  Melanie Cruz is a 25 y.o. female here as requested by Mackie Pai, PA-C for migraines/headaches. PMHx Migraines, anxiety, panic attacks.   She Is here alone and reports that she  had an anxiety attack, she has significant anxiety, she is not having her , dehydration can trigger a headache, hunger, unilateral, behind the eyes, spreads all over, pounding/pulsating/throbbing, movement makes it worse, light/sound sensitivity, nausea, can be severe,Can be moderately severe and can last 24-72 hours, she gets the headaches 2x a month, no vision changes with the headaches, no severe nausea, not positional, not a morning predominance, she usually knows what triggers them either sleep or alcohol or stress and they are responsive to medications. Tyelnol extra strength helps, sumatriptan did not help. Most of the time it is triggered, no vision changes. Poor sleeping habits, goes to bed late and watches TV in bed. Binge drinks occ, she has spoke to her pcp about this. She also has some tension headaches likely stress. Headaches not worsening, slightly better.  No other focal neurologic deficits, associated symptoms, inciting events or modifiable factors.  Reviewed notes, labs and imaging from outside physicians, which showed:   I reviewed Dr. Ernie Avena notes, she was referred to our office and no showed a new patient appointment in August 2021: Patient has a history of headaches, associated with noise sensitivity, Tylenol extra strength for her headaches , Imitrex in the past did not help, patient was seen on December 17, 2019 for a headache, the night before she drank half a bottle of vodka and did not sleep, patient drinks heavily on weekends, not having frequent headaches now but it was more frequent in the summertime, no neck pain  associated with a headache, no nausea or vomiting, she snores on occasion, sometimes has headache in the morning, she was seen again in follow-up a few weeks later, reported panic attacks and anxiety, patient is a current smoker and she was prescribed Wellbutrin, transient pain on the left side causes her to be very anxious think she might have a possible brain tumor which causes more anxiety, some left ear pressure.  Also history of intermittent low level headaches recently.  Diclofenac and Tylenol as needed, they did discuss alcohol cessation has headaches seem to be associated with alcohol use, we discussed dehydration that could lead to headache.   Cbc/cmp unremarkable  From a thorough review of records, medications tried that can be used in migraine management include: Tylenol, Stadol, Flexeril, Decadron tablets, diclofenac tablets, ibuprofen, naproxen, Zofran injections and oral, prednisone tablets, Phenergan injections, sumatriptan oral  Ct 10/2016 showed No acute intracranial abnormalities including mass lesion or mass effect, hydrocephalus, extra-axial fluid collection, midline shift, hemorrhage, or acute infarction, large ischemic events (personally reviewed images)  Review of Systems: Patient complains of symptoms per HPI as well as the following symptoms:anxiety, headaches. Pertinent negatives and positives per HPI. All others negative.   Social History   Socioeconomic History  . Marital status: Single    Spouse name: Not on file  . Number of children: Not on file  . Years of education: Not on file  . Highest education level: Not on file  Occupational History  . Not on file  Tobacco Use  . Smoking status: Current Every Day Smoker    Packs/day: 0.50    Years: 5.00    Pack  years: 2.50    Types: Cigarettes  . Smokeless tobacco: Never Used  Vaping Use  . Vaping Use: Former  Substance and Sexual Activity  . Alcohol use: Yes    Comment: occasionally   . Drug use: Not  Currently    Types: Marijuana    Comment: occasional-last use 05/23/2017  . Sexual activity: Yes    Birth control/protection: Inserts    Comment: nuvaring   Other Topics Concern  . Not on file  Social History Narrative   Lives with roommate   Right handed   Caffeine: 0-1 cup/day   Social Determinants of Health   Financial Resource Strain: Not on file  Food Insecurity: Not on file  Transportation Needs: Not on file  Physical Activity: Not on file  Stress: Not on file  Social Connections: Not on file  Intimate Partner Violence: Not on file    Family History  Problem Relation Age of Onset  . Hypertension Mother   . Diabetes Mother   . Breast cancer Paternal Aunt        age at onset 27's  . Migraines Neg Hx     Past Medical History:  Diagnosis Date  . COVID-19   . Migraine     Patient Active Problem List   Diagnosis Date Noted  . Migraine without aura and without status migrainosus, not intractable 03/06/2020  . Tension type headache 03/06/2020  . Avulsion fracture of tibial tuberosity 02/25/2020  . 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 Tobacco abuse counseling 03/13/2017    Past Surgical History:  Procedure Laterality Date  . NO PAST SURGERIES      Current Outpatient Medications  Medication Sig Dispense Refill  . diclofenac (VOLTAREN) 75 MG EC tablet 1 tab po bid prn headache. 20 tablet 0  . etonogestrel-ethinyl estradiol (NUVARING) 0.12-0.015 MG/24HR vaginal ring Insert vaginally and leave in place for 3 consecutive weeks, then remove for 1 week. 3 each 3  . fluticasone (FLONASE) 50 MCG/ACT nasal spray Place 2 sprays into both nostrils daily. 16 g 1  . naproxen (NAPROSYN) 500 MG tablet Take 1 tablet (500 mg total) by mouth 2 (two) times daily. 30 tablet 0  . nicotine (NICOTROL) 10 MG inhaler Inhale 1 Cartridge (1 continuous puffing total) into the lungs as needed for smoking cessation. 42 each 0  . pantoprazole (PROTONIX) 40 MG tablet Take 1 tablet (40  mg total) by mouth daily. 30 tablet 1  . rizatriptan (MAXALT-MLT) 10 MG disintegrating tablet Take 1 tablet (10 mg total) by mouth as needed for migraine. May repeat in 2 hours if needed 9 tablet 11  . SUMAtriptan (IMITREX) 50 MG tablet 1 tab po onset of ha. Repeat in 2 hours if needed. 10 tablet 0  . buPROPion (WELLBUTRIN XL) 150 MG 24 hr tablet Take 1 tablet (150 mg total) by mouth daily. 90 tablet 1  . busPIRone (BUSPAR) 7.5 MG tablet Take 1 tablet (7.5 mg total) by mouth 2 (two) times daily. 60 tablet 3   No current facility-administered medications for this visit.    Allergies as of 03/06/2020  . (No Known Allergies)    Vitals: BP 107/68 (BP Location: Left Arm, Patient Position: Sitting)   Pulse 61   Ht 5\' 4"  (1.626 m)   Wt 230 lb (104.3 kg)   BMI 39.48 kg/m  Last Weight:  Wt Readings from Last 1 Encounters:  03/06/20 230 lb (104.3 kg)   Last Height:   Ht Readings from Last 1 Encounters:  03/06/20 5\' 4"  (1.626 m)     Physical exam: Exam: Gen: NAD, conversant, well nourised, obese, well groomed                     CV: RRR, no MRG. No Carotid Bruits. No peripheral edema, warm, nontender Eyes: Conjunctivae clear without exudates or hemorrhage  Neuro: Detailed Neurologic Exam  Speech:    Speech is normal; fluent and spontaneous with normal comprehension.  Cognition:    The patient is oriented to person, place, and time;     recent and remote memory intact;     language fluent;     normal attention, concentration,     fund of knowledge Cranial Nerves:    The pupils are equal, round, and reactive to light. The fundi are normal and spontaneous venous pulsations are present. Visual fields are full to finger confrontation. Extraocular movements are intact. Trigeminal sensation is intact and the muscles of mastication are normal. The face is symmetric. The palate elevates in the midline. Hearing intact. Voice is normal. Shoulder shrug is normal. The tongue has normal motion  without fasciculations.   Coordination:    No dysmetria or ataxia   Gait:    Normal native gait  Motor Observation:    No asymmetry, no atrophy, and no involuntary movements noted. Tone:    Normal muscle tone.    Posture:    Posture is normal. normal erect    Strength:    Strength is V/V in the upper and lower limbs.      Sensation: intact to LT     Reflex Exam:  DTR's:    Deep tendon reflexes in the upper and lower extremities are normal bilaterally.   Toes:    The toes are downgoing bilaterally.   Clonus:    Clonus is absent.    Assessment/Plan: This is a really sweet 25 year old with headaches and migraines, anxiety and panic attacks.  We had a long discussion about her headaches and migraines today, she usually gets about 2 a month brought on by alcohol intake, poor sleep, dehydration, she can usually identify a trigger.  No red flags, no vision changes, not positional, no morning predominance, neurologic exam is normal.  I tried to reassure patient, we reviewed the CT of her head images together from 2018, headaches and migraines are responsive to treatment.  At this time I counseled her on lifestyle changes, better sleep habits, for her anxiety she is not taking her medications regularly, discussed stress and headaches.   No orders of the defined types were placed in this encounter.  Meds ordered this encounter  Medications  . rizatriptan (MAXALT-MLT) 10 MG disintegrating tablet    Sig: Take 1 tablet (10 mg total) by mouth as needed for migraine. May repeat in 2 hours if needed    Dispense:  9 tablet    Refill:  11    Cc: Saguier, Iris Pert,  Saguier, [place], PA-C  Sarina Ill, MD  [facility] [address] [place], [place] [address]  Phone [phone] Fax [phone]

## 2020-03-06 NOTE — Patient Instructions (Addendum)
Maxalt: Rizatriptan: Please take one tablet at the onset of your headache. If it does not improve the symptoms please take one additional tablet. Do not take more then 2 tablets in 24hrs. Do not take use more then 2 to 3 times in a week.   Tension Headache, Adult A tension headache is a feeling of pain, pressure, or aching over the front and sides of the head. The pain can be dull, or it can feel tight. There are two types of tension headache:  Episodic tension headache. This is when the headaches happen fewer than 15 days a month.  Chronic tension headache. This is when the headaches happen more than 15 days a month during a 46-month period. A tension headache can last from 30 minutes to several days. It is the most common kind of headache. Tension headaches are not normally associated with nausea or vomiting, and they do not get worse with physical activity. What are the causes? The exact cause of this condition is not known. Tension headaches are often triggered by stress, anxiety, or depression. Other triggers may include:  Alcohol.  Too much caffeine or caffeine withdrawal.  Respiratory infections, such as colds, flu, or sinus infections.  Dental problems or teeth clenching.  Fatigue.  Holding your head and neck in the same position for a long period of time, such as while using a computer.  Smoking.  Arthritis of the neck. What are the signs or symptoms? Symptoms of this condition include:  A feeling of pressure or tightness around the head.  Dull, aching head pain.  Pain over the front and sides of the head.  Tenderness in the muscles of the head, neck, and shoulders. How is this diagnosed? This condition may be diagnosed based on your symptoms, your medical history, and a physical exam. If your symptoms are severe or unusual, you may have imaging tests, such as a CT scan or an MRI of your head. Your vision may also be checked. How is this treated? This condition may  be treated with lifestyle changes and with medicines that help relieve symptoms. Follow these instructions at home: Managing pain  Take over-the-counter and prescription medicines only as told by your health care provider.  When you have a headache, lie down in a dark, quiet room.  If directed, put ice on your head and neck. To do this: ? Put ice in a plastic bag. ? Place a towel between your skin and the bag. ? Leave the ice on for 20 minutes, 2-3 times a day. ? Remove the ice if your skin turns bright red. This is very important. If you cannot feel pain, heat, or cold, you have a greater risk of damage to the area.  If directed, apply heat to the back of your neck as often as told by your health care provider. Use the heat source that your health care provider recommends, such as a moist heat pack or a heating pad. ? Place a towel between your skin and the heat source. ? Leave the heat on for 20-30 minutes. ? Remove the heat if your skin turns bright red. This is especially important if you are unable to feel pain, heat, or cold. You have a greater risk of getting burned. Eating and drinking  Eat meals on a regular schedule.  If you drink alcohol: ? Limit how much you have to:  0-1 drink a day for women who are not pregnant.  0-2 drinks a day for men. ?  Know how much alcohol is in your drink. In the [place] one drink equals one 12 oz bottle of beer (355 mL), one 5 oz glass of wine (148 mL), or one 1 oz glass of hard liquor (44 mL).  Drink enough fluid to keep your urine pale yellow.  Decrease your caffeine intake, or stop using caffeine. Lifestyle  Get 7-9 hours of sleep each night, or get the amount of sleep recommended by your health care provider.  At bedtime, remove computers, phones, and tablets from your room.  Find ways to manage your stress. This may include: ? Exercise. ? Deep breathing exercises. ? Yoga. ? Listening to music. ? Positive mental imagery.  Try to  sit up straight and avoid tensing your muscles.  Do not use any products that contain nicotine or tobacco. These include cigarettes, chewing tobacco, and vaping devices, such as e-cigarettes. If you need help quitting, ask your health care provider. General instructions  Avoid any headache triggers. Keep a journal to help find out what may trigger your headaches. For example, write down: ? What you eat and drink. ? How much sleep you get. ? Any change to your diet or medicines.  Keep all follow-up visits. This is important.   Contact a health care provider if:  Your headache does not get better.  Your headache comes back.  You are sensitive to sounds, light, or smells because of a headache.  You have nausea or you vomit.  Your stomach hurts. Get help right away if:  You suddenly develop a severe headache, along with any of the following: ? A stiff neck. ? Nausea and vomiting. ? Confusion. ? Weakness in one part or one side of your body. ? Double vision or loss of vision. ? Shortness of breath. ? Rash. ? Unusual sleepiness. ? Fever or chills. ? Trouble speaking. ? Pain in your eye or ear. ? Trouble walking or balancing. ? Feeling faint or passing out. Summary  A tension headache is a feeling of pain, pressure, or aching over the front and sides of the head.  A tension headache can last from 30 minutes to several days. It is the most common kind of headache.  This condition may be diagnosed based on your symptoms, your medical history, and a physical exam.  This condition may be treated with lifestyle changes and with medicines that help relieve symptoms. This information is not intended to replace advice given to you by your health care provider. Make sure you discuss any questions you have with your health care provider. Document Revised: 09/23/2019 Document Reviewed: 09/23/2019 Elsevier Patient Education  2021 [place].  Migraine Headache A migraine headache  is an intense, throbbing pain on one side or both sides of the head. Migraine headaches may also cause other symptoms, such as nausea, vomiting, and sensitivity to light and noise. A migraine headache can last from 4 hours to 3 days. Talk with your doctor about what things may bring on (trigger) your migraine headaches. What are the causes? The exact cause of this condition is not known. However, a migraine may be caused when nerves in the brain become irritated and release chemicals that cause inflammation of blood vessels. This inflammation causes pain. This condition may be triggered or caused by:  Drinking alcohol.  Smoking.  Taking medicines, such as: ? Medicine used to treat chest pain (nitroglycerin). ? Birth control pills. ? Estrogen. ? Certain blood pressure medicines.  Eating or drinking products that contain nitrates, glutamate,  aspartame, or tyramine. Aged cheeses, chocolate, or caffeine may also be triggers.  Doing physical activity. Other things that may trigger a migraine headache include:  Menstruation.  Pregnancy.  Hunger.  Stress.  Lack of sleep or too much sleep.  Weather changes.  Fatigue. What increases the risk? The following factors may make you more likely to experience migraine headaches:  Being a certain age. This condition is more common in people who are 54-37 years old.  Being female.  Having a family history of migraine headaches.  Being Caucasian.  Having a mental health condition, such as depression or anxiety.  Being obese. What are the signs or symptoms? The main symptom of this condition is pulsating or throbbing pain. This pain may:  Happen in any area of the head, such as on one side or both sides.  Interfere with daily activities.  Get worse with physical activity.  Get worse with exposure to bright lights or loud noises. Other symptoms may include:  Nausea.  Vomiting.  Dizziness.  General sensitivity to bright  lights, loud noises, or smells. Before you get a migraine headache, you may get warning signs (an aura). An aura may include:  Seeing flashing lights or having blind spots.  Seeing bright spots, halos, or zigzag lines.  Having tunnel vision or blurred vision.  Having numbness or a tingling feeling.  Having trouble talking.  Having muscle weakness. Some people have symptoms after a migraine headache (postdromal phase), such as:  Feeling tired.  Difficulty concentrating. How is this diagnosed? A migraine headache can be diagnosed based on:  Your symptoms.  A physical exam.  Tests, such as: ? CT scan or an MRI of the head. These imaging tests can help rule out other causes of headaches. ? Taking fluid from the spine (lumbar puncture) and analyzing it (cerebrospinal fluid analysis, or CSF analysis). How is this treated? This condition may be treated with medicines that:  Relieve pain.  Relieve nausea.  Prevent migraine headaches. Treatment for this condition may also include:  Acupuncture.  Lifestyle changes like avoiding foods that trigger migraine headaches.  Biofeedback.  Cognitive behavioral therapy. Follow these instructions at home: Medicines  Take over-the-counter and prescription medicines only as told by your health care provider.  Ask your health care provider if the medicine prescribed to you: ? Requires you to avoid driving or using heavy machinery. ? Can cause constipation. You may need to take these actions to prevent or treat constipation:  Drink enough fluid to keep your urine pale yellow.  Take over-the-counter or prescription medicines.  Eat foods that are high in fiber, such as beans, whole grains, and fresh fruits and vegetables.  Limit foods that are high in fat and processed sugars, such as fried or sweet foods. Lifestyle  Do not drink alcohol.  Do not use any products that contain nicotine or tobacco, such as cigarettes,  e-cigarettes, and chewing tobacco. If you need help quitting, ask your health care provider.  Get at least 8 hours of sleep every night.  Find ways to manage stress, such as meditation, deep breathing, or yoga. General instructions  Keep a journal to find out what may trigger your migraine headaches. For example, write down: ? What you eat and drink. ? How much sleep you get. ? Any change to your diet or medicines.  If you have a migraine headache: ? Avoid things that make your symptoms worse, such as bright lights. ? It may help to lie down in a  dark, quiet room. ? Do not drive or use heavy machinery. ? Ask your health care provider what activities are safe for you while you are experiencing symptoms.  Keep all follow-up visits as told by your health care provider. This is important.      Contact a health care provider if:  You develop symptoms that are different or more severe than your usual migraine headache symptoms.  You have more than 15 headache days in one month. Get help right away if:  Your migraine headache becomes severe.  Your migraine headache lasts longer than 72 hours.  You have a fever.  You have a stiff neck.  You have vision loss.  Your muscles feel weak or like you cannot control them.  You start to lose your balance often.  You have trouble walking.  You faint.  You have a seizure. Summary  A migraine headache is an intense, throbbing pain on one side or both sides of the head. Migraines may also cause other symptoms, such as nausea, vomiting, and sensitivity to light and noise.  This condition may be treated with medicines and lifestyle changes. You may also need to avoid certain things that trigger a migraine headache.  Keep a journal to find out what may trigger your migraine headaches.  Contact your health care provider if you have more than 15 headache days in a month or you develop symptoms that are different or more severe than your  usual migraine headache symptoms. This information is not intended to replace advice given to you by your health care provider. Make sure you discuss any questions you have with your health care provider. Document Revised: 04/17/2018 Document Reviewed: 02/05/2018 Elsevier Patient Education  2021 [place].  Rizatriptan disintegrating tablets What is this medicine? RIZATRIPTAN (rye za TRIP tan) is used to treat migraines with or without aura. An aura is a strange feeling or visual disturbance that warns you of an attack. It is not used to prevent migraines. This medicine may be used for other purposes; ask your health care provider or pharmacist if you have questions. COMMON [place] NAME(S): Maxalt-MLT What should I tell my health care provider before I take this medicine? They need to know if you have any of these conditions:  cigarette smoker  circulation problems in fingers and toes  diabetes  heart disease  high blood pressure  high cholesterol  history of irregular heartbeat  history of stroke  kidney disease  liver disease  stomach or intestine problems  an unusual or allergic reaction to rizatriptan, other medicines, foods, dyes, or preservatives  pregnant or trying to get pregnant  breast-feeding How should I use this medicine? Take this medicine by mouth. Follow the directions on the prescription label. Leave the tablet in the sealed blister pack until you are ready to take it. With dry hands, open the blister and gently remove the tablet. If the tablet breaks or crumbles, throw it away and take a new tablet out of the blister pack. Place the tablet in the mouth and allow it to dissolve, and then swallow. Do not cut, crush, or chew this medicine. You do not need water to take this medicine. Do not take it more often than directed. Talk to your pediatrician regarding the use of this medicine in children. While this drug may be prescribed for children as young as 6  years for selected conditions, precautions do apply. Overdosage: If you think you have taken too much of this medicine contact  a poison control center or emergency room at once. NOTE: This medicine is only for you. Do not share this medicine with others. What if I miss a dose? This does not apply. This medicine is not for regular use. What may interact with this medicine? Do not take this medicine with any of the following medicines:  certain medicines for migraine headache like almotriptan, eletriptan, frovatriptan, naratriptan, rizatriptan, sumatriptan, zolmitriptan  ergot alkaloids like dihydroergotamine, ergonovine, ergotamine, methylergonovine  MAOIs like Carbex, Eldepryl, Marplan, Nardil, and Parnate This medicine may also interact with the following medications:  certain medicines for depression, anxiety, or psychotic disorders  propranolol This list may not describe all possible interactions. Give your health care provider a list of all the medicines, herbs, non-prescription drugs, or dietary supplements you use. Also tell them if you smoke, drink alcohol, or use illegal drugs. Some items may interact with your medicine. What should I watch for while using this medicine? Visit your healthcare professional for regular checks on your progress. Tell your healthcare professional if your symptoms do not start to get better or if they get worse. You may get drowsy or dizzy. Do not drive, use machinery, or do anything that needs mental alertness until you know how this medicine affects you. Do not stand up or sit up quickly, especially if you are an older patient. This reduces the risk of dizzy or fainting spells. Alcohol may interfere with the effect of this medicine. Your mouth may get dry. Chewing sugarless gum or sucking hard candy and drinking plenty of water may help. Contact your healthcare professional if the problem does not go away or is severe. If you take migraine medicines for 10  or more days a month, your migraines may get worse. Keep a diary of headache days and medicine use. Contact your healthcare professional if your migraine attacks occur more frequently. What side effects may I notice from receiving this medicine? Side effects that you should report to your doctor or health care professional as soon as possible:  allergic reactions like skin rash, itching or hives, swelling of the face, lips, or tongue  chest pain or chest tightness  signs and symptoms of a dangerous change in heartbeat or heart rhythm like chest pain; dizziness; fast, irregular heartbeat; palpitations; feeling faint or lightheaded; falls; breathing problems  signs and symptoms of a stroke like changes in vision; confusion; trouble speaking or understanding; severe headaches; sudden numbness or weakness of the face, arm or leg; trouble walking; dizziness; loss of balance or coordination  signs and symptoms of serotonin syndrome like irritable; confusion; diarrhea; fast or irregular heartbeat; muscle twitching; stiff muscles; trouble walking; sweating; high fever; seizures; chills; vomiting Side effects that usually do not require medical attention (report to your doctor or health care professional if they continue or are bothersome):  diarrhea  dizziness  drowsiness  dry mouth  headache  nausea, vomiting  pain, tingling, numbness in the hands or feet  stomach pain This list may not describe all possible side effects. Call your doctor for medical advice about side effects. You may report side effects to FDA at 1-800-FDA-1088. Where should I keep my medicine? Keep out of the reach of children. Store at room temperature between 15 and 30 degrees C (59 and 86 degrees F). Protect from light and moisture. Throw away any unused medicine after the expiration date. NOTE: This sheet is a summary. It may not cover all possible information. If you have questions about this medicine, talk  to your  doctor, pharmacist, or health care provider.  2021 Elsevier/Gold Standard (2017-07-08 14:58:08)

## 2020-03-08 ENCOUNTER — Other Ambulatory Visit: Payer: Self-pay | Admitting: Family Medicine

## 2020-03-08 LAB — TESTT+TESTF+SHBG
Sex Hormone Binding: 25.1 nmol/L (ref 24.6–122.0)
Testosterone, Free: 1.2 pg/mL (ref 0.0–4.2)
Testosterone, Total, LC/MS: 17.3 ng/dL (ref 10.0–55.0)

## 2020-03-08 LAB — LUTEINIZING HORMONE: LH: 7.7 m[IU]/mL

## 2020-03-08 LAB — HEMOGLOBIN A1C
Est. average glucose Bld gHb Est-mCnc: 123 mg/dL
Hgb A1c MFr Bld: 5.9 % — ABNORMAL HIGH (ref 4.8–5.6)

## 2020-03-08 LAB — TSH: TSH: 1.13 u[IU]/mL (ref 0.450–4.500)

## 2020-03-08 LAB — FOLLICLE STIMULATING HORMONE: FSH: 3 m[IU]/mL

## 2020-03-08 MED ORDER — METRONIDAZOLE 500 MG PO TABS: 500.0000 mg | ORAL_TABLET | Freq: Two times a day (BID) | ORAL | 0 refills | Status: DC

## 2020-03-08 MED ORDER — METFORMIN HCL 500 MG PO TABS: 500.0000 mg | ORAL_TABLET | Freq: Two times a day (BID) | ORAL | 5 refills | Status: DC

## 2020-03-08 MED FILL — METFORMIN HCL 500 MG TABS: 500 | 30 days supply | Qty: 60 | Fill #0

## 2020-03-08 MED FILL — METRONIDAZOLE 500 MG TABS: 500 | 7 days supply | Qty: 14 | Fill #0

## 2020-03-08 NOTE — Addendum Note (Signed)
Addended by: Truett Mainland on: 03/08/2020 01:16 PM   Modules accepted: Orders

## 2020-03-08 NOTE — Addendum Note (Signed)
Addended by: Truett Mainland on: 03/08/2020 04:55 PM   Modules accepted: Orders

## 2020-03-14 ENCOUNTER — Encounter: Payer: Self-pay | Admitting: Medical

## 2020-03-14 ENCOUNTER — Telehealth: Payer: Self-pay

## 2020-03-14 ENCOUNTER — Telehealth: Payer: Self-pay | Admitting: Medical

## 2020-03-14 DIAGNOSIS — J3489 Other specified disorders of nose and nasal sinuses: Secondary | ICD-10-CM

## 2020-03-14 DIAGNOSIS — K130 Diseases of lips: Secondary | ICD-10-CM

## 2020-03-14 NOTE — Telephone Encounter (Signed)
Referral denied and closed. Patient states that she no longer had the dry nasal or a current lip lesion, and she felt like an ENT wasn't what she needed for her HSV1 diagnoses. She really just wants info with do's and don't for her condition. She wants knowledge. I offered her an appt. She declined and states she might follow up with her GYN to see if they can provide more knowledge.

## 2020-03-14 NOTE — Telephone Encounter (Signed)
Placed referral to ent second time. Can you give pt number to ENT office that way she can call herself. Last year she missed the call? Already year transpired?

## 2020-03-14 NOTE — Telephone Encounter (Signed)
New referral placed for ENT  

## 2020-03-14 NOTE — Telephone Encounter (Signed)
Patient wants referral to ENT for lesion , referral from 03/2019 was cancelled due to patient not answering the phone , please place referral again.

## 2020-03-14 NOTE — Telephone Encounter (Signed)
Noted  

## 2020-03-15 ENCOUNTER — Other Ambulatory Visit: Payer: Self-pay

## 2020-03-15 ENCOUNTER — Other Ambulatory Visit (HOSPITAL_COMMUNITY)
Admission: RE | Admit: 2020-03-15 | Discharge: 2020-03-15 | Disposition: A | Discharge status: Home / Self Care | Payer: BC Managed Care – PPO | Source: Ambulatory Visit | Attending: Obstetrics & Gynecology | Admitting: Obstetrics & Gynecology

## 2020-03-15 ENCOUNTER — Ambulatory Visit: Payer: BC Managed Care – PPO

## 2020-03-15 VITALS — BP 103/58 | HR 81 | Ht 64.0 in | Wt 229.0 lb

## 2020-03-15 DIAGNOSIS — B9689 Other specified bacterial agents as the cause of diseases classified elsewhere: Secondary | ICD-10-CM | POA: Diagnosis not present

## 2020-03-15 DIAGNOSIS — Z113 Encounter for screening for infections with a predominantly sexual mode of transmission: Secondary | ICD-10-CM | POA: Diagnosis not present

## 2020-03-15 DIAGNOSIS — N760 Acute vaginitis: Secondary | ICD-10-CM | POA: Insufficient documentation

## 2020-03-15 DIAGNOSIS — B373 Candidiasis of vulva and vagina: Secondary | ICD-10-CM | POA: Diagnosis not present

## 2020-03-15 NOTE — Progress Notes (Signed)
Patient presents for std screening due to exposure to partner with a trich related infection. Patient made aware we will send for vaginal culture and let her know results. Kathrene Alu RN

## 2020-03-15 NOTE — Progress Notes (Signed)
Chart reviewed - agree with CMA/RN documentation.  ° °

## 2020-03-16 ENCOUNTER — Other Ambulatory Visit: Payer: Self-pay

## 2020-03-16 DIAGNOSIS — B379 Candidiasis, unspecified: Secondary | ICD-10-CM

## 2020-03-16 LAB — CERVICOVAGINAL ANCILLARY ONLY
Bacterial Vaginitis (gardnerella): POSITIVE — AB
Candida Glabrata: NEGATIVE
Candida Vaginitis: POSITIVE — AB
Chlamydia: NEGATIVE
Comment: NEGATIVE
Comment: NEGATIVE
Comment: NEGATIVE
Comment: NEGATIVE
Comment: NEGATIVE
Comment: NORMAL
Neisseria Gonorrhea: NEGATIVE
Trichomonas: NEGATIVE

## 2020-03-16 MED ORDER — FLUCONAZOLE 150 MG PO TABS: ORAL_TABLET | ORAL | 1 refills | Status: DC

## 2020-03-16 NOTE — Progress Notes (Signed)
Called pt to discuss positive BV and yeast results. Pt was prescribed Flagyl on 03/08/20 and states she only took 2 tablets because she was on a different antibiotic. Pt made aware that she can finish taking the Flagyl then take the Diflucan for yeast. Pt also made aware that she was negative for all other STI's.  Understanding was voiced.

## 2020-03-30 ENCOUNTER — Ambulatory Visit: Payer: BC Managed Care – PPO | Admitting: Medical

## 2020-04-06 ENCOUNTER — Other Ambulatory Visit: Payer: Self-pay

## 2020-04-06 ENCOUNTER — Other Ambulatory Visit: Payer: Self-pay | Admitting: Family Medicine

## 2020-04-06 ENCOUNTER — Ambulatory Visit: Payer: BC Managed Care – PPO | Admitting: Family Medicine

## 2020-04-06 ENCOUNTER — Encounter: Payer: Self-pay | Admitting: Family Medicine

## 2020-04-06 VITALS — BP 108/58 | HR 72 | Wt 226.0 lb

## 2020-04-06 DIAGNOSIS — N914 Secondary oligomenorrhea: Secondary | ICD-10-CM

## 2020-04-06 DIAGNOSIS — R7303 Prediabetes: Secondary | ICD-10-CM | POA: Diagnosis not present

## 2020-04-06 MED ORDER — METFORMIN HCL 500 MG PO TABS: 500.0000 mg | ORAL_TABLET | Freq: Two times a day (BID) | ORAL | 3 refills | Status: DC

## 2020-04-06 NOTE — Progress Notes (Signed)
   Subjective:    Patient ID: Melanie Cruz, female    DOB: 12-07-1995, 25 y.o.   MRN: [id number]  HPI  Seen for follow up of oligomenorrhea. She has been taking and tolerating the metformin. At first, made her sleepy, but okay now. She has had two light cycles - 5 days of really light flow - with an interval about 28-30 days. Not currently using hormonal contraception.  Has lost about 3 pounds.  Review of Systems     Objective:   Physical Exam Vitals and nursing note reviewed.  Cardiovascular:     Rate and Rhythm: Normal rate and regular rhythm.  Pulmonary:     Effort: Pulmonary effort is normal.     Breath sounds: Normal breath sounds.  Abdominal:     General: Abdomen is flat.     Palpations: Abdomen is soft.  Skin:    Capillary Refill: Capillary refill takes less than 2 seconds.  Neurological:     General: No focal deficit present.  Psychiatric:        Mood and Affect: Mood normal.        Behavior: Behavior normal.        Thought Content: Thought content normal.        Judgment: Judgment normal.        Assessment & Plan:  1. Secondary oligomenorrhea Continue metformin. Discussed continued weight loss, exercise.  2. Prediabetes Metformin will continue to help prevent conversion to diabetes.  Check HgA1c in 6 months.

## 2020-04-06 NOTE — Progress Notes (Signed)
Patient following up on medication she was given at last visit- "diabetes medication". Kathrene Alu RN

## 2020-04-08 ENCOUNTER — Other Ambulatory Visit (HOSPITAL_BASED_OUTPATIENT_CLINIC_OR_DEPARTMENT_OTHER): Payer: Self-pay

## 2020-04-08 MED FILL — Metformin HCl Tab 500 MG: ORAL | 30 days supply | Qty: 60 | Fill #0 | Status: CN

## 2020-04-17 ENCOUNTER — Other Ambulatory Visit (HOSPITAL_BASED_OUTPATIENT_CLINIC_OR_DEPARTMENT_OTHER): Payer: Self-pay

## 2020-04-25 ENCOUNTER — Other Ambulatory Visit (HOSPITAL_BASED_OUTPATIENT_CLINIC_OR_DEPARTMENT_OTHER): Payer: Self-pay

## 2020-05-08 ENCOUNTER — Telehealth: Payer: Self-pay

## 2020-05-08 ENCOUNTER — Other Ambulatory Visit: Payer: Self-pay

## 2020-05-08 NOTE — Telephone Encounter (Signed)
After hours call:  Reason for Call Symptomatic / Request for Health Information Initial Comment: Caller states it feels like she has booger that is stuck causing irritation   Patient has appointment tomorrow with Melanie Cruz.

## 2020-05-09 ENCOUNTER — Ambulatory Visit (INDEPENDENT_AMBULATORY_CARE_PROVIDER_SITE_OTHER): Payer: BC Managed Care – PPO | Admitting: Medical

## 2020-05-09 ENCOUNTER — Encounter: Payer: Self-pay | Admitting: Medical

## 2020-05-09 ENCOUNTER — Encounter: Payer: BC Managed Care – PPO | Admitting: Medical

## 2020-05-09 ENCOUNTER — Other Ambulatory Visit (HOSPITAL_BASED_OUTPATIENT_CLINIC_OR_DEPARTMENT_OTHER): Payer: Self-pay

## 2020-05-09 VITALS — BP 114/81 | HR 100 | Ht 64.0 in | Wt 221.0 lb

## 2020-05-09 DIAGNOSIS — J3489 Other specified disorders of nose and nasal sinuses: Secondary | ICD-10-CM

## 2020-05-09 DIAGNOSIS — Z0000 Encounter for general adult medical examination without abnormal findings: Secondary | ICD-10-CM | POA: Diagnosis not present

## 2020-05-09 DIAGNOSIS — F17200 Nicotine dependence, unspecified, uncomplicated: Secondary | ICD-10-CM | POA: Diagnosis not present

## 2020-05-09 DIAGNOSIS — J339 Nasal polyp, unspecified: Secondary | ICD-10-CM

## 2020-05-09 DIAGNOSIS — R739 Hyperglycemia, unspecified: Secondary | ICD-10-CM | POA: Diagnosis not present

## 2020-05-09 LAB — COMPREHENSIVE METABOLIC PANEL
ALT: 25 U/L (ref 0–35)
AST: 15 U/L (ref 0–37)
Albumin: 4.3 g/dL (ref 3.5–5.2)
Alkaline Phosphatase: 45 U/L (ref 39–117)
BUN: 8 mg/dL (ref 6–23)
CO2: 24 mEq/L (ref 19–32)
Calcium: 9 mg/dL (ref 8.4–10.5)
Chloride: 105 mEq/L (ref 96–112)
Creatinine, Ser: 0.61 mg/dL (ref 0.40–1.20)
GFR: 122.71 mL/min (ref >60.00)
Glucose, Bld: 89 mg/dL (ref 70–99)
Potassium: 3.9 mEq/L (ref 3.5–5.1)
Sodium: 137 mEq/L (ref 135–145)
Total Bilirubin: 0.3 mg/dL (ref 0.2–1.2)
Total Protein: 7.2 g/dL (ref 6.0–8.3)

## 2020-05-09 LAB — CBC WITH DIFFERENTIAL/PLATELET
Basophils Absolute: 0.1 10*3/uL (ref 0.0–0.1)
Basophils Relative: 1.2 % (ref 0.0–3.0)
Eosinophils Absolute: 0.1 10*3/uL (ref 0.0–0.7)
Eosinophils Relative: 1.4 % (ref 0.0–5.0)
HCT: 37.1 % (ref 36.0–46.0)
Hemoglobin: 12 g/dL (ref 12.0–15.0)
Lymphocytes Relative: 39.2 % (ref 12.0–46.0)
Lymphs Abs: 2.5 10*3/uL (ref 0.7–4.0)
MCHC: 32.3 g/dL (ref 30.0–36.0)
MCV: 78.8 fl (ref 78.0–100.0)
Monocytes Absolute: 0.5 10*3/uL (ref 0.1–1.0)
Monocytes Relative: 8 % (ref 3.0–12.0)
Neutro Abs: 3.2 10*3/uL (ref 1.4–7.7)
Neutrophils Relative %: 50.2 % (ref 43.0–77.0)
Platelets: 210 10*3/uL (ref 150.0–400.0)
RBC: 4.71 Mil/uL (ref 3.87–5.11)
RDW: 15.4 % (ref 11.5–15.5)
WBC: 6.3 10*3/uL (ref 4.0–10.5)

## 2020-05-09 LAB — LIPID PANEL
Cholesterol: 196 mg/dL (ref 0–200)
HDL: 40.1 mg/dL (ref >39.00)
LDL Cholesterol: 131 mg/dL — ABNORMAL HIGH (ref 0–99)
NonHDL: 155.64
Total CHOL/HDL Ratio: 5
Triglycerides: 122 mg/dL (ref 0.0–149.0)
VLDL: 24.4 mg/dL (ref 0.0–40.0)

## 2020-05-09 MED ORDER — FLUTICASONE PROPIONATE 50 MCG/ACT NA SUSP
2.0000 | Freq: Every day | NASAL | 1 refills | Status: DC
  Filled 2020-05-09: qty 16, 30d supply, fill #0

## 2020-05-09 MED ORDER — AZITHROMYCIN 250 MG PO TABS
ORAL_TABLET | ORAL | 0 refills | Status: AC
  Filled 2020-05-09: qty 6, 5d supply, fill #0

## 2020-05-09 NOTE — Progress Notes (Signed)
Subjective:    Patient ID: Melanie Cruz, female    DOB: 1995-12-18, 25 y.o.   MRN: [id number]  HPI Pt in for wellness exam  Pt works from home [employer]. Pt not exercising. Uses alcohol about 2 beers 3 days a week. Smokes less than 1/2 pack a day. Smoked for past 10 years.  Pt also mentions has post nasal drainage recently  with some blood tinged mucus when blows nose. Now having sensation of something stuck in rt maxillary sinus area.  Pt called after hours on weekend and got advise.  No fever, no chills or sweats. Mild sneezing recently. Pt states pnd present for a while/months.   Pt also states her gyn dx her with prediabetic. Pt is on metformin. Pt states random sugars are little high. Also pt last a1c was 5.8.   Pt is trying to quite smoking.   LMP- April 17,2022.   Pt declines covid vaccine.  Review of Systems  Constitutional: Negative for chills, fatigue and fever.  Respiratory: Negative for cough, chest tightness, shortness of breath and wheezing.   Cardiovascular: Negative for chest pain and palpitations.  Gastrointestinal: Negative for abdominal pain, constipation and nausea.  Musculoskeletal: Negative for back pain.  Skin: Negative for rash.  Neurological: Negative for dizziness, weakness, numbness and headaches.  Hematological: Negative for adenopathy. Does not bruise/bleed easily.  Psychiatric/Behavioral: Negative for behavioral problems, decreased concentration and suicidal ideas. The patient is not nervous/anxious.     Past Medical History:  Diagnosis Date  . COVID-19   . Migraine      Social History   Socioeconomic History  . Marital status: Single    Spouse name: Not on file  . Number of children: Not on file  . Years of education: Not on file  . Highest education level: Not on file  Occupational History  . Not on file  Tobacco Use  . Smoking status: Current Every Day Smoker    Packs/day: 0.50    Years: 5.00    Pack years: 2.50     Types: Cigarettes  . Smokeless tobacco: Never Used  Vaping Use  . Vaping Use: Former  Substance and Sexual Activity  . Alcohol use: Yes    Comment: occasionally   . Drug use: Not Currently    Types: Marijuana    Comment: occasional-last use 05/23/2017  . Sexual activity: Yes    Birth control/protection: Inserts    Comment: nuvaring   Other Topics Concern  . Not on file  Social History Narrative   Lives with roommate   Right handed   Caffeine: 0-1 cup/day   Social Determinants of Health   Financial Resource Strain: Not on file  Food Insecurity: Not on file  Transportation Needs: Not on file  Physical Activity: Not on file  Stress: Not on file  Social Connections: Not on file  Intimate Partner Violence: Not on file    Past Surgical History:  Procedure Laterality Date  . NO PAST SURGERIES      Family History  Problem Relation Age of Onset  . Hypertension Mother   . Diabetes Mother   . Breast cancer Paternal Aunt        age at onset 30's  . Migraines Neg Hx     No Known Allergies  Current Outpatient Medications on File Prior to Visit  Medication Sig Dispense Refill  . busPIRone (BUSPAR) 7.5 MG tablet Take 1 tablet (7.5 mg total) by mouth 2 (two) times daily. 60 tablet  3  . diclofenac (VOLTAREN) 75 MG EC tablet 1 tab po bid prn headache. 20 tablet 0  . etonogestrel-ethinyl estradiol (NUVARING) 0.12-0.015 MG/24HR vaginal ring Insert vaginally and leave in place for 3 consecutive weeks, then remove for 1 week. 3 each 3  . fluticasone (FLONASE) 50 MCG/ACT nasal spray Place 2 sprays into both nostrils daily. 16 g 1  . metFORMIN (GLUCOPHAGE) 500 MG tablet TAKE 1 TABLET (500 MG TOTAL) BY MOUTH 2 (TWO) TIMES DAILY WITH A MEAL. 180 tablet 3  . naproxen (NAPROSYN) 500 MG tablet Take 1 tablet (500 mg total) by mouth 2 (two) times daily. 30 tablet 0  . nicotine (NICOTROL) 10 MG inhaler Inhale 1 Cartridge (1 continuous puffing total) into the lungs as needed for smoking  cessation. 42 each 0  . pantoprazole (PROTONIX) 40 MG tablet TAKE 1 TABLET (40 MG TOTAL) BY MOUTH DAILY. 30 tablet 1  . rizatriptan (MAXALT-MLT) 10 MG disintegrating tablet Take 1 tablet (10 mg total) by mouth as needed for migraine. May repeat in 2 hours if needed 9 tablet 11  . SUMAtriptan (IMITREX) 50 MG tablet 1 tab po onset of ha. Repeat in 2 hours if needed. 10 tablet 0   No current facility-administered medications on file prior to visit.    BP 114/81   Pulse 100   Ht 5\' 4"  (1.626 m)   Wt 221 lb (100.2 kg)   LMP 04/23/2020   SpO2 97%   BMI 37.93 kg/m   .     Objective:   Physical Exam  General Mental Status- Alert. General Appearance- Not in acute distress.   Skin General: Color- Normal Color. Moisture- Normal Moisture.  Neck Carotid Arteries- Normal color. Moisture- Normal Moisture. No carotid bruits. No JVD.  Chest and Lung Exam Auscultation: Breath Sounds:-Normal.  Cardiovascular Auscultation:Rythm- Regular. Murmurs & Other Heart Sounds:Auscultation of the heart reveals- No Murmurs.  Abdomen Inspection:-Inspeection Normal. Palpation/Percussion:Note:No mass. Palpation and Percussion of the abdomen reveal- Non Tender, Non Distended + BS, no rebound or guarding.   Neurologic Cranial Nerve exam:- CN III-XII intact(No nystagmus), symmetric smile. Strength:- 5/5 equal and symmetric strength both upper and lower extremities.  HEENT-no frontal sinus tenderness to palpation.  Left-sided maxillary sinuses nontender.  Both turbinates boggy and right side nostril shows moderate to large probable nasal polyp.  Right maxillary sinus is tender to palpation.     Assessment & Plan:  For you wellness exam today I have ordered cbc, cmp and  lipid panel.  Vaccine up to date but declined covid vaccine.   Recommend exercise and healthy diet.  We will let you know lab results as they come in.  Follow up date appointment will be determined after lab review.  We  discussed your history of sugar elevation in A1c that indicates prediabetes.  Recommend low sugar diet, get regular exercise and consider diet such as weight watchers.  Also discussed checking occasional blood sugar postmeal will get an idea of how high your blood sugars increased after various type meals.  History of smoking and encouraged to quit smoking.  Recommend that you try Wellbutrin/bupropion which you have at home.  Rx advisement given.  Recent nasal congestion, right side sinus pressure and on exam probable nasal polyp.  Prescribed azithromycin antibiotic and Flonase nasal spray.  Also placed referral to ENT.  Follow-up date to be determined after lab review.  [id number] charges were all.  Did address smoking cessation, maxillary sinus pressure, nasal polyp and elevated sugar/prediabetes.

## 2020-05-09 NOTE — Patient Instructions (Addendum)
For you wellness exam today I have ordered cbc, cmp and  lipid panel.  Vaccine up to date but declined covid vaccine.   Recommend exercise and healthy diet.  We will let you know lab results as they come in.  Follow up date appointment will be determined after lab review.  We discussed your history of sugar elevation in A1c that indicates prediabetes.  Recommend low sugar diet, get regular exercise and consider diet such as weight watchers.  Also discussed checking occasional blood sugar postmeal will get an idea of how high your blood sugars increased after various type meals.  History of smoking and encouraged to quit smoking.  Recommend that you try Wellbutrin/bupropion which you have at home.  Rx advisement given.  Recent nasal congestion, right side sinus pressure and on exam probable nasal polyp.  Prescribed azithromycin antibiotic and Flonase nasal spray.  Also placed referral to ENT.  Follow-up date to be determined after lab review.   Preventive Care 27-2 Years Old, Female Preventive care refers to lifestyle choices and visits with your health care provider that can promote health and wellness. This includes:  A yearly physical exam. This is also called an annual wellness visit.  Regular dental and eye exams.  Immunizations.  Screening for certain conditions.  Healthy lifestyle choices, such as: ? Eating a healthy diet. ? Getting regular exercise. ? Not using drugs or products that contain nicotine and tobacco. ? Limiting alcohol use. What can I expect for my preventive care visit? Physical exam Your health care provider may check your:  Height and weight. These may be used to calculate your BMI (body mass index). BMI is a measurement that tells if you are at a healthy weight.  Heart rate and blood pressure.  Body temperature.  Skin for abnormal spots. Counseling Your health care provider may ask you questions about your:  Past medical problems.  Family's  medical history.  Alcohol, tobacco, and drug use.  Emotional well-being.  Home life and relationship well-being.  Sexual activity.  Diet, exercise, and sleep habits.  Work and work Statistician.  Access to firearms.  Method of birth control.  Menstrual cycle.  Pregnancy history. What immunizations do I need? Vaccines are usually given at various ages, according to a schedule. Your health care provider will recommend vaccines for you based on your age, medical history, and lifestyle or other factors, such as travel or where you work.   What tests do I need? Blood tests  Lipid and cholesterol levels. These may be checked every 5 years starting at age 27.  Hepatitis C test.  Hepatitis B test. Screening  Diabetes screening. This is done by checking your blood sugar (glucose) after you have not eaten for a while (fasting).  STD (sexually transmitted disease) testing, if you are at risk.  BRCA-related cancer screening. This may be done if you have a family history of breast, ovarian, tubal, or peritoneal cancers.  Pelvic exam and Pap test. This may be done every 3 years starting at age 20. Starting at age 6, this may be done every 5 years if you have a Pap test in combination with an HPV test. Talk with your health care provider about your test results, treatment options, and if necessary, the need for more tests.   Follow these instructions at home: Eating and drinking  Eat a healthy diet that includes fresh fruits and vegetables, whole grains, lean protein, and low-fat dairy products.  Take vitamin and mineral supplements as  recommended by your health care provider.  Do not drink alcohol if: ? Your health care provider tells you not to drink. ? You are pregnant, may be pregnant, or are planning to become pregnant.  If you drink alcohol: ? Limit how much you have to 0-1 drink a day. ? Be aware of how much alcohol is in your drink. In the [place] one drink equals one 12  oz bottle of beer (355 mL), one 5 oz glass of wine (148 mL), or one 1 oz glass of hard liquor (44 mL).   Lifestyle  Take daily care of your teeth and gums. Brush your teeth every morning and night with fluoride toothpaste. Floss one time each day.  Stay active. Exercise for at least 30 minutes 5 or more days each week.  Do not use any products that contain nicotine or tobacco, such as cigarettes, e-cigarettes, and chewing tobacco. If you need help quitting, ask your health care provider.  Do not use drugs.  If you are sexually active, practice safe sex. Use a condom or other form of protection to prevent STIs (sexually transmitted infections).  If you do not wish to become pregnant, use a form of birth control. If you plan to become pregnant, see your health care provider for a prepregnancy visit.  Find healthy ways to cope with stress, such as: ? Meditation, yoga, or listening to music. ? Journaling. ? Talking to a trusted person. ? Spending time with friends and family. Safety  Always wear your seat belt while driving or riding in a vehicle.  Do not drive: ? If you have been drinking alcohol. Do not ride with someone who has been drinking. ? When you are tired or distracted. ? While texting.  Wear a helmet and other protective equipment during sports activities.  If you have firearms in your house, make sure you follow all gun safety procedures.  Seek help if you have been physically or sexually abused. What's next?  Go to your health care provider once a year for an annual wellness visit.  Ask your health care provider how often you should have your eyes and teeth checked.  Stay up to date on all vaccines. This information is not intended to replace advice given to you by your health care provider. Make sure you discuss any questions you have with your health care provider. Document Revised: 08/22/2019 Document Reviewed: 09/04/2017 Elsevier Patient Education  2021  [employer].

## 2020-05-09 NOTE — Addendum Note (Signed)
Addended by: Anabel Halon on: 05/09/2020 11:55 AM   Modules accepted: Orders

## 2020-05-10 ENCOUNTER — Ambulatory Visit: Payer: BC Managed Care – PPO

## 2020-05-10 ENCOUNTER — Other Ambulatory Visit: Payer: Self-pay

## 2020-05-10 ENCOUNTER — Other Ambulatory Visit (HOSPITAL_COMMUNITY)
Admission: RE | Admit: 2020-05-10 | Discharge: 2020-05-10 | Disposition: A | Discharge status: Home / Self Care | Payer: BC Managed Care – PPO | Source: Ambulatory Visit | Attending: Family Medicine | Admitting: Family Medicine

## 2020-05-10 VITALS — BP 113/67 | HR 92 | Wt 221.0 lb

## 2020-05-10 DIAGNOSIS — Z113 Encounter for screening for infections with a predominantly sexual mode of transmission: Secondary | ICD-10-CM | POA: Diagnosis not present

## 2020-05-10 DIAGNOSIS — N898 Other specified noninflammatory disorders of vagina: Secondary | ICD-10-CM | POA: Diagnosis not present

## 2020-05-10 NOTE — Progress Notes (Signed)
SUBJECTIVE:  25 y.o. female complains of foul vaginal discharge for 1 month(s). Denies abnormal vaginal bleeding or significant pelvic pain or fever. No UTI symptoms. Denies history of known exposure to STD.  Patient's last menstrual period was 04/23/2020.  OBJECTIVE:  She appears well, afebrile. Urine dipstick: not done.  ASSESSMENT:  Vaginal Discharge  Vaginal Odor   PLAN:  GC, chlamydia, trichomonas, BVAG, CVAG probe sent to lab. Treatment: To be determined once lab results are received ROV prn if symptoms persist or worsen.

## 2020-05-11 ENCOUNTER — Encounter: Payer: Self-pay | Admitting: Medical

## 2020-05-11 LAB — HEPATITIS C ANTIBODY: Hep C Virus Ab: <0.1 s/co ratio (ref 0.0–0.9)

## 2020-05-11 LAB — CERVICOVAGINAL ANCILLARY ONLY
Bacterial Vaginitis (gardnerella): POSITIVE — AB
Candida Glabrata: NEGATIVE
Candida Vaginitis: NEGATIVE
Chlamydia: NEGATIVE
Comment: NEGATIVE
Comment: NEGATIVE
Comment: NEGATIVE
Comment: NEGATIVE
Comment: NEGATIVE
Comment: NORMAL
Neisseria Gonorrhea: NEGATIVE
Trichomonas: NEGATIVE

## 2020-05-11 LAB — RPR: RPR Ser Ql: NONREACTIVE

## 2020-05-11 LAB — HEPATITIS B SURFACE ANTIGEN: Hepatitis B Surface Ag: NEGATIVE

## 2020-05-11 LAB — HIV ANTIBODY (ROUTINE TESTING W REFLEX): HIV Screen 4th Generation wRfx: NONREACTIVE

## 2020-05-12 ENCOUNTER — Other Ambulatory Visit: Payer: Self-pay

## 2020-05-12 MED ORDER — METRONIDAZOLE 500 MG PO TABS: 500.0000 mg | ORAL_TABLET | Freq: Two times a day (BID) | ORAL | 0 refills | Status: DC

## 2020-05-15 MED ORDER — METRONIDAZOLE 500 MG PO TABS: 500.0000 mg | ORAL_TABLET | Freq: Two times a day (BID) | ORAL | 0 refills | Status: DC

## 2020-05-17 ENCOUNTER — Encounter: Payer: Self-pay | Admitting: Medical

## 2020-05-24 ENCOUNTER — Ambulatory Visit: Payer: BC Managed Care – PPO | Admitting: Family Medicine

## 2020-05-24 DIAGNOSIS — R590 Localized enlarged lymph nodes: Secondary | ICD-10-CM | POA: Diagnosis not present

## 2020-05-24 DIAGNOSIS — F419 Anxiety disorder, unspecified: Secondary | ICD-10-CM | POA: Diagnosis not present

## 2020-05-24 DIAGNOSIS — E119 Type 2 diabetes mellitus without complications: Secondary | ICD-10-CM | POA: Diagnosis not present

## 2020-05-24 DIAGNOSIS — E669 Obesity, unspecified: Secondary | ICD-10-CM | POA: Diagnosis not present

## 2020-05-26 ENCOUNTER — Ambulatory Visit: Payer: BC Managed Care – PPO | Admitting: Medical

## 2020-05-26 ENCOUNTER — Encounter: Payer: Self-pay | Admitting: Medical

## 2020-05-30 ENCOUNTER — Encounter: Payer: Self-pay | Admitting: Medical

## 2020-05-30 ENCOUNTER — Telehealth (INDEPENDENT_AMBULATORY_CARE_PROVIDER_SITE_OTHER): Payer: BC Managed Care – PPO | Admitting: Family Medicine

## 2020-05-30 DIAGNOSIS — R070 Pain in throat: Secondary | ICD-10-CM

## 2020-05-30 DIAGNOSIS — M549 Dorsalgia, unspecified: Secondary | ICD-10-CM | POA: Diagnosis not present

## 2020-05-30 DIAGNOSIS — M542 Cervicalgia: Secondary | ICD-10-CM | POA: Diagnosis not present

## 2020-05-30 DIAGNOSIS — L989 Disorder of the skin and subcutaneous tissue, unspecified: Secondary | ICD-10-CM

## 2020-05-30 NOTE — Patient Instructions (Signed)
-  we added the drug to your allergy list.  -please try a different pillow, proper sleep posture, heat, topical menthol sports cream as we discussed, but if the neck/back issues are worsening, do not resolve over the next few days or you have new symptoms schedule and inperson evaluation.  -apply a topical antibiotic ointment twice daily for 3 days to the skin lesion on the face. Seek inperson evaluation if worsening, new lesions, new symptoms or not resolved.   I hope you are feeling better soon!  Seek in person care promptly if your symptoms worsen, new concerns arise or you are not improving with treatment.  It was nice to meet you today. I help  out with telemedicine visits on Tuesdays and Thursdays and am available for visits on those days. If you have any concerns or questions following this visit please schedule a follow up visit with your Primary Care doctor or seek care at a local urgent care clinic to avoid delays in care.

## 2020-05-30 NOTE — Progress Notes (Signed)
Virtual Visit via Video Note  I connected with Melanie Cruz  on 05/30/20 at  6:20 PM EDT by a video enabled telemedicine application and verified that I am speaking with the correct person using two identifiers.  Location patient: home, [place] Location provider:work or home office Persons participating in the virtual visit: patient, provider  I discussed the limitations of evaluation and management by telemedicine and the availability of in person appointments. The patient expressed understanding and agreed to proceed.   HPI:  Acute telemedicine visit for several issues:  1) ? Reaction to a medication: -she had an insect bite on neck recently and went to an urgent care where she was given dexamethasone and cefdinir -she thinks the cefdinir may have caused an allergic reaction as reports her throat felt "funny" after taking it -has completed course and insect bite has resolved, throat is ok now -denies hives, SOB, skin rash (except where noted below), NVD, difficulty swallowing, swelling of face or throat -is getting over a cold  2) neck discomfort: -started after getting a new pillow -some sore ness in neck and upper back muscles mild -no fevers, malaise, CP, SOB, weakness, numbness, radiation -is getting over a cold  3)skin lesion on face: -started today she thinks -she was on a trampoline and it looked like a scrape, but she doesn't remember scraping it ROS: See pertinent positives and negatives per HPI.  Past Medical History:  Diagnosis Date  . COVID-19   . Migraine     Past Surgical History:  Procedure Laterality Date  . NO PAST SURGERIES       Current Outpatient Medications:  .  busPIRone (BUSPAR) 7.5 MG tablet, Take 1 tablet (7.5 mg total) by mouth 2 (two) times daily., Disp: 60 tablet, Rfl: 3 .  diclofenac (VOLTAREN) 75 MG EC tablet, 1 tab po bid prn headache., Disp: 20 tablet, Rfl: 0 .  etonogestrel-ethinyl estradiol (NUVARING) 0.12-0.015 MG/24HR vaginal ring, Insert  vaginally and leave in place for 3 consecutive weeks, then remove for 1 week., Disp: 3 each, Rfl: 3 .  fluticasone (FLONASE) 50 MCG/ACT nasal spray, Place 2 sprays into both nostrils daily. (Patient not taking: Reported on 05/10/2020), Disp: 16 g, Rfl: 1 .  fluticasone (FLONASE) 50 MCG/ACT nasal spray, Place 2 sprays into both nostrils daily., Disp: 16 g, Rfl: 1 .  metFORMIN (GLUCOPHAGE) 500 MG tablet, TAKE 1 TABLET (500 MG TOTAL) BY MOUTH 2 (TWO) TIMES DAILY WITH A MEAL., Disp: 180 tablet, Rfl: 3 .  metroNIDAZOLE (FLAGYL) 500 MG tablet, Take 1 tablet (500 mg total) by mouth 2 (two) times daily., Disp: 14 tablet, Rfl: 0 .  metroNIDAZOLE (FLAGYL) 500 MG tablet, Take 1 tablet (500 mg total) by mouth 2 (two) times daily., Disp: 14 tablet, Rfl: 0 .  naproxen (NAPROSYN) 500 MG tablet, Take 1 tablet (500 mg total) by mouth 2 (two) times daily., Disp: 30 tablet, Rfl: 0 .  nicotine (NICOTROL) 10 MG inhaler, Inhale 1 Cartridge (1 continuous puffing total) into the lungs as needed for smoking cessation. (Patient not taking: Reported on 05/10/2020), Disp: 42 each, Rfl: 0 .  pantoprazole (PROTONIX) 40 MG tablet, TAKE 1 TABLET (40 MG TOTAL) BY MOUTH DAILY. (Patient not taking: Reported on 05/10/2020), Disp: 30 tablet, Rfl: 1 .  rizatriptan (MAXALT-MLT) 10 MG disintegrating tablet, Take 1 tablet (10 mg total) by mouth as needed for migraine. May repeat in 2 hours if needed (Patient not taking: Reported on 05/10/2020), Disp: 9 tablet, Rfl: 11 .  SUMAtriptan (IMITREX) 50  MG tablet, 1 tab po onset of ha. Repeat in 2 hours if needed. (Patient not taking: Reported on 05/10/2020), Disp: 10 tablet, Rfl: 0  EXAM:  VITALS per patient if applicable:  GENERAL: alert, oriented, appears well and in no acute distress  HEENT: atraumatic, conjunttiva clear, no obvious abnormalities on inspection of external nose and ears  NECK: normal movements of the head and neck  LUNGS: on inspection no signs of respiratory distress, breathing  rate appears normal, no obvious gross SOB, gasping or wheezing  CV: no obvious cyanosis  SKIN: abrasion like lesion on L cheek - video quality is poor so good inspection not possible  MS: moves all visible extremities without noticeable abnormality  PSYCH/NEURO: pleasant and cooperative, no obvious depression or anxiety, speech and thought processing grossly intact  ASSESSMENT AND PLAN:  Discussed the following assessment and plan: -we discussed possible serious and likely etiologies, options for evaluation and workup, limitations of telemedicine visit vs in person visit, treatment, treatment risks and precautions for each issue, with plan summarized below.   Throat discomfort -difficult to tell if this was related to a drug reaction or to the recent VURI she reported -she is no longer taking the drug of concern and it seems symptoms have resolved -added her reported symptoms to the drug intol list in her cone chart just in case  -advised prompt inperson evaluation if recurrent or worsening symptoms  Neck pain Other acute back pain -could be postural, viral or related to the new pillow vs other -discussed proper sleep/pillow position, gentle home exercises, otc options for treatment, rice heating pack and advised inperson evaluation immediately if worsening on not resolved with these measures  Skin lesion -did let her know a good exam over video not possible, looks like possible scrape, discussed other possibilities -opted for topical abx ointment but advise inperson evaluation if worsening, new lesions or does not resolve promptly   Scheduled follow up with PCP offered: she opted to follow up as needed Advised to seek prompt in person care if worsening, new symptoms arise, or if is not improving with treatment. Discussed options for inperson care if PCP office not available. Did let this patient know that I only do telemedicine on Tuesdays and Thursdays for [place]. Advised to  schedule follow up visit with PCP or [facility] if any further questions or concerns to avoid delays in care.   I discussed the assessment and treatment plan with the patient. The patient was provided an opportunity to ask questions and all were answered. The patient agreed with the plan and demonstrated an understanding of the instructions.     Lucretia Kern, DO

## 2020-06-13 ENCOUNTER — Encounter: Payer: Self-pay | Admitting: Medical

## 2020-06-15 ENCOUNTER — Encounter: Payer: Self-pay | Admitting: Medical

## 2020-06-15 ENCOUNTER — Ambulatory Visit: Payer: BC Managed Care – PPO | Admitting: Medical

## 2020-06-15 ENCOUNTER — Other Ambulatory Visit: Payer: Self-pay

## 2020-06-15 VITALS — BP 117/70 | HR 90 | Temp 98.5°F | Resp 18 | Ht 64.0 in | Wt 221.0 lb

## 2020-06-15 DIAGNOSIS — E010 Iodine-deficiency related diffuse (endemic) goiter: Secondary | ICD-10-CM

## 2020-06-15 DIAGNOSIS — R591 Generalized enlarged lymph nodes: Secondary | ICD-10-CM

## 2020-06-15 LAB — CBC WITH DIFFERENTIAL/PLATELET
Basophils Absolute: 0.1 10*3/uL (ref 0.0–0.1)
Basophils Relative: 1.1 % (ref 0.0–3.0)
Eosinophils Absolute: 0.1 10*3/uL (ref 0.0–0.7)
Eosinophils Relative: 1.3 % (ref 0.0–5.0)
HCT: 39.2 % (ref 36.0–46.0)
Hemoglobin: 12.5 g/dL (ref 12.0–15.0)
Lymphocytes Relative: 32.8 % (ref 12.0–46.0)
Lymphs Abs: 2.8 10*3/uL (ref 0.7–4.0)
MCHC: 32 g/dL (ref 30.0–36.0)
MCV: 79.7 fl (ref 78.0–100.0)
Monocytes Absolute: 0.6 10*3/uL (ref 0.1–1.0)
Monocytes Relative: 7.2 % (ref 3.0–12.0)
Neutro Abs: 4.9 10*3/uL (ref 1.4–7.7)
Neutrophils Relative %: 57.6 % (ref 43.0–77.0)
Platelets: 208 10*3/uL (ref 150.0–400.0)
RBC: 4.92 Mil/uL (ref 3.87–5.11)
RDW: 15.6 % — ABNORMAL HIGH (ref 11.5–15.5)
WBC: 8.5 10*3/uL (ref 4.0–10.5)

## 2020-06-15 LAB — TSH: TSH: 1.6 u[IU]/mL (ref 0.35–4.50)

## 2020-06-15 MED ORDER — SULFAMETHOXAZOLE-TRIMETHOPRIM 800-160 MG PO TABS: 1.0000 | ORAL_TABLET | Freq: Two times a day (BID) | ORAL | 0 refills | Status: DC

## 2020-06-15 NOTE — Progress Notes (Signed)
Subjective:    Patient ID: Melanie Cruz, female    DOB: March 08, 1995, 25 y.o.   MRN: [id number]  HPI  Pt in for some rt side neck pain(feels like lymph node per pt). Pain started about 2 weeks ago. Pt states she fell 2-3 times. She states does not want to go into detail as to why she fell?[address] due to drinking heavily) She is unsure of details of fall as she was extremely drunk.  No obvious severe st or any ear pain.  Pt tell me she does not drink heavily on routuine basis. States drinks alcohol heavily rarely.     Pt has been taking tylenol and placing heating pad on the area.   Pt also had virtual visit and mentioned neck pain to provider. She states correlated to new pillow. Pt thinks pain may have been in same area but she is not sure.  Lmp- 05-26-2020. Has nuvaring. Has not used.    Review of Systems  Constitutional:  Negative for chills, fatigue and fever.  HENT:  Negative for congestion.   Respiratory:  Negative for cough, chest tightness, shortness of breath and wheezing.   Cardiovascular:  Negative for chest pain and palpitations.  Gastrointestinal:  Negative for abdominal pain.  Musculoskeletal:  Negative for back pain, myalgias and neck pain.  Skin:  Negative for rash.  Hematological:  Positive for adenopathy.    Past Medical History:  Diagnosis Date   COVID-19    Migraine      Social History   Socioeconomic History   Marital status: Single    Spouse name: Not on file   Number of children: Not on file   Years of education: Not on file   Highest education level: Not on file  Occupational History   Not on file  Tobacco Use   Smoking status: Every Day    Packs/day: 0.50    Years: 5.00    Pack years: 2.50    Types: Cigarettes   Smokeless tobacco: Never  Vaping Use   Vaping Use: Former  Substance and Sexual Activity   Alcohol use: Yes    Comment: occasionally    Drug use: Not Currently    Types: Marijuana    Comment: occasional-last use 05/23/2017    Sexual activity: Yes    Birth control/protection: Inserts    Comment: nuvaring   Other Topics Concern   Not on file  Social History Narrative   Lives with roommate   Right handed   Caffeine: 0-1 cup/day   Social Determinants of Health   Financial Resource Strain: Not on file  Food Insecurity: Not on file  Transportation Needs: Not on file  Physical Activity: Not on file  Stress: Not on file  Social Connections: Not on file  Intimate Partner Violence: Not on file    Past Surgical History:  Procedure Laterality Date   NO PAST SURGERIES      Family History  Problem Relation Age of Onset   Hypertension Mother    Diabetes Mother    Breast cancer Paternal Aunt        age at onset 59's   Migraines Neg Hx     Allergies  Allergen Reactions   Cefdinir Other (See Comments)    Had sensation in throat after taking, was getting over a cold at the time.    Current Outpatient Medications on File Prior to Visit  Medication Sig Dispense Refill   busPIRone (BUSPAR) 7.5 MG tablet Take 1 tablet (7.5 mg  total) by mouth 2 (two) times daily. 60 tablet 3   diclofenac (VOLTAREN) 75 MG EC tablet 1 tab po bid prn headache. 20 tablet 0   etonogestrel-ethinyl estradiol (NUVARING) 0.12-0.015 MG/24HR vaginal ring Insert vaginally and leave in place for 3 consecutive weeks, then remove for 1 week. 3 each 3   fluticasone (FLONASE) 50 MCG/ACT nasal spray Place 2 sprays into both nostrils daily. 16 g 1   metFORMIN (GLUCOPHAGE) 500 MG tablet TAKE 1 TABLET (500 MG TOTAL) BY MOUTH 2 (TWO) TIMES DAILY WITH A MEAL. 180 tablet 3   metroNIDAZOLE (FLAGYL) 500 MG tablet Take 1 tablet (500 mg total) by mouth 2 (two) times daily. 14 tablet 0   naproxen (NAPROSYN) 500 MG tablet Take 1 tablet (500 mg total) by mouth 2 (two) times daily. 30 tablet 0   No current facility-administered medications on file prior to visit.    BP 117/70   Pulse 90   Temp 98.5 F (36.9 C)   Resp 18   Ht 5\' 4"  (1.626 m)   Wt  221 lb (100.2 kg)   SpO2 99%   BMI 37.93 kg/m       Objective:   Physical Exam  General- No acute distress. Pleasant patient. Neck- Full range of motion, no jvd. Appear to have enlarged thyroid. Rt side posterior cervical  lymph node enlarged moderate. Small left sided posterior  cervical lymph node. Lungs- Clear, even and unlabored. Heart- regular rate and rhythm. Neurologic- CNII- XII grossly intact.        Assessment & Plan:  You do appear to have enlarged thyroid and 2 palplable posterior cervical lymph nodes. No obvious regional infection . Will get cbc, tsh, t4 and ultrasound of neck area. Will discuss with radiology to see if [place] order can be combined to evaluate both areas.   Rx bactrim ds twice daily.  Follow studies and recheck in 10 days.  May need to sent to specialist depending on how you do clinically as well as depending on study results.  Follow up 10 days or as needed  [employer], [employer]

## 2020-06-15 NOTE — Patient Instructions (Signed)
You do appear to have enlarged thyroid and 2 palplable posterior cervical lymph nodes. No obvious regional infection . Will get cbc, tsh, t4 and ultrasound of neck area. Will discuss with radiology to see if [place] order can be combined to evaluate both areas.   Rx bactrim ds twice daily.  Follow studies and recheck in 10 days.  May need to sent to specialist depnding on how you do clinically as well as depending on study results.  Follow up 10 days or as needed

## 2020-06-23 ENCOUNTER — Encounter: Payer: Self-pay | Admitting: Medical

## 2020-06-23 ENCOUNTER — Ambulatory Visit (HOSPITAL_BASED_OUTPATIENT_CLINIC_OR_DEPARTMENT_OTHER)
Admission: RE | Admit: 2020-06-23 | Discharge: 2020-06-23 | Disposition: A | Discharge status: Home / Self Care | Payer: BC Managed Care – PPO | Source: Ambulatory Visit | Attending: Medical | Admitting: Medical

## 2020-06-23 ENCOUNTER — Other Ambulatory Visit: Payer: Self-pay

## 2020-06-23 DIAGNOSIS — R591 Generalized enlarged lymph nodes: Secondary | ICD-10-CM

## 2020-06-23 DIAGNOSIS — E010 Iodine-deficiency related diffuse (endemic) goiter: Secondary | ICD-10-CM | POA: Diagnosis not present

## 2020-06-23 DIAGNOSIS — R221 Localized swelling, mass and lump, neck: Secondary | ICD-10-CM | POA: Diagnosis not present

## 2020-06-27 ENCOUNTER — Other Ambulatory Visit: Payer: Self-pay

## 2020-06-27 ENCOUNTER — Ambulatory Visit: Payer: BC Managed Care – PPO | Admitting: Medical

## 2020-06-27 VITALS — BP 112/65 | HR 68 | Ht 64.0 in | Wt 224.0 lb

## 2020-06-27 DIAGNOSIS — R591 Generalized enlarged lymph nodes: Secondary | ICD-10-CM

## 2020-06-27 DIAGNOSIS — N926 Irregular menstruation, unspecified: Secondary | ICD-10-CM

## 2020-06-27 LAB — POCT URINE PREGNANCY: Preg Test, Ur: NEGATIVE

## 2020-06-27 NOTE — Progress Notes (Signed)
Subjective:    Patient ID: Melanie Cruz, female    DOB: 02-Feb-1995, 25 y.o.   MRN: [id number]  HPI  Pt in for follow up.  Pt had recent US of thyroid and soft tissue neck. Pt tsh was normal and wbc were normal.  [place] report soft tissue neck  is most likely a benign lymph node per radiologist.   [place] of thyroid showed no suspicious thyroid nodules.  Pt is on metformin for prediabetes.  Below A/P from gyn.   1. Secondary oligomenorrhea Continue metformin. Discussed continued weight loss, exercise.   2. Prediabetes Metformin will continue to help prevent conversion to diabetes. Check HgA1c in 6 months  Pt lmp 05/21/2020. Pt is not on nuva ring or her metformin. Pt has one child.  Hx of irregur cycles. Normalized with metformin per pt.   Pt wants std screening. Will do with gynecologist Thursday.    Review of Systems  Constitutional:  Negative for chills, fatigue and fever.  HENT:  Negative for dental problem.   Respiratory:  Negative for cough, chest tightness, shortness of breath and wheezing.   Cardiovascular:  Negative for chest pain and palpitations.  Gastrointestinal:  Negative for abdominal pain, blood in stool and constipation.  Genitourinary:  Negative for difficulty urinating, dysuria, flank pain, frequency, hematuria, pelvic pain and urgency.  Musculoskeletal:  Negative for back pain.  Neurological:  Negative for dizziness, light-headedness and numbness.  Hematological:  Negative for adenopathy. Does not bruise/bleed easily.  Psychiatric/Behavioral:  Negative for behavioral problems, decreased concentration, dysphoric mood and sleep disturbance. The patient is not nervous/anxious.      Past Medical History:  Diagnosis Date   COVID-19    Migraine      Social History   Socioeconomic History   Marital status: Single    Spouse name: Not on file   Number of children: Not on file   Years of education: Not on file   Highest education level: Not on file   Occupational History   Not on file  Tobacco Use   Smoking status: Every Day    Packs/day: 0.50    Years: 5.00    Pack years: 2.50    Types: Cigarettes   Smokeless tobacco: Never  Vaping Use   Vaping Use: Former  Substance and Sexual Activity   Alcohol use: Yes    Comment: occasionally    Drug use: Not Currently    Types: Marijuana    Comment: occasional-last use 05/23/2017   Sexual activity: Yes    Birth control/protection: Inserts    Comment: nuvaring   Other Topics Concern   Not on file  Social History Narrative   Lives with roommate   Right handed   Caffeine: 0-1 cup/day   Social Determinants of Health   Financial Resource Strain: Not on file  Food Insecurity: Not on file  Transportation Needs: Not on file  Physical Activity: Not on file  Stress: Not on file  Social Connections: Not on file  Intimate Partner Violence: Not on file    Past Surgical History:  Procedure Laterality Date   NO PAST SURGERIES      Family History  Problem Relation Age of Onset   Hypertension Mother    Diabetes Mother    Breast cancer Paternal Aunt        age at onset 27's   Migraines Neg Hx     Allergies  Allergen Reactions   Cefdinir Other (See Comments)    Had sensation in throat  after taking, was getting over a cold at the time.    Current Outpatient Medications on File Prior to Visit  Medication Sig Dispense Refill   busPIRone (BUSPAR) 7.5 MG tablet Take 1 tablet (7.5 mg total) by mouth 2 (two) times daily. 60 tablet 3   diclofenac (VOLTAREN) 75 MG EC tablet 1 tab po bid prn headache. 20 tablet 0   etonogestrel-ethinyl estradiol (NUVARING) 0.12-0.015 MG/24HR vaginal ring Insert vaginally and leave in place for 3 consecutive weeks, then remove for 1 week. 3 each 3   fluticasone (FLONASE) 50 MCG/ACT nasal spray Place 2 sprays into both nostrils daily. 16 g 1   metFORMIN (GLUCOPHAGE) 500 MG tablet TAKE 1 TABLET (500 MG TOTAL) BY MOUTH 2 (TWO) TIMES DAILY WITH A MEAL. 180  tablet 3   naproxen (NAPROSYN) 500 MG tablet Take 1 tablet (500 mg total) by mouth 2 (two) times daily. 30 tablet 0   No current facility-administered medications on file prior to visit.    BP 112/65   Pulse 68   Ht 5\' 4"  (1.626 m)   Wt 224 lb (101.6 kg)   SpO2 100%   BMI 38.45 kg/m       Objective:   Physical Exam   General Mental Status- Alert. General Appearance- Not in acute distress.   Skin General: Color- Normal Color. Moisture- Normal Moisture.  Neck Carotid Arteries- Normal color. Moisture- Normal Moisture. No carotid bruits. No JVD. Rt side posterior cervical lymph node moderate in size   Chest and Lung Exam Auscultation: Breath Sounds:-Normal.  Cardiovascular Auscultation:Rythm- Regular. Murmurs & Other Heart Sounds:Auscultation of the heart reveals- No Murmurs.  Abdomen Inspection:-Inspeection Normal. Palpation/Percussion:Note:No mass. Palpation and Percussion of the abdomen reveal- Non Tender, Non Distended + BS, no rebound or guarding.    Neurologic Cranial Nerve exam:- CN III-XII intact(No nystagmus), symmetric smile. Strength:- 5/5 equal and symmetric strength both upper and lower extremities.      Assessment & Plan:  For your likely  moderate sized lymph node reviewed labs and [place] study today. Decided to refer you to ENT for confirmation/opininion based on the size.  For late menses we did urine pregnancy test. Resume metformin and nuvaring if negative.  You mentoned desire for std screening. Recommend you do those at gyn office as they may do direct culture which is more accurate than urine ancillary.  Follow up in 3 months or as needed  [employer], [employer]

## 2020-06-27 NOTE — Patient Instructions (Signed)
For your likely  moderate sized lymph node reviewed labs and [place] study today. Decided to refer you to ENT for confirmation/opininion based on the size.  For late menses we did urine pregnancy test. Resume metformin and nuvaring if negative.  You mentoned desire for std screening. Recommend you do those at gyn office as they may do direct culture which is more accurate than urine ancillary.  Follow up in 3 months or as needed

## 2020-06-29 ENCOUNTER — Ambulatory Visit (INDEPENDENT_AMBULATORY_CARE_PROVIDER_SITE_OTHER): Payer: BC Managed Care – PPO

## 2020-06-29 ENCOUNTER — Other Ambulatory Visit: Payer: Self-pay

## 2020-06-29 ENCOUNTER — Other Ambulatory Visit (HOSPITAL_COMMUNITY)
Admission: RE | Admit: 2020-06-29 | Discharge: 2020-06-29 | Disposition: A | Discharge status: Home / Self Care | Payer: BC Managed Care – PPO | Source: Ambulatory Visit | Attending: Family Medicine | Admitting: Family Medicine

## 2020-06-29 VITALS — BP 109/50 | HR 63 | Wt 223.0 lb

## 2020-06-29 DIAGNOSIS — Z113 Encounter for screening for infections with a predominantly sexual mode of transmission: Secondary | ICD-10-CM | POA: Diagnosis not present

## 2020-06-29 NOTE — Progress Notes (Signed)
Pt presents for STI screening. Pt denies vaginal discharge. Self swab was sent to the lab. Shaleta Ruacho l Emile Kyllo, CMA

## 2020-06-29 NOTE — Progress Notes (Signed)
Chart reviewed - agree with CMA/RN documentation.  ° °

## 2020-06-30 LAB — CERVICOVAGINAL ANCILLARY ONLY
Chlamydia: NEGATIVE
Comment: NEGATIVE
Comment: NEGATIVE
Comment: NORMAL
Neisseria Gonorrhea: NEGATIVE
Trichomonas: NEGATIVE

## 2020-07-07 ENCOUNTER — Encounter: Payer: Self-pay | Admitting: Medical

## 2020-07-25 ENCOUNTER — Ambulatory Visit: Payer: BC Managed Care – PPO

## 2020-07-25 ENCOUNTER — Telehealth: Payer: Self-pay | Admitting: General Practice

## 2020-07-25 NOTE — Telephone Encounter (Signed)
-----   Message from Maurine Minister, [place] sent at 04/12/2020 11:22 AM EDT ----- Regarding: 6 month follow up Return in about 6 months (around 10/06/2020) for f/u PCOS with Dr. Nehemiah Settle.

## 2020-07-25 NOTE — Telephone Encounter (Signed)
Left message for patient to contact our office to schedule follow up visit with Dr. Nehemiah Settle in September.  Pt to f/u for PCOS.

## 2020-07-26 ENCOUNTER — Other Ambulatory Visit: Payer: Self-pay

## 2020-07-26 ENCOUNTER — Other Ambulatory Visit (HOSPITAL_COMMUNITY)
Admission: RE | Admit: 2020-07-26 | Discharge: 2020-07-26 | Disposition: A | Discharge status: Home / Self Care | Payer: BC Managed Care – PPO | Source: Ambulatory Visit | Attending: Obstetrics & Gynecology | Admitting: Obstetrics & Gynecology

## 2020-07-26 ENCOUNTER — Ambulatory Visit (INDEPENDENT_AMBULATORY_CARE_PROVIDER_SITE_OTHER): Payer: BC Managed Care – PPO

## 2020-07-26 VITALS — BP 107/58 | HR 71 | Wt 223.0 lb

## 2020-07-26 DIAGNOSIS — N898 Other specified noninflammatory disorders of vagina: Secondary | ICD-10-CM | POA: Insufficient documentation

## 2020-07-26 DIAGNOSIS — B3731 Acute candidiasis of vulva and vagina: Secondary | ICD-10-CM

## 2020-07-26 DIAGNOSIS — B9689 Other specified bacterial agents as the cause of diseases classified elsewhere: Secondary | ICD-10-CM

## 2020-07-26 DIAGNOSIS — B373 Candidiasis of vulva and vagina: Secondary | ICD-10-CM

## 2020-07-26 NOTE — Progress Notes (Signed)
SUBJECTIVE:  25 y.o. female complains of white vaginal discharge for 3 week(s). Denies abnormal vaginal bleeding or significant pelvic pain or fever. No UTI symptoms. Denies history of known exposure to STD.  Patient's last menstrual period was 05/26/2020 (exact date).  OBJECTIVE:  She appears well, afebrile. Urine dipstick: not done.  ASSESSMENT:  Vaginal Discharge     PLAN:  GC, chlamydia, trichomonas, BVAG, CVAG probe sent to lab. Treatment: To be determined once lab results are received ROV prn if symptoms persist or worsen.

## 2020-07-27 LAB — CERVICOVAGINAL ANCILLARY ONLY
Bacterial Vaginitis (gardnerella): POSITIVE — AB
Candida Glabrata: NEGATIVE
Candida Vaginitis: POSITIVE — AB
Chlamydia: NEGATIVE
Comment: NEGATIVE
Comment: NEGATIVE
Comment: NEGATIVE
Comment: NEGATIVE
Comment: NEGATIVE
Comment: NORMAL
Neisseria Gonorrhea: NEGATIVE
Trichomonas: NEGATIVE

## 2020-07-27 MED ORDER — FLUCONAZOLE 150 MG PO TABS: 150.0000 mg | ORAL_TABLET | Freq: Once | ORAL | 3 refills | Status: AC

## 2020-07-27 MED ORDER — METRONIDAZOLE 500 MG PO TABS: 500.0000 mg | ORAL_TABLET | Freq: Two times a day (BID) | ORAL | 3 refills | Status: AC

## 2020-07-27 NOTE — Progress Notes (Signed)
Patient was assessed and managed by nursing staff during this encounter. I have reviewed the chart and agree with the documentation and plan.   Verita Schneiders, MD 07/27/2020 12:25 PM

## 2020-07-27 NOTE — Addendum Note (Signed)
Addended by: Verita Schneiders A on: 07/27/2020 02:54 PM   Modules accepted: Orders

## 2020-07-31 ENCOUNTER — Encounter: Payer: Self-pay | Admitting: Medical

## 2020-08-01 ENCOUNTER — Ambulatory Visit: Payer: BC Managed Care – PPO | Admitting: Family Medicine

## 2020-08-03 ENCOUNTER — Ambulatory Visit (INDEPENDENT_AMBULATORY_CARE_PROVIDER_SITE_OTHER): Payer: BC Managed Care – PPO | Admitting: Medical

## 2020-08-03 ENCOUNTER — Encounter: Payer: Self-pay | Admitting: Medical

## 2020-08-03 ENCOUNTER — Ambulatory Visit (INDEPENDENT_AMBULATORY_CARE_PROVIDER_SITE_OTHER): Payer: BC Managed Care – PPO | Admitting: Otolaryngology

## 2020-08-03 ENCOUNTER — Other Ambulatory Visit: Payer: Self-pay

## 2020-08-03 VITALS — BP 109/66 | HR 86 | Resp 18 | Ht 64.0 in | Wt 262.0 lb

## 2020-08-03 DIAGNOSIS — R590 Localized enlarged lymph nodes: Secondary | ICD-10-CM | POA: Diagnosis not present

## 2020-08-03 DIAGNOSIS — R21 Rash and other nonspecific skin eruption: Secondary | ICD-10-CM

## 2020-08-03 DIAGNOSIS — R739 Hyperglycemia, unspecified: Secondary | ICD-10-CM

## 2020-08-03 DIAGNOSIS — J310 Chronic rhinitis: Secondary | ICD-10-CM

## 2020-08-03 DIAGNOSIS — Z3490 Encounter for supervision of normal pregnancy, unspecified, unspecified trimester: Secondary | ICD-10-CM | POA: Diagnosis not present

## 2020-08-03 LAB — COMPREHENSIVE METABOLIC PANEL
ALT: 27 U/L (ref 0–35)
AST: 18 U/L (ref 0–37)
Albumin: 3.8 g/dL (ref 3.5–5.2)
Alkaline Phosphatase: 33 U/L — ABNORMAL LOW (ref 39–117)
BUN: 7 mg/dL (ref 6–23)
CO2: 22 mEq/L (ref 19–32)
Calcium: 8.6 mg/dL (ref 8.4–10.5)
Chloride: 105 mEq/L (ref 96–112)
Creatinine, Ser: 0.64 mg/dL (ref 0.40–1.20)
GFR: 121.1 mL/min (ref >60.00)
Glucose, Bld: 126 mg/dL — ABNORMAL HIGH (ref 70–99)
Potassium: 3.3 mEq/L — ABNORMAL LOW (ref 3.5–5.1)
Sodium: 136 mEq/L (ref 135–145)
Total Bilirubin: 0.2 mg/dL (ref 0.2–1.2)
Total Protein: 6.5 g/dL (ref 6.0–8.3)

## 2020-08-03 LAB — HEMOGLOBIN A1C: Hgb A1c MFr Bld: 6 % (ref 4.6–6.5)

## 2020-08-03 MED ORDER — TRIAMCINOLONE ACETONIDE 0.025 % EX OINT: 1.0000 | TOPICAL_OINTMENT | Freq: Two times a day (BID) | CUTANEOUS | 0 refills | Status: DC

## 2020-08-03 NOTE — Progress Notes (Signed)
HPI: Melanie Cruz is a 25 y.o. female who presents is referred by her PCP Dr. Harvie Heck for evaluation of recent onset lymphadenopathy that patient is noticed for about 3 months now.  She apparently had COVID last year and ever since that time has had more sinus issues with sinus congestion.  She has been prescribed Flonase for her sinuses.  But she is referred here more for evaluation of the lymphadenopathy. Denies any trauma to her neck or infections of her neck.  She has had no bites or scratches.  The lymph nodes have not been sore.  The larger lymph node is in the right neck which she noticed a small lymph node on the left neck..  Past Medical History:  Diagnosis Date   COVID-19    Migraine    Past Surgical History:  Procedure Laterality Date   NO PAST SURGERIES     Social History   Socioeconomic History   Marital status: Single    Spouse name: Not on file   Number of children: Not on file   Years of education: Not on file   Highest education level: Not on file  Occupational History   Not on file  Tobacco Use   Smoking status: Every Day    Packs/day: 0.50    Years: 5.00    Pack years: 2.50    Types: Cigarettes   Smokeless tobacco: Never  Vaping Use   Vaping Use: Former  Substance and Sexual Activity   Alcohol use: Yes    Comment: occasionally    Drug use: Not Currently    Types: Marijuana    Comment: occasional-last use 05/23/2017   Sexual activity: Yes    Birth control/protection: Inserts    Comment: nuvaring   Other Topics Concern   Not on file  Social History Narrative   Lives with roommate   Right handed   Caffeine: 0-1 cup/day   Social Determinants of Health   Financial Resource Strain: Not on file  Food Insecurity: Not on file  Transportation Needs: Not on file  Physical Activity: Not on file  Stress: Not on file  Social Connections: Not on file   Family History  Problem Relation Age of Onset   Hypertension Mother    Diabetes Mother    Breast  cancer Paternal Aunt        age at onset 21's   Migraines Neg Hx    Allergies  Allergen Reactions   Cefdinir Other (See Comments)    Had sensation in throat after taking, was getting over a cold at the time.   Prior to Admission medications   Medication Sig Start Date End Date Taking? Authorizing Provider  busPIRone (BUSPAR) 7.5 MG tablet Take 1 tablet (7.5 mg total) by mouth 2 (two) times daily. 03/06/20   Saguier, Percell Miller, PA-C  diclofenac (VOLTAREN) 75 MG EC tablet 1 tab po bid prn headache. 12/22/19   Saguier, Percell Miller, PA-C  etonogestrel-ethinyl estradiol (NUVARING) 0.12-0.015 MG/24HR vaginal ring Insert vaginally and leave in place for 3 consecutive weeks, then remove for 1 week. 11/25/19   Truett Mainland, DO  fluticasone (FLONASE) 50 MCG/ACT nasal spray Place 2 sprays into both nostrils daily. 05/09/20   Saguier, Percell Miller, PA-C  metFORMIN (GLUCOPHAGE) 500 MG tablet TAKE 1 TABLET (500 MG TOTAL) BY MOUTH 2 (TWO) TIMES DAILY WITH A MEAL. 04/06/20 04/06/21  Truett Mainland, DO  metroNIDAZOLE (FLAGYL) 500 MG tablet Take 1 tablet (500 mg total) by mouth 2 (two) times daily for 7  days. 07/27/20 08/03/20  Osborne Oman, MD  naproxen (NAPROSYN) 500 MG tablet Take 1 tablet (500 mg total) by mouth 2 (two) times daily. 05/24/19   Horton, Barbette Hair, MD  triamcinolone (KENALOG) 0.025 % ointment Apply 1 application topically 2 (two) times daily. 08/03/20   Saguier, Percell Miller, PA-C     Positive ROS: Otherwise negative  All other systems have been reviewed and were otherwise negative with the exception of those mentioned in the HPI and as above.  Physical Exam: Constitutional: Alert, well-appearing, no acute distress Ears: External ears without lesions or tenderness. Ear canals are clear bilaterally with no signs of infection.  Both TMs are clear. Nasal: External nose without lesions. Septum with mild deviation and moderate rhinitis.  After decongesting the nose both the middle meatus regions were clear  with no obvious mucopurulent discharge noted although she does have moderate rhinitis and moderate swelling with mostly clear mucus discharge..  Oral: Lips and gums without lesions. Tongue and palate mucosa without lesions. Posterior oropharynx clear..  Tonsils are symmetric in average size with no exudate. Neck: Patient has approximate 2 cm right posterior neck node along the accessory chain of lymph nodes.  She also has a 1 and half centimeter posterior higher lymph node on the left side along the accessory chain of lymph nodes.  These nodes are mobile and nontender.  She has no significant adenopathy along the jugular chain of lymph nodes anteriorly.  No significant supraclavicular adenopathy noted. Respiratory: Breathing comfortably  Skin: No facial/neck lesions or rash noted.  Procedures  Assessment: Lymphadenopathy questionable etiology probable reactive but could possibly represent lymphoma. Chronic rhinitis  Plan: Placed her on Nasacort 2 sprays each nostril at night in addition to Augmentin 875 mg twice daily for 10days. She will follow-up here in 3 weeks for recheck.  If the lymphadenopathy persists or enlarges we will possibly discuss excisional biopsy at that time but hopefully they will respond to above therapy.   Radene Journey, MD   CC:

## 2020-08-03 NOTE — Progress Notes (Signed)
Subjective:    Patient ID: Melanie Cruz, female    DOB: 1995-09-18, 25 y.o.   MRN: [id number]  HPI   Pt in states she had some recent itching to her toes and some itching to her fingers.  Itching occurred on Saturday night. Stopped yesterday.   Pt states day her toes were itching got a pedicure. Pt states they uses some device to scrub down her toes.   No suspicious exposures to her hands.   Pt thinks when she was younger diagnosed with eczema.  Lmp- not sure. Pt states might terminate pregnancy. She has been consulting with gynecologist. Pt has gone to clinic to discuss her options. Pt is debating her options. Pt has one child. Pt states 6 weeks per clinic she talked to.     Review of Systems  Constitutional:  Negative for chills, fatigue and fever.  Respiratory:  Negative for cough, chest tightness, shortness of breath and wheezing.   Cardiovascular:  Negative for chest pain and palpitations.  Gastrointestinal:  Negative for abdominal pain, blood in stool and constipation.  Musculoskeletal:  Negative for back pain.  Skin:  Positive for rash.  Neurological:  Negative for dizziness and headaches.  Hematological:  Negative for adenopathy. Does not bruise/bleed easily.  Psychiatric/Behavioral:  Negative for behavioral problems, hallucinations and sleep disturbance. The patient is not nervous/anxious.     Past Medical History:  Diagnosis Date   COVID-19    Migraine      Social History   Socioeconomic History   Marital status: Single    Spouse name: Not on file   Number of children: Not on file   Years of education: Not on file   Highest education level: Not on file  Occupational History   Not on file  Tobacco Use   Smoking status: Every Day    Packs/day: 0.50    Years: 5.00    Pack years: 2.50    Types: Cigarettes   Smokeless tobacco: Never  Vaping Use   Vaping Use: Former  Substance and Sexual Activity   Alcohol use: Yes    Comment: occasionally    Drug  use: Not Currently    Types: Marijuana    Comment: occasional-last use 05/23/2017   Sexual activity: Yes    Birth control/protection: Inserts    Comment: nuvaring   Other Topics Concern   Not on file  Social History Narrative   Lives with roommate   Right handed   Caffeine: 0-1 cup/day   Social Determinants of Health   Financial Resource Strain: Not on file  Food Insecurity: Not on file  Transportation Needs: Not on file  Physical Activity: Not on file  Stress: Not on file  Social Connections: Not on file  Intimate Partner Violence: Not on file    Past Surgical History:  Procedure Laterality Date   NO PAST SURGERIES      Family History  Problem Relation Age of Onset   Hypertension Mother    Diabetes Mother    Breast cancer Paternal Aunt        age at onset 34's   Migraines Neg Hx     Allergies  Allergen Reactions   Cefdinir Other (See Comments)    Had sensation in throat after taking, was getting over a cold at the time.    Current Outpatient Medications on File Prior to Visit  Medication Sig Dispense Refill   busPIRone (BUSPAR) 7.5 MG tablet Take 1 tablet (7.5 mg total) by mouth 2 (  two) times daily. 60 tablet 3   diclofenac (VOLTAREN) 75 MG EC tablet 1 tab po bid prn headache. 20 tablet 0   etonogestrel-ethinyl estradiol (NUVARING) 0.12-0.015 MG/24HR vaginal ring Insert vaginally and leave in place for 3 consecutive weeks, then remove for 1 week. 3 each 3   fluticasone (FLONASE) 50 MCG/ACT nasal spray Place 2 sprays into both nostrils daily. 16 g 1   metFORMIN (GLUCOPHAGE) 500 MG tablet TAKE 1 TABLET (500 MG TOTAL) BY MOUTH 2 (TWO) TIMES DAILY WITH A MEAL. 180 tablet 3   metroNIDAZOLE (FLAGYL) 500 MG tablet Take 1 tablet (500 mg total) by mouth 2 (two) times daily for 7 days. 14 tablet 3   naproxen (NAPROSYN) 500 MG tablet Take 1 tablet (500 mg total) by mouth 2 (two) times daily. 30 tablet 0   No current facility-administered medications on file prior to visit.     BP 109/66   Pulse 86   Resp 18   Ht '5\' 4"'$  (1.626 m)   Wt 262 lb (118.8 kg)   SpO2 100%   BMI 44.97 kg/m        Objective:   Physical Exam  General- No acute distress. Pleasant patient. Neck- Full range of motion, no jvd Lungs- Clear, even and unlabored. Heart- regular rate and rhythm. Neurologic- CNII- XII grossly intact.   Skin- pt does appear to appear to have some hyperpigmentation of her mcp, dip and pip joints both hands. No obvious swelling.  Feet some slight hyperpigmentation base of rt great toe. Some hyperpigmentation of elbows both sides       Assessment & Plan:   You do have history of eczema per your report and by exam you have areas of hyperpigmentation consistent with eczema.  Recommend that you get over-the-counter palmers moisturizing lotion and use twice daily.  Also recommend moisturizing sensitive skin soap.  If you do have periodic itching in areas can use triamcinolone thin film. Can use 3-5 days consecutive.  Avoid chronic daily use.  Not to use on your face.   Went ahead and referred to dermatologist.  For elevated blood sugar in the past we will get metabolic panel and [id number] today.  For pregnancy deciding on going thru  with verses termination. You have consulted with clinic. If you want referral to obstetrician at [place] please let me know.   Follow up one month or as needed  Mackie Pai, PA-C   Time spent with patient today was 31  minutes which consisted of chart review, discussing diagnosis, work up, treatment and documentation.

## 2020-08-03 NOTE — Patient Instructions (Addendum)
You do have history of eczema per your report and by exam you have areas of hyperpigmentation consistent with eczema.  Recommend that you get over-the-counter palmers moisturizing lotion and use twice daily.  Also recommend moisturizing sensitive skin soap.  If you do have periodic itching in areas can use triamcinolone thin film. Can use 3-5 days consecutive.  Avoid chronic daily use.  Not to use on your face.   Went ahead and referred to dermatologist.  For elevated blood sugar in the past we will get metabolic panel and [id number] today.  For pregnancy deciding on going thru  with verses termination. You have consulted with clinic. If you want referral to obstetrician at [facility] please let me know.   Follow up one month or as needed

## 2020-08-04 ENCOUNTER — Other Ambulatory Visit (INDEPENDENT_AMBULATORY_CARE_PROVIDER_SITE_OTHER): Payer: Self-pay | Admitting: Otolaryngology

## 2020-08-04 MED ORDER — AMOXICILLIN-POT CLAVULANATE 875-125 MG PO TABS
1.0000 | ORAL_TABLET | Freq: Two times a day (BID) | ORAL | 0 refills | Status: DC
Start: 2020-08-04 — End: 2020-09-20

## 2020-08-04 MED ORDER — TRIAMCINOLONE ACETONIDE 55 MCG/ACT NA AERO: 2.0000 | INHALATION_SPRAY | Freq: Every day | NASAL | 12 refills | Status: DC

## 2020-08-05 ENCOUNTER — Telehealth: Payer: Self-pay | Admitting: Medical

## 2020-08-05 MED ORDER — POTASSIUM CHLORIDE CRYS ER 10 MEQ PO TBCR: EXTENDED_RELEASE_TABLET | ORAL | 0 refills | Status: DC

## 2020-08-05 NOTE — Telephone Encounter (Signed)
Rx k-dur sent to pt pharmacy. 

## 2020-08-08 ENCOUNTER — Encounter: Payer: Self-pay | Admitting: Medical

## 2020-08-29 ENCOUNTER — Ambulatory Visit (INDEPENDENT_AMBULATORY_CARE_PROVIDER_SITE_OTHER): Payer: BC Managed Care – PPO | Admitting: Otolaryngology

## 2020-08-30 ENCOUNTER — Ambulatory Visit (INDEPENDENT_AMBULATORY_CARE_PROVIDER_SITE_OTHER): Payer: BC Managed Care – PPO | Admitting: Otolaryngology

## 2020-08-30 ENCOUNTER — Other Ambulatory Visit: Payer: Self-pay

## 2020-08-30 DIAGNOSIS — R590 Localized enlarged lymph nodes: Secondary | ICD-10-CM | POA: Diagnosis not present

## 2020-08-30 NOTE — Progress Notes (Signed)
HPI: Melanie Cruz is a 25 y.o. female who returns today for evaluation of neck lymphadenopathy.  She feels like it is gotten smaller since taking the Augmentin.  She is doing well otherwise.  Of note her child who is 3 was also had noted to have lymphadenopathy and she is seen her pediatrician with him..  Past Medical History:  Diagnosis Date   COVID-19    Migraine    Past Surgical History:  Procedure Laterality Date   NO PAST SURGERIES     Social History   Socioeconomic History   Marital status: Single    Spouse name: Not on file   Number of children: Not on file   Years of education: Not on file   Highest education level: Not on file  Occupational History   Not on file  Tobacco Use   Smoking status: Every Day    Packs/day: 0.50    Years: 5.00    Pack years: 2.50    Types: Cigarettes   Smokeless tobacco: Never  Vaping Use   Vaping Use: Former  Substance and Sexual Activity   Alcohol use: Yes    Comment: occasionally    Drug use: Not Currently    Types: Marijuana    Comment: occasional-last use 05/23/2017   Sexual activity: Yes    Birth control/protection: Inserts    Comment: nuvaring   Other Topics Concern   Not on file  Social History Narrative   Lives with roommate   Right handed   Caffeine: 0-1 cup/day   Social Determinants of Health   Financial Resource Strain: Not on file  Food Insecurity: Not on file  Transportation Needs: Not on file  Physical Activity: Not on file  Stress: Not on file  Social Connections: Not on file   Family History  Problem Relation Age of Onset   Hypertension Mother    Diabetes Mother    Breast cancer Paternal Aunt        age at onset 44's   Migraines Neg Hx    Allergies  Allergen Reactions   Cefdinir Other (See Comments)    Had sensation in throat after taking, was getting over a cold at the time.   Prior to Admission medications   Medication Sig Start Date End Date Taking? Authorizing Provider   amoxicillin-clavulanate (AUGMENTIN) 875-125 MG tablet Take 1 tablet by mouth 2 (two) times daily. 08/04/20   Rozetta Nunnery, MD  busPIRone (BUSPAR) 7.5 MG tablet Take 1 tablet (7.5 mg total) by mouth 2 (two) times daily. 03/06/20   Saguier, Percell Miller, PA-C  diclofenac (VOLTAREN) 75 MG EC tablet 1 tab po bid prn headache. 12/22/19   Saguier, Percell Miller, PA-C  etonogestrel-ethinyl estradiol (NUVARING) 0.12-0.015 MG/24HR vaginal ring Insert vaginally and leave in place for 3 consecutive weeks, then remove for 1 week. 11/25/19   Truett Mainland, DO  fluticasone (FLONASE) 50 MCG/ACT nasal spray Place 2 sprays into both nostrils daily. 05/09/20   Saguier, Percell Miller, PA-C  metFORMIN (GLUCOPHAGE) 500 MG tablet TAKE 1 TABLET (500 MG TOTAL) BY MOUTH 2 (TWO) TIMES DAILY WITH A MEAL. 04/06/20 04/06/21  Truett Mainland, DO  naproxen (NAPROSYN) 500 MG tablet Take 1 tablet (500 mg total) by mouth 2 (two) times daily. 05/24/19   Horton, Barbette Hair, MD  potassium chloride (KLOR-CON) 10 MEQ tablet 1 tab po q day. 08/05/20   Saguier, Percell Miller, PA-C  triamcinolone (KENALOG) 0.025 % ointment Apply 1 application topically 2 (two) times daily. 08/03/20   Saguier, Percell Miller,  PA-C  triamcinolone (NASACORT) 55 MCG/ACT AERO nasal inhaler Place 2 sprays into the nose daily. 2 sprays each nostril at night 08/04/20   Rozetta Nunnery, MD     Positive ROS: Otherwise negative  All other systems have been reviewed and were otherwise negative with the exception of those mentioned in the HPI and as above.  Physical Exam: Constitutional: Alert, well-appearing, no acute distress Ears: External ears without lesions or tenderness. Ear canals are clear bilaterally with intact, clear TMs.  Nasal: External nose without lesions.. Clear nasal passages Oral: Lips and gums without lesions. Tongue and palate mucosa without lesions. Posterior oropharynx clear. Neck: On exam patient has an approximate 1.5 cm right-sided accessory chain lymph node just  posterior to the sternocleidomastoid muscle.  This is slightly smaller compared to previous exam that measured approximately 2 cm.  Patient also feels like it is gotten smaller.  Is not tender to palpation.  She has no supraclavicular adenopathy and no other obvious adenopathy on the right side.  She also has a smaller left superior neck node. Respiratory: Breathing comfortably  Skin: No facial/neck lesions or rash noted.  Procedures  Assessment: Cervical lymphadenopathy most likely represents reactive lymphadenopathy.  Plan: Recommend 1 more follow-up in 3 to 4 weeks for recheck to make sure these do not enlarge. Also gave her a prescription for Augmentin to take if she develops any enlargement of lymph node.   Radene Journey, MD

## 2020-09-03 ENCOUNTER — Other Ambulatory Visit: Payer: Self-pay | Admitting: Medical

## 2020-09-04 ENCOUNTER — Encounter: Payer: Self-pay | Admitting: Medical

## 2020-09-04 NOTE — Telephone Encounter (Signed)
Do you suggest an antihistamine?

## 2020-09-04 NOTE — Telephone Encounter (Signed)
On 08/03/20 she saw ENT and they put her on Nascort.  Is she to use both?

## 2020-09-07 ENCOUNTER — Ambulatory Visit: Payer: BC Managed Care – PPO | Admitting: Family Medicine

## 2020-09-14 ENCOUNTER — Other Ambulatory Visit: Payer: Self-pay

## 2020-09-14 ENCOUNTER — Other Ambulatory Visit (HOSPITAL_COMMUNITY)
Admission: RE | Admit: 2020-09-14 | Discharge: 2020-09-14 | Disposition: A | Discharge status: Home / Self Care | Payer: BC Managed Care – PPO | Source: Ambulatory Visit | Attending: Family Medicine | Admitting: Family Medicine

## 2020-09-14 ENCOUNTER — Ambulatory Visit: Payer: BC Managed Care – PPO | Admitting: Family Medicine

## 2020-09-14 ENCOUNTER — Encounter: Payer: Self-pay | Admitting: Family Medicine

## 2020-09-14 VITALS — BP 106/66 | HR 85 | Wt 220.0 lb

## 2020-09-14 DIAGNOSIS — Z113 Encounter for screening for infections with a predominantly sexual mode of transmission: Secondary | ICD-10-CM

## 2020-09-14 NOTE — Progress Notes (Signed)
   Subjective:    Patient ID: Melanie Cruz, female    DOB: December 30, 1995, 25 y.o.   MRN: [id number]  HPI  Desires testing for STDs. Having vaginal discharge and pelvic pressure when she's sitting down.  Review of Systems     Objective:   Physical Exam Vitals reviewed. Exam conducted with a chaperone present.  Constitutional:      Appearance: Normal appearance.  Abdominal:     Hernia: There is no hernia in the left inguinal area or right inguinal area.  Genitourinary:    Labia:        Right: No rash, tenderness or lesion.        Left: No rash, tenderness or lesion.   Lymphadenopathy:     Lower Body: No right inguinal adenopathy. No left inguinal adenopathy.  Neurological:     Mental Status: She is alert.      Assessment & Plan:   1. Screening for STD (sexually transmitted disease) Follow results of vaginal testing. - Cervicovaginal ancillary only( [facility])

## 2020-09-18 ENCOUNTER — Ambulatory Visit: Payer: BC Managed Care – PPO | Admitting: Medical

## 2020-09-18 LAB — CERVICOVAGINAL ANCILLARY ONLY
Bacterial Vaginitis (gardnerella): POSITIVE — AB
Candida Glabrata: NEGATIVE
Candida Vaginitis: NEGATIVE
Chlamydia: POSITIVE — AB
Comment: NEGATIVE
Comment: NEGATIVE
Comment: NEGATIVE
Comment: NEGATIVE
Comment: NEGATIVE
Comment: NORMAL
Neisseria Gonorrhea: NEGATIVE
Trichomonas: NEGATIVE

## 2020-09-18 MED ORDER — AZITHROMYCIN 500 MG PO TABS: 1000.0000 mg | ORAL_TABLET | Freq: Once | ORAL | 1 refills | Status: AC

## 2020-09-18 MED ORDER — METRONIDAZOLE 500 MG PO TABS: 500.0000 mg | ORAL_TABLET | Freq: Two times a day (BID) | ORAL | 0 refills | Status: DC

## 2020-09-18 NOTE — Addendum Note (Signed)
Addended by: Truett Mainland on: 09/18/2020 03:46 PM   Modules accepted: Orders

## 2020-09-19 ENCOUNTER — Other Ambulatory Visit: Payer: BC Managed Care – PPO

## 2020-09-20 ENCOUNTER — Other Ambulatory Visit: Payer: Self-pay

## 2020-09-20 ENCOUNTER — Ambulatory Visit: Payer: BC Managed Care – PPO | Admitting: Medical

## 2020-09-20 ENCOUNTER — Ambulatory Visit (HOSPITAL_BASED_OUTPATIENT_CLINIC_OR_DEPARTMENT_OTHER)
Admission: RE | Admit: 2020-09-20 | Discharge: 2020-09-20 | Disposition: A | Discharge status: Home / Self Care | Payer: BC Managed Care – PPO | Source: Ambulatory Visit | Attending: Medical | Admitting: Medical

## 2020-09-20 VITALS — BP 118/57 | HR 72 | Ht 64.0 in | Wt 220.0 lb

## 2020-09-20 DIAGNOSIS — K5900 Constipation, unspecified: Secondary | ICD-10-CM

## 2020-09-20 DIAGNOSIS — R1010 Upper abdominal pain, unspecified: Secondary | ICD-10-CM

## 2020-09-20 DIAGNOSIS — R109 Unspecified abdominal pain: Secondary | ICD-10-CM | POA: Diagnosis not present

## 2020-09-20 LAB — COMPREHENSIVE METABOLIC PANEL
ALT: 17 U/L (ref 0–35)
AST: 12 U/L (ref 0–37)
Albumin: 4.1 g/dL (ref 3.5–5.2)
Alkaline Phosphatase: 36 U/L — ABNORMAL LOW (ref 39–117)
BUN: 12 mg/dL (ref 6–23)
CO2: 22 mEq/L (ref 19–32)
Calcium: 8.8 mg/dL (ref 8.4–10.5)
Chloride: 109 mEq/L (ref 96–112)
Creatinine, Ser: 0.69 mg/dL (ref 0.40–1.20)
GFR: 118.81 mL/min (ref >60.00)
Glucose, Bld: 87 mg/dL (ref 70–99)
Potassium: 4.3 mEq/L (ref 3.5–5.1)
Sodium: 138 mEq/L (ref 135–145)
Total Bilirubin: 0.2 mg/dL (ref 0.2–1.2)
Total Protein: 7.1 g/dL (ref 6.0–8.3)

## 2020-09-20 LAB — CBC WITH DIFFERENTIAL/PLATELET
Basophils Absolute: 0.1 10*3/uL (ref 0.0–0.1)
Basophils Relative: 1.2 % (ref 0.0–3.0)
Eosinophils Absolute: 0 10*3/uL (ref 0.0–0.7)
Eosinophils Relative: 0.7 % (ref 0.0–5.0)
HCT: 37.7 % (ref 36.0–46.0)
Hemoglobin: 11.9 g/dL — ABNORMAL LOW (ref 12.0–15.0)
Lymphocytes Relative: 34.7 % (ref 12.0–46.0)
Lymphs Abs: 2.3 10*3/uL (ref 0.7–4.0)
MCHC: 31.4 g/dL (ref 30.0–36.0)
MCV: 81.4 fl (ref 78.0–100.0)
Monocytes Absolute: 0.4 10*3/uL (ref 0.1–1.0)
Monocytes Relative: 6 % (ref 3.0–12.0)
Neutro Abs: 3.8 10*3/uL (ref 1.4–7.7)
Neutrophils Relative %: 57.4 % (ref 43.0–77.0)
Platelets: 213 10*3/uL (ref 150.0–400.0)
RBC: 4.64 Mil/uL (ref 3.87–5.11)
RDW: 15.2 % (ref 11.5–15.5)
WBC: 6.6 10*3/uL (ref 4.0–10.5)

## 2020-09-20 LAB — LIPASE: Lipase: 19 U/L (ref 11.0–59.0)

## 2020-09-20 NOTE — Patient Instructions (Signed)
Recent intermittent mild transient left side abdomen pain.  You have difficulty quantifying the frequency.  The pain appears mild.  Some speculation/correlation to constipation per your report.  So we will get x-ray abdomen to assess stool volume.  Also want to get CBC, CMP and lipase.  If significant stool burden present on left side will give some medication recommendations to clear stool and see if symptoms resolve.  No recent use of antibiotics might be playing a role in upset stomach/abdomen pain.  Including H. pylori breath test and labs.  Recent chlamydia infection.  Taking second round of azithromycin since he vomited up after first round of treatment.  I think the pain is too high to be associated with chlamydia infection.  You do describe some excess flatulence/gas.  You can try Gas-X or Phazyme over-the-counter.  Follow-up date to be determined after lab and x-ray review.

## 2020-09-20 NOTE — Progress Notes (Signed)
Subjective:    Patient ID: Melanie Cruz, female    DOB: November 08, 1995, 25 y.o.   MRN: [id number]  HPI  Pt state has had stomach pain for about 2 weeks.   Pain left side abdomen. Pain is on and off. Pain will last a minute. She can't give specifics as to how frequently this is occurring.   Pt feels maybe constipated. She uses bathroom feels like has to strain but can go daily.   No nausea or vomiting. No back pain.   No urinary symptoms.  Not having reflux symptoms. Pt does mention excess passing gas.   Pt recently had vaginal dc and gyn order sti testing. She started tx for chlamydia. Also had bv. She started flagyl.  Pt took zpack once and vomited after tab. So she has refill.  Pt had abortion 102 th of last month.pt feels like cycle about to start.   Review of Systems  Constitutional:  Negative for chills, fatigue and fever.  Respiratory:  Negative for cough, chest tightness, shortness of breath and wheezing.   Cardiovascular:  Negative for chest pain and palpitations.  Gastrointestinal:  Positive for abdominal pain and constipation. Negative for blood in stool, diarrhea and nausea.       Possible constipation.  Genitourinary:  Negative for difficulty urinating, dysuria, enuresis and flank pain.  Musculoskeletal:  Negative for back pain.    Past Medical History:  Diagnosis Date   COVID-19    Migraine      Social History   Socioeconomic History   Marital status: Single    Spouse name: Not on file   Number of children: Not on file   Years of education: Not on file   Highest education level: Not on file  Occupational History   Not on file  Tobacco Use   Smoking status: Every Day    Packs/day: 0.50    Years: 5.00    Pack years: 2.50    Types: Cigarettes   Smokeless tobacco: Never  Vaping Use   Vaping Use: Former  Substance and Sexual Activity   Alcohol use: Yes    Comment: occasionally    Drug use: Not Currently    Types: Marijuana    Comment:  occasional-last use 05/23/2017   Sexual activity: Yes    Birth control/protection: Inserts    Comment: nuvaring   Other Topics Concern   Not on file  Social History Narrative   Lives with roommate   Right handed   Caffeine: 0-1 cup/day   Social Determinants of Health   Financial Resource Strain: Not on file  Food Insecurity: Not on file  Transportation Needs: Not on file  Physical Activity: Not on file  Stress: Not on file  Social Connections: Not on file  Intimate Partner Violence: Not on file    Past Surgical History:  Procedure Laterality Date   NO PAST SURGERIES      Family History  Problem Relation Age of Onset   Hypertension Mother    Diabetes Mother    Breast cancer Paternal Aunt        age at onset 68's   Migraines Neg Hx     Allergies  Allergen Reactions   Cefdinir Other (See Comments)    Had sensation in throat after taking, was getting over a cold at the time.    Current Outpatient Medications on File Prior to Visit  Medication Sig Dispense Refill   busPIRone (BUSPAR) 7.5 MG tablet Take 1 tablet (7.5 mg total)  by mouth 2 (two) times daily. 60 tablet 3   diclofenac (VOLTAREN) 75 MG EC tablet 1 tab po bid prn headache. 20 tablet 0   etonogestrel-ethinyl estradiol (NUVARING) 0.12-0.015 MG/24HR vaginal ring Insert vaginally and leave in place for 3 consecutive weeks, then remove for 1 week. 3 each 3   fluticasone (FLONASE) 50 MCG/ACT nasal spray Place 2 sprays into both nostrils daily. 16 g 1   metFORMIN (GLUCOPHAGE) 500 MG tablet TAKE 1 TABLET (500 MG TOTAL) BY MOUTH 2 (TWO) TIMES DAILY WITH A MEAL. 180 tablet 3   metroNIDAZOLE (FLAGYL) 500 MG tablet Take 1 tablet (500 mg total) by mouth 2 (two) times daily. 14 tablet 0   naproxen (NAPROSYN) 500 MG tablet Take 1 tablet (500 mg total) by mouth 2 (two) times daily. 30 tablet 0   potassium chloride (KLOR-CON) 10 MEQ tablet 1 tab po q day. (Patient not taking: Reported on 09/14/2020) 5 tablet 0   triamcinolone  (KENALOG) 0.025 % ointment Apply 1 application topically 2 (two) times daily. 30 g 0   triamcinolone (NASACORT) 55 MCG/ACT AERO nasal inhaler Place 2 sprays into the nose daily. 2 sprays each nostril at night (Patient not taking: Reported on 09/14/2020) 1 each 12   No current facility-administered medications on file prior to visit.    BP (!) 118/57   Pulse 72   Ht '5\' 4"'$  (1.626 m)   Wt 220 lb (99.8 kg)   SpO2 98%   BMI 37.76 kg/m       Objective:   Physical Exam   General- No acute distress. Pleasant patient. Neck- Full range of motion, no jvd Lungs- Clear, even and unlabored. Heart- regular rate and rhythm. Neurologic- CNII- XII grossly intact.   Back- no cva tenderness.  Abdomen- soft, nt, nd, +bs, no rebound or guarding. No organomegaly. No adnexal pain.  Assessment & Plan:   Patient Instructions  Recent intermittent mild transient left side abdomen pain.  You have difficulty quantifying the frequency.  The pain appears mild.  Some speculation/correlation to constipation per your report.  So we will get x-ray abdomen to assess stool volume.  Also want to get CBC, CMP and lipase.  If significant stool burden present on left side will give some medication recommendations to clear stool and see if symptoms resolve.  No recent use of antibiotics might be playing a role in upset stomach/abdomen pain.  Including H. pylori breath test and labs.  Recent chlamydia infection.  Taking second round of azithromycin since he vomited up after first round of treatment.  I think the pain is too high to be associated with chlamydia infection.  You do describe some excess flatulence/gas.  You can try Gas-X or Phazyme over-the-counter.  Follow-up date to be determined after lab and x-ray review.   Mackie Pai, PA-C   Time spent with patient today was  32 minutes which consisted of chart review, discussing diagnosis, work up treatment and documentation.

## 2020-09-21 ENCOUNTER — Encounter: Payer: Self-pay | Admitting: Medical

## 2020-09-21 LAB — H. PYLORI BREATH TEST: H. pylori Breath Test: NOT DETECTED

## 2020-09-27 ENCOUNTER — Ambulatory Visit (INDEPENDENT_AMBULATORY_CARE_PROVIDER_SITE_OTHER): Payer: BC Managed Care – PPO | Admitting: Otolaryngology

## 2020-09-29 ENCOUNTER — Ambulatory Visit: Payer: BC Managed Care – PPO | Admitting: Family Medicine

## 2020-09-29 ENCOUNTER — Encounter: Payer: Self-pay | Admitting: Family Medicine

## 2020-09-29 ENCOUNTER — Other Ambulatory Visit: Payer: Self-pay

## 2020-09-29 VITALS — BP 117/81 | HR 78 | Ht 61.0 in | Wt 220.0 lb

## 2020-09-29 DIAGNOSIS — E282 Polycystic ovarian syndrome: Secondary | ICD-10-CM | POA: Diagnosis not present

## 2020-09-29 DIAGNOSIS — Z113 Encounter for screening for infections with a predominantly sexual mode of transmission: Secondary | ICD-10-CM | POA: Diagnosis not present

## 2020-09-29 MED ORDER — ETONOGESTREL-ETHINYL ESTRADIOL 0.12-0.015 MG/24HR VA RING: VAGINAL_RING | VAGINAL | 3 refills | Status: DC

## 2020-09-29 NOTE — Progress Notes (Signed)
   Subjective:    Patient ID: Melanie Cruz, female    DOB: 28-Jul-1995, 25 y.o.   MRN: [id number]  HPI Patient seen for follow up of PCOS. She is having periods regularly. Is experiencing hirsuitism, but will be getting laser hair removal for that.   Is using nuvaring for contraception and estrogen balance. No problems with nuvaring. Did have accidental pregnancy due to improper use.  Recently diagnosed with chlamydia - had discharge and pelvic pain, both of which has resolved with treatment.   Review of Systems     Objective:   Physical Exam Vitals reviewed.  Constitutional:      Appearance: Normal appearance.  Cardiovascular:     Rate and Rhythm: Normal rate and regular rhythm.     Pulses: Normal pulses.  Pulmonary:     Effort: Pulmonary effort is normal.     Breath sounds: Normal breath sounds.  Neurological:     Mental Status: She is alert.       Assessment & Plan:  1. Screening for STD (sexually transmitted disease) Due to recent chlamydia infection, will do serum STI screening. TOC in 4 weeks. - Hepatitis B Surface AntiGEN - Hepatitis C Antibody - RPR - HIV antibody (with reflex)  2. PCOS (polycystic ovarian syndrome) Stable. Continue with nuvaring, metformin.

## 2020-09-29 NOTE — Progress Notes (Signed)
Patient presents for follow up of PCOS and vaginal irritation. Kathrene Alu RN  Patient needs refill of Nuvaring.

## 2020-09-30 LAB — HEPATITIS C ANTIBODY: Hep C Virus Ab: <0.1 s/co ratio (ref 0.0–0.9)

## 2020-09-30 LAB — HEPATITIS B SURFACE ANTIGEN: Hepatitis B Surface Ag: NEGATIVE

## 2020-09-30 LAB — RPR: RPR Ser Ql: NONREACTIVE

## 2020-09-30 LAB — HIV ANTIBODY (ROUTINE TESTING W REFLEX): HIV Screen 4th Generation wRfx: NONREACTIVE

## 2020-10-04 ENCOUNTER — Ambulatory Visit (INDEPENDENT_AMBULATORY_CARE_PROVIDER_SITE_OTHER): Payer: BC Managed Care – PPO | Admitting: Otolaryngology

## 2020-10-05 ENCOUNTER — Ambulatory Visit: Payer: BC Managed Care – PPO | Admitting: Family Medicine

## 2020-10-06 ENCOUNTER — Encounter: Payer: Self-pay | Admitting: Medical

## 2020-10-10 ENCOUNTER — Ambulatory Visit: Payer: BC Managed Care – PPO | Admitting: Medical

## 2020-10-11 MED ORDER — MEDROXYPROGESTERONE ACETATE 10 MG PO TABS: 10.0000 mg | ORAL_TABLET | Freq: Every day | ORAL | 0 refills | Status: DC

## 2020-10-13 ENCOUNTER — Other Ambulatory Visit: Payer: Self-pay

## 2020-10-13 ENCOUNTER — Ambulatory Visit: Payer: BC Managed Care – PPO | Admitting: Medical

## 2020-10-13 VITALS — BP 118/67 | HR 79 | Temp 97.9°F | Ht 61.0 in | Wt 217.6 lb

## 2020-10-13 DIAGNOSIS — K219 Gastro-esophageal reflux disease without esophagitis: Secondary | ICD-10-CM | POA: Diagnosis not present

## 2020-10-13 MED ORDER — AZITHROMYCIN 500 MG PO TABS: 500.0000 mg | ORAL_TABLET | Freq: Every day | ORAL | 1 refills | Status: DC

## 2020-10-13 MED ORDER — FAMOTIDINE 20 MG PO TABS: 20.0000 mg | ORAL_TABLET | Freq: Every day | ORAL | 0 refills | Status: DC

## 2020-10-13 NOTE — Progress Notes (Signed)
Subjective:    Patient ID: Melanie Cruz, female    DOB: [birth year], 25 y.o.   MRN: [id number]  HPI  Pt in reporting some epigastric are pain. She has been belching recently and having acid sensation in stomach. States also after burp and acid burn sensation to throat noticed food particle in mouth. This was after greasy meal.  Having intermittent above symptoms for past 10 days.   Pt tried some tums and it did help. Also has tried some gas-x.   Pt does drink [place] dry about 2-3 a day. Has been eating some chocalate chip cookies. Occaional fruit juice.   Night had obvious symptoms had fried pork chops, rice and egg rolls.   Lmp- Sept 26, 2022.  Note see last labs done.  Pt states rt breast small lump on week ago only for one day.[name] had breast cancer at 35-36. (Pt decide to have gyn check area in 2 weeks)   Review of Systems  Constitutional:  Negative for chills, fatigue and fever.  Respiratory:  Negative for cough, chest tightness, shortness of breath and wheezing.   Cardiovascular:  Negative for chest pain and palpitations.  Gastrointestinal:  Positive for abdominal pain. Negative for blood in stool, constipation, diarrhea and nausea.  Genitourinary:  Negative for dysuria.  Musculoskeletal:  Negative for back pain.  Skin:  Negative for pallor.  Hematological:  Negative for adenopathy.  Psychiatric/Behavioral:  Negative for behavioral problems, confusion and decreased concentration.     Past Medical History:  Diagnosis Date   COVID-19    Migraine      Social History   Socioeconomic History   Marital status: Single    Spouse name: Not on file   Number of children: Not on file   Years of education: Not on file   Highest education level: Not on file  Occupational History   Not on file  Tobacco Use   Smoking status: Every Day    Packs/day: 0.50    Years: 5.00    Pack years: 2.50    Types: Cigarettes   Smokeless tobacco: Never  Vaping Use   Vaping Use:  Former  Substance and Sexual Activity   Alcohol use: Yes    Comment: occasionally    Drug use: Not Currently    Types: Marijuana    Comment: occasional-last use 05/23/2017   Sexual activity: Yes    Birth control/protection: Inserts    Comment: nuvaring   Other Topics Concern   Not on file  Social History Narrative   Lives with roommate   Right handed   Caffeine: 0-1 cup/day   Social Determinants of Health   Financial Resource Strain: Not on file  Food Insecurity: Not on file  Transportation Needs: Not on file  Physical Activity: Not on file  Stress: Not on file  Social Connections: Not on file  Intimate Partner Violence: Not on file    Past Surgical History:  Procedure Laterality Date   NO PAST SURGERIES      Family History  Problem Relation Age of Onset   Hypertension Mother    Diabetes Mother    Breast cancer Paternal Aunt        age at onset 84's   Migraines Neg Hx     Allergies  Allergen Reactions   Cefdinir Other (See Comments)    Had sensation in throat after taking, was getting over a cold at the time.    Current Outpatient Medications on File Prior to Visit  Medication Sig Dispense Refill   busPIRone (BUSPAR) 7.5 MG tablet Take 1 tablet (7.5 mg total) by mouth 2 (two) times daily. 60 tablet 3   diclofenac (VOLTAREN) 75 MG EC tablet 1 tab po bid prn headache. 20 tablet 0   etonogestrel-ethinyl estradiol (NUVARING) 0.12-0.015 MG/24HR vaginal ring Insert vaginally and leave in place for 3 consecutive weeks, then remove for 1 week. 3 each 3   fluticasone (FLONASE) 50 MCG/ACT nasal spray Place 2 sprays into both nostrils daily. 16 g 1   medroxyPROGESTERone (PROVERA) 10 MG tablet Take 1 tablet (10 mg total) by mouth daily. 20 tablet 0   metFORMIN (GLUCOPHAGE) 500 MG tablet TAKE 1 TABLET (500 MG TOTAL) BY MOUTH 2 (TWO) TIMES DAILY WITH A MEAL. 180 tablet 3   metroNIDAZOLE (FLAGYL) 500 MG tablet Take 1 tablet (500 mg total) by mouth 2 (two) times daily. 14  tablet 0   naproxen (NAPROSYN) 500 MG tablet Take 1 tablet (500 mg total) by mouth 2 (two) times daily. 30 tablet 0   potassium chloride (KLOR-CON) 10 MEQ tablet 1 tab po q day. 5 tablet 0   triamcinolone (KENALOG) 0.025 % ointment Apply 1 application topically 2 (two) times daily. 30 g 0   triamcinolone (NASACORT) 55 MCG/ACT AERO nasal inhaler Place 2 sprays into the nose daily. 2 sprays each nostril at night 1 each 12   No current facility-administered medications on file prior to visit.    BP 118/67   Pulse 79   Temp 97.9 F (36.6 C)   Ht 5\' 1"  (1.549 m)   Wt 217 lb 9.6 oz (98.7 kg)   LMP 09/23/2020   SpO2 100%   BMI 41.12 kg/m       Objective:   Physical Exam  General- No acute distress. Pleasant patient. Neck- Full range of motion, no jvd Lungs- Clear, even and unlabored. Heart- regular rate and rhythm. Neurologic- CNII- XII grossly intact.  Abdomen- soft, nt, nd, +bs, no rebound or guarding. Mild epigastric tender.      Assessment & Plan:   Patient Instructions  You do have gerd symptom described. Will prescribe famotadine and advise gerd diet. May need ppi if h2 blocker not adequate.   Discussed recent one day breast lump. Pt decided to do breast exam with gyn in 2 weeks. Explained extreme importance as may need diagnostic mammogram.  Follow 2 weeks with gyn.  Follow up in one month with our office.   Mackie Pai, PA-C

## 2020-10-13 NOTE — Telephone Encounter (Signed)
Patient called requesting another STD screening since she had intercourse with her partner. Patient states that her clitoris is sore.  Patient made aware that it is probably too soon to test again. Patient states that they had intercourse once before he was tested and once after. Patient states her boyfriend stated that he didn't have chlamydia when he was tested.   Per Dr. Nehemiah Settle patient should be treated for chlamydia again and put refill for boyfriend as he needs to take the medication as they have had intercourse. Patient instructed to take medication today and pick up refill for her boyfriend. They both need to take the treatment before having intercourse again. Patient states understanding.  Kathrene Alu RN

## 2020-10-13 NOTE — Patient Instructions (Addendum)
You do have gerd symptom described. Will prescribe famotadine and advise gerd diet. May need ppi if h2 blocker not adequate.   Discussed recent one day breast lump. Pt decided to do breast exam with gyn in 2 weeks. Explained extreme importance as may need diagnostic mammogram.  Follow 2 weeks with gyn.  Follow up in one month with our office.  Barrett's Esophagus Barrett's esophagus occurs when the tissue that lines the esophagus changes or becomes damaged. The esophagus is the tube that carries food from the throat to the stomach. With Barrett's esophagus, the cells that line the esophagus are replaced by cells that are similar to the lining of the intestines (intestinal metaplasia). Barrett's esophagus itself may not cause any symptoms. However, many people who have Barrett's esophagus also have gastroesophageal reflux disease (GERD), which may cause symptoms such as heartburn. Over time, a few people with this condition may develop cancer of the esophagus. Treatment may include medicines, procedures to destroy the abnormal cells, or surgery. What are the causes? The exact cause of this condition is not known. In some cases, the condition develops from damage to the lining of the esophagus caused by gastroesophageal reflux disease (GERD). GERD occurs when stomach acids flow up from the stomach into the esophagus. Frequent symptoms of GERD may cause intestinal metaplasia or cause cell changes (dysplasia). What increases the risk? You are more likely to develop this condition if you: Have GERD. Are female. Are of European descent. Are obese. Are older than 50. Have a hiatal hernia. This is a condition in which part of your stomach bulges into your chest. Smoke. What are the signs or symptoms? People with Barrett's esophagus often have no symptoms. However, many people with this condition also have GERD. Symptoms of GERD may include: Heartburn. Difficulty swallowing. Dry cough. How is this  diagnosed? This condition may be diagnosed based on: Results of an upper gastrointestinal endoscopy. For this exam, a thin, flexible tube with a light and a camera on the end (endoscope) is passed down your esophagus. Your health care provider can view the inside of your esophagus during this procedure. Results of a biopsy. For this procedure, several tissue samples are removed (biopsy) from your esophagus to look at under a microscope. They are then checked for intestinal metaplasia or dysplasia. How is this treated? Treatment for this condition may include: Medicines (proton pump inhibitors, or PPIs) to decrease or stop GERD. Periodic endoscopic exams to make sure that cancer is not developing. A procedure or surgery for dysplasia. This may include: Removal or destruction of abnormal cells. Removal of part of the esophagus. Follow these instructions at home: Eating and drinking Eat more fruits and vegetables. Avoid fatty foods. Eat small, frequent meals instead of large meals. Avoid foods that cause heartburn. These foods include: Coffee and alcoholic drinks. Tomatoes and foods made with tomatoes. Greasy or spicy foods. Chocolate and peppermint. Do not drink alcohol. General instructions Take over-the-counter and prescription medicines only as told by your health care provider. Do not use any products that contain nicotine or tobacco, such as cigarettes, e-cigarettes, and chewing tobacco. If you need help quitting, ask your health care provider. If you are being treated for GERD, make sure you take medicines and follow all instructions as told by your health care provider. Keep all follow-up visits as told by your health care provider. This is important. Contact a health care provider if: You have heartburn or GERD symptoms. You have difficulty swallowing. Get help right  away if: You have chest pain. You are unable to swallow. You vomit blood or material that looks like coffee  grounds. Your stool (feces) is bright red or dark. These symptoms may represent a serious problem that is an emergency. Do not wait to see if the symptoms will go away. Get medical help right away. Call your local emergency services (911 in the [place] Do not drive yourself to the hospital. Summary Barrett's esophagus occurs when the tissue that lines the esophagus changes or becomes damaged. Barrett's esophagus may be diagnosed with an upper gastrointestinal endoscopy and a biopsy. Treatment may include medicines, procedures to remove abnormal cells, or surgery. Follow your health care provider's instructions about what to eat and drink, what medicines to take, and when to call for help. This information is not intended to replace advice given to you by your health care provider. Make sure you discuss any questions you have with your health care provider. Document Revised: 03/13/2019 Document Reviewed: 03/13/2019 Elsevier Patient Education  [place].

## 2020-10-24 ENCOUNTER — Ambulatory Visit: Payer: BC Managed Care – PPO

## 2020-10-27 ENCOUNTER — Other Ambulatory Visit (HOSPITAL_COMMUNITY)
Admission: RE | Admit: 2020-10-27 | Discharge: 2020-10-27 | Disposition: A | Discharge status: Home / Self Care | Payer: BC Managed Care – PPO | Source: Ambulatory Visit | Attending: Family Medicine | Admitting: Family Medicine

## 2020-10-27 ENCOUNTER — Ambulatory Visit (INDEPENDENT_AMBULATORY_CARE_PROVIDER_SITE_OTHER): Payer: BC Managed Care – PPO

## 2020-10-27 ENCOUNTER — Other Ambulatory Visit: Payer: Self-pay

## 2020-10-27 VITALS — BP 109/73 | HR 79 | Wt 221.0 lb

## 2020-10-27 DIAGNOSIS — Z8619 Personal history of other infectious and parasitic diseases: Secondary | ICD-10-CM | POA: Diagnosis not present

## 2020-10-27 NOTE — Progress Notes (Signed)
Chart reviewed - agree with CMA/RN documentation.  ° °

## 2020-10-27 NOTE — Progress Notes (Signed)
Pt presents for TOC for Chlamydia. Self swab was sent to the lab. Bradyn Soward l Kalel Harty, CMA

## 2020-10-30 LAB — CERVICOVAGINAL ANCILLARY ONLY
Chlamydia: NEGATIVE
Comment: NEGATIVE
Comment: NORMAL
Neisseria Gonorrhea: NEGATIVE

## 2020-11-06 ENCOUNTER — Ambulatory Visit: Payer: BC Managed Care – PPO | Admitting: Family Medicine

## 2020-11-07 ENCOUNTER — Encounter: Payer: Self-pay | Admitting: Medical

## 2020-11-07 ENCOUNTER — Ambulatory Visit (INDEPENDENT_AMBULATORY_CARE_PROVIDER_SITE_OTHER): Payer: BC Managed Care – PPO | Admitting: Family

## 2020-11-07 ENCOUNTER — Other Ambulatory Visit: Payer: Self-pay

## 2020-11-07 VITALS — BP 112/59 | HR 78 | Temp 98.9°F | Resp 16 | Wt 228.0 lb

## 2020-11-07 DIAGNOSIS — R197 Diarrhea, unspecified: Secondary | ICD-10-CM

## 2020-11-07 NOTE — Patient Instructions (Addendum)
Please call if you develop abdominal pain, fever, ongoing diarrhea, or blood in the stool.

## 2020-11-07 NOTE — Assessment & Plan Note (Addendum)
Transient/resolved. Reassurance provided.  Pt is advised as follows:  Please call if you develop abdominal pain, fever, ongoing diarrhea, or blood in the stool.   Pt verbalizes understanding.

## 2020-11-07 NOTE — Progress Notes (Signed)
Subjective:   By signing my name below, I, Melanie Cruz, attest that this documentation has been prepared under the direction and in the presence of Debbrah Alar, NP, 11/07/2020   Patient ID: Melanie Cruz, female    DOB: May 06, 1995, 25 y.o.   MRN: [id number]  Chief Complaint  Patient presents with   Abdominal Pain    Patient reports having one episode of abdominal pain that lasted about 1 min. After eating, level of pain about 5.     HPI Patient is in today for an office visit.  Stool concerns: She has a soft stool before bed on Sunday evening. She then had a watery stool in the middle of the night. She passed another stool before bed and it was very liquid. She has also noticed that it had some stringy black material in it and she brings a photo of the stool in the toilet to share with [place]. She denies any pain or discomfort with these abnormal stool. She also denies any blood in the stool. She had no BM yesterday and had one formed stool today.   Health Maintenance Due  Topic Date Due   COVID-19 Vaccine (1) Never done   Pneumococcal Vaccine 18-28 Years old (1 - PCV) Never done   INFLUENZA VACCINE  Never done    Past Medical History:  Diagnosis Date   COVID-19    Migraine     Past Surgical History:  Procedure Laterality Date   NO PAST SURGERIES      Family History  Problem Relation Age of Onset   Hypertension Mother    Diabetes Mother    Breast cancer Paternal Aunt        age at onset 62's   Migraines Neg Hx     Social History   Socioeconomic History   Marital status: Single    Spouse name: Not on file   Number of children: Not on file   Years of education: Not on file   Highest education level: Not on file  Occupational History   Not on file  Tobacco Use   Smoking status: Every Day    Packs/day: 0.50    Years: 5.00    Pack years: 2.50    Types: Cigarettes   Smokeless tobacco: Never  Vaping Use   Vaping Use: Former  Substance and Sexual  Activity   Alcohol use: Yes    Comment: occasionally    Drug use: Not Currently    Types: Marijuana    Comment: occasional-last use 05/23/2017   Sexual activity: Yes    Birth control/protection: Inserts    Comment: nuvaring   Other Topics Concern   Not on file  Social History Narrative   Lives with roommate   Right handed   Caffeine: 0-1 cup/day   Social Determinants of Health   Financial Resource Strain: Not on file  Food Insecurity: Not on file  Transportation Needs: Not on file  Physical Activity: Not on file  Stress: Not on file  Social Connections: Not on file  Intimate Partner Violence: Not on file    Outpatient Medications Prior to Visit  Medication Sig Dispense Refill   busPIRone (BUSPAR) 7.5 MG tablet Take 1 tablet (7.5 mg total) by mouth 2 (two) times daily. 60 tablet 3   diclofenac (VOLTAREN) 75 MG EC tablet 1 tab po bid prn headache. 20 tablet 0   etonogestrel-ethinyl estradiol (NUVARING) 0.12-0.015 MG/24HR vaginal ring Insert vaginally and leave in place for 3 consecutive weeks, then  remove for 1 week. 3 each 3   famotidine (PEPCID) 20 MG tablet Take 1 tablet (20 mg total) by mouth daily. 30 tablet 0   fluticasone (FLONASE) 50 MCG/ACT nasal spray Place 2 sprays into both nostrils daily. 16 g 1   medroxyPROGESTERone (PROVERA) 10 MG tablet Take 1 tablet (10 mg total) by mouth daily. 20 tablet 0   metFORMIN (GLUCOPHAGE) 500 MG tablet TAKE 1 TABLET (500 MG TOTAL) BY MOUTH 2 (TWO) TIMES DAILY WITH A MEAL. 180 tablet 3   naproxen (NAPROSYN) 500 MG tablet Take 1 tablet (500 mg total) by mouth 2 (two) times daily. 30 tablet 0   triamcinolone (KENALOG) 0.025 % ointment Apply 1 application topically 2 (two) times daily. 30 g 0   potassium chloride (KLOR-CON) 10 MEQ tablet 1 tab po q day. (Patient not taking: Reported on 11/07/2020) 5 tablet 0   triamcinolone (NASACORT) 55 MCG/ACT AERO nasal inhaler Place 2 sprays into the nose daily. 2 sprays each nostril at night (Patient  not taking: Reported on 11/07/2020) 1 each 12   azithromycin (ZITHROMAX) 500 MG tablet Take 1 tablet (500 mg total) by mouth daily. (Patient not taking: Reported on 10/27/2020) 2 tablet 1   metroNIDAZOLE (FLAGYL) 500 MG tablet Take 1 tablet (500 mg total) by mouth 2 (two) times daily. (Patient not taking: Reported on 10/27/2020) 14 tablet 0   No facility-administered medications prior to visit.    Allergies  Allergen Reactions   Cefdinir Other (See Comments)    Had sensation in throat after taking, was getting over a cold at the time.    Review of Systems  Gastrointestinal:  Positive for diarrhea. Negative for abdominal pain, blood in stool, nausea and vomiting.      Objective:    Physical Exam Constitutional:      General: She is not in acute distress.    Appearance: Normal appearance. She is not ill-appearing.  HENT:     Head: Normocephalic and atraumatic.     Right Ear: External ear normal.     Left Ear: External ear normal.  Eyes:     Extraocular Movements: Extraocular movements intact.     Pupils: Pupils are equal, round, and reactive to light.  Cardiovascular:     Rate and Rhythm: Normal rate and regular rhythm.     Heart sounds: Normal heart sounds. No murmur heard.   No gallop.  Pulmonary:     Effort: Pulmonary effort is normal. No respiratory distress.     Breath sounds: Normal breath sounds. No wheezing or rales.  Abdominal:     General: Bowel sounds are normal.     Palpations: Abdomen is soft.     Tenderness: There is no abdominal tenderness.  Lymphadenopathy:     Cervical: No cervical adenopathy.  Skin:    General: Skin is warm and dry.  Neurological:     Mental Status: She is alert and oriented to person, place, and time.  Psychiatric:        Behavior: Behavior normal.        Judgment: Judgment normal.    BP (!) 112/59 (BP Location: Right Arm, Patient Position: Sitting, Cuff Size: Large)   Pulse 78   Temp 98.9 F (37.2 C) (Oral)   Resp 16   Wt 228  lb (103.4 kg)   SpO2 100%   BMI 43.08 kg/m  Wt Readings from Last 3 Encounters:  11/07/20 228 lb (103.4 kg)  10/27/20 221 lb (100.2 kg)  10/13/20 217 lb  9.6 oz (98.7 kg)       Assessment & Plan:   Problem List Items Addressed This Visit       Unprioritized   Diarrhea - Primary    Transient/resolved. Reassurance provided.  Pt is advised as follows:  Please call if you develop abdominal pain, fever, ongoing diarrhea, or blood in the stool.   Pt verbalizes understanding.       No orders of the defined types were placed in this encounter.   I, Debbrah Alar, NP, personally preformed the services described in this documentation.  All medical record entries made by the scribe were at my direction and in my presence.  I have reviewed the chart and discharge instructions (if applicable) and agree that the record reflects my personal performance and is accurate and complete. 11/07/2020  I,Melanie Cruz,acting as a Education administrator for Nance Pear, NP.,have documented all relevant documentation on the behalf of Nance Pear, NP,as directed by  Nance Pear, NP while in the presence of Nance Pear, NP.  Nance Pear, NP

## 2020-11-08 ENCOUNTER — Ambulatory Visit: Payer: BC Managed Care – PPO | Admitting: Obstetrics & Gynecology

## 2020-11-14 DIAGNOSIS — O09893 Supervision of other high risk pregnancies, third trimester: Secondary | ICD-10-CM | POA: Diagnosis not present

## 2020-11-14 DIAGNOSIS — O24415 Gestational diabetes mellitus in pregnancy, controlled by oral hypoglycemic drugs: Secondary | ICD-10-CM | POA: Diagnosis not present

## 2020-11-14 DIAGNOSIS — O99820 Streptococcus B carrier state complicating pregnancy: Secondary | ICD-10-CM | POA: Diagnosis not present

## 2020-11-14 DIAGNOSIS — Z3A38 38 weeks gestation of pregnancy: Secondary | ICD-10-CM | POA: Diagnosis not present

## 2020-11-16 ENCOUNTER — Ambulatory Visit: Payer: BC Managed Care – PPO | Admitting: Family Medicine

## 2020-11-21 ENCOUNTER — Encounter: Payer: Self-pay | Admitting: Medical

## 2020-11-22 ENCOUNTER — Telehealth: Payer: Self-pay | Admitting: *Deleted

## 2020-11-22 NOTE — Telephone Encounter (Signed)
Who Is Calling Patient / Member / Family / Caregiver Call Type Triage / Clinical Relationship To Patient Self Return Phone Number [phone] (Primary) Chief Complaint Unclassified Symptom Reason for Call Symptomatic / Request for Health Information Initial Comment Caller states she has a weird sound coming from her stomach, similar to an eruption. Caller denies pain. Translation No Nurse Assessment Nurse: Maebelle Munroe, RN, Langley Gauss Date/Time (Eastern Time): 11/21/2020 11:32:22 PM Confirm and document reason for call. If symptomatic, describe symptoms. ---Caller states she has a weird sound coming from her stomach, similar to an eruption. Caller denies pain. Denies N/V or diarrhea. But had 1 soft BM about 3pm. Does the patient have any new or worsening symptoms? ---Yes Will a triage be completed? ---Yes Related visit to physician within the last 2 weeks? ---Yes Does the PT have any chronic conditions? (i.e. diabetes, asthma, this includes High risk factors for pregnancy, etc.) ---Yes List chronic conditions. ---GERD, PCOS Is the patient pregnant or possibly pregnant? (Ask all females between the ages of 33-55) ---No Is this a behavioral health or substance abuse call? ---No Guidelines Guideline Title Affirmed Question Affirmed Notes Nurse Date/Time (Eastern Time) Diarrhea [1] MILD diarrhea (e.g., 1-3 or more stools than normal in past 24 hours) without known cause AND [2] present > 7 days Lipps, RN, Langley Gauss 11/21/2020 11:40:07 PM PLEASE NOTE: All timestamps contained within this report are represented as [place] Standard Time. CONFIDENTIALTY NOTICE: This fax transmission is intended only for the addressee. It contains information that is legally privileged, confidential or otherwise protected from use or disclosure. If you are not the intended recipient, you are strictly prohibited from reviewing, disclosing, copying using or disseminating any of this information or taking any  action in reliance on or regarding this information. If you have received this fax in error, please notify us immediately by telephone so that we can arrange for its return to [place]. Phone: [phone], Toll-Free: [phone], Fax: [phone] Page: 2 of 2 Call Id: [id number] [place]. Time Eilene Ghazi Time) Disposition Final User 11/21/2020 11:49:22 PM SEE PCP WITHIN 3 DAYS Yes Lipps, RN, Langley Gauss

## 2020-11-22 NOTE — Telephone Encounter (Signed)
Pt called and lvm to return call to ask how pt is doing to see if she needs an appt

## 2020-11-29 ENCOUNTER — Ambulatory Visit: Payer: BC Managed Care – PPO | Admitting: Medical

## 2020-12-04 ENCOUNTER — Ambulatory Visit: Payer: BC Managed Care – PPO | Admitting: Medical

## 2020-12-06 ENCOUNTER — Ambulatory Visit: Payer: BC Managed Care – PPO | Admitting: Family Medicine

## 2020-12-07 NOTE — Progress Notes (Deleted)
Pt unable to make appointment. We will do right breast US.

## 2020-12-08 ENCOUNTER — Ambulatory Visit: Payer: BC Managed Care – PPO | Admitting: Medical

## 2020-12-08 VITALS — BP 119/70 | HR 85 | Resp 18 | Ht 61.0 in | Wt 220.0 lb

## 2020-12-08 DIAGNOSIS — H6123 Impacted cerumen, bilateral: Secondary | ICD-10-CM | POA: Diagnosis not present

## 2020-12-08 DIAGNOSIS — H9202 Otalgia, left ear: Secondary | ICD-10-CM

## 2020-12-08 MED ORDER — NEOMYCIN-POLYMYXIN-HC 3.5-10000-1 OT SOLN: 3.0000 [drp] | Freq: Four times a day (QID) | OTIC | 0 refills | Status: DC

## 2020-12-08 MED ORDER — AZITHROMYCIN 250 MG PO TABS: ORAL_TABLET | ORAL | 0 refills | Status: AC

## 2020-12-08 NOTE — Progress Notes (Signed)
Subjective:    Patient ID: Melanie Cruz, female    DOB: [birth year], 25 y.o.   MRN: [id number]  HPI  Pt had sore throat for 2-3 days. Pt states now resolved for more than a week. She mad appt for that but she kept reschduling and she got better.   Pt states mild left ear pain. On and off pain for 2 months. No recent nasal congestion. No fever, no chills and no sweats.   Pt had cerumen impaction in the past. She states when had prior lavage had pain.    Review of Systems  Constitutional:  Negative for chills, fatigue and fever.  HENT:  Positive for ear pain. Negative for congestion, hearing loss and mouth sores.   Respiratory:  Negative for cough, choking, shortness of breath and wheezing.   Cardiovascular:  Negative for chest pain and palpitations.  Gastrointestinal:  Negative for abdominal pain.  Musculoskeletal:  Negative for back pain.  Skin:  Negative for rash.  Neurological:  Negative for dizziness, seizures, light-headedness and numbness.  Hematological:  Negative for adenopathy. Does not bruise/bleed easily.  Psychiatric/Behavioral:  Negative for behavioral problems and confusion.     Past Medical History:  Diagnosis Date   COVID-19    Migraine      Social History   Socioeconomic History   Marital status: Single    Spouse name: Not on file   Number of children: Not on file   Years of education: Not on file   Highest education level: Not on file  Occupational History   Not on file  Tobacco Use   Smoking status: Every Day    Packs/day: 0.50    Years: 5.00    Pack years: 2.50    Types: Cigarettes   Smokeless tobacco: Never  Vaping Use   Vaping Use: Former  Substance and Sexual Activity   Alcohol use: Yes    Comment: occasionally    Drug use: Not Currently    Types: Marijuana    Comment: occasional-last use 05/23/2017   Sexual activity: Yes    Birth control/protection: Inserts    Comment: nuvaring   Other Topics Concern   Not on file  Social History  Narrative   Lives with roommate   Right handed   Caffeine: 0-1 cup/day   Social Determinants of Health   Financial Resource Strain: Not on file  Food Insecurity: Not on file  Transportation Needs: Not on file  Physical Activity: Not on file  Stress: Not on file  Social Connections: Not on file  Intimate Partner Violence: Not on file    Past Surgical History:  Procedure Laterality Date   NO PAST SURGERIES      Family History  Problem Relation Age of Onset   Hypertension Mother    Diabetes Mother    Breast cancer Paternal Aunt        age at onset 51's   Migraines Neg Hx     Allergies  Allergen Reactions   Cefdinir Other (See Comments)    Had sensation in throat after taking, was getting over a cold at the time.    Current Outpatient Medications on File Prior to Visit  Medication Sig Dispense Refill   busPIRone (BUSPAR) 7.5 MG tablet Take 1 tablet (7.5 mg total) by mouth 2 (two) times daily. 60 tablet 3   diclofenac (VOLTAREN) 75 MG EC tablet 1 tab po bid prn headache. 20 tablet 0   etonogestrel-ethinyl estradiol (NUVARING) 0.12-0.015 MG/24HR vaginal ring Insert vaginally  and leave in place for 3 consecutive weeks, then remove for 1 week. 3 each 3   famotidine (PEPCID) 20 MG tablet Take 1 tablet (20 mg total) by mouth daily. 30 tablet 0   medroxyPROGESTERone (PROVERA) 10 MG tablet Take 1 tablet (10 mg total) by mouth daily. 20 tablet 0   metFORMIN (GLUCOPHAGE) 500 MG tablet TAKE 1 TABLET (500 MG TOTAL) BY MOUTH 2 (TWO) TIMES DAILY WITH A MEAL. 180 tablet 3   triamcinolone (KENALOG) 0.025 % ointment Apply 1 application topically 2 (two) times daily. 30 g 0   No current facility-administered medications on file prior to visit.    BP 119/70   Pulse 85   Resp 18   Ht 5\' 1"  (1.549 m)   Wt 220 lb (99.8 kg)   SpO2 99%   BMI 41.57 kg/m       Objective:   Physical Exam  General- No acute distress. Pleasant patient. Neck- Full range of motion, no jvd Lungs- Clear,  even and unlabored. Heart- regular rate and rhythm. Neurologic- CNII- XII grossly intact.   Heent- no sinus pressure. Throat normal. Both canals moderate to severe wax. Left side can't see tm     Assessment & Plan:   Patient Instructions  Cerumen impact bilaterally with left ear pain on and off. I can't see your TM on left side.  Recommend azithromycin antibiotic and cortisporin otic drops. Also use debrox to soften wax on both sides.  Since pain in and off don't recommend lavage today in event of ear infection.  Follow up in 5-6 days for ear lavage. Or sooner if needed.   Mackie Pai, PA-C

## 2020-12-08 NOTE — Patient Instructions (Addendum)
Cerumen impact bilaterally with left ear pain on and off. I can't see your TM on left side.  Recommend azithromycin antibiotic and cortisporin otic drops(left side). Also use debrox to soften wax on both sides.  Since pain in and off don't recommend lavage today in event of ear infection.  Follow up in 5-6 days for ear lavage. Or sooner if needed.

## 2020-12-11 ENCOUNTER — Other Ambulatory Visit: Payer: Self-pay

## 2020-12-11 ENCOUNTER — Ambulatory Visit: Payer: BC Managed Care – PPO

## 2020-12-11 ENCOUNTER — Telehealth: Payer: Self-pay | Admitting: *Deleted

## 2020-12-11 ENCOUNTER — Other Ambulatory Visit (HOSPITAL_COMMUNITY)
Admission: RE | Admit: 2020-12-11 | Discharge: 2020-12-11 | Disposition: A | Discharge status: Home / Self Care | Payer: BC Managed Care – PPO | Source: Ambulatory Visit | Attending: Obstetrics & Gynecology | Admitting: Obstetrics & Gynecology

## 2020-12-11 VITALS — BP 114/67 | HR 75 | Ht 64.0 in | Wt 221.0 lb

## 2020-12-11 DIAGNOSIS — N898 Other specified noninflammatory disorders of vagina: Secondary | ICD-10-CM | POA: Insufficient documentation

## 2020-12-11 DIAGNOSIS — Z113 Encounter for screening for infections with a predominantly sexual mode of transmission: Secondary | ICD-10-CM | POA: Diagnosis not present

## 2020-12-11 NOTE — Telephone Encounter (Signed)
Contact Type Call Who Is Calling Patient / Member / Family / Caregiver Call Type Triage / Clinical Relationship To Patient Self Return Phone Number [phone] (Primary) Chief Complaint Medication reaction Reason for Call Symptomatic / Request for Health Information Initial Comment caller thinks she's having reaction to medication taken/ she has an irritated throat and restless/ sleepless caller states she was given medication for her ear ( Azithromycin ) Translation No Nurse Assessment Nurse: Quincy Simmonds, RN, Annita Brod Date/Time (Eastern Time): 12/09/2020 9:39:46 AM Confirm and document reason for call. If symptomatic, describe symptoms. ---caller thinks she's having reaction to medication taken/ she has an irritated throat and restless/sleepless, caller states she was given medication for her ear ( Azithromycin ) Does the patient have any new or worsening symptoms? ---Yes Sore Throat [1] Sore throat is the only symptom AND [2] sore throat present < 48 hours Quincy Simmonds, RN, Annita Brod 12/09/2020 9:41:00 AM Disp. Time Eilene Ghazi Time) Disposition Final User 12/09/2020 [place] Yes Quincy Simmonds, RN, Annita Brod

## 2020-12-11 NOTE — Telephone Encounter (Signed)
Called to check the status of patient, but she stated that she needed to call us back.

## 2020-12-11 NOTE — Progress Notes (Addendum)
SUBJECTIVE:  25 y.o. female complains of white vaginal discharge for 1 week(s). Denies abnormal vaginal bleeding or significant pelvic pain or fever. No UTI symptoms. Denies history of known exposure to STD.    OBJECTIVE:  She appears well, afebrile.   ASSESSMENT:  Vaginal Discharge  Patient states she has one new sexual partner.    PLAN:  GC, chlamydia, trichomonas, BVAG, CVAG probe sent to lab. Treatment: To be determined once lab results are received ROV prn if symptoms persist or worsen. Patient is scheduled for provider visit on Wednesday Dec 7th, 2022. Kathrene Alu RN   Attestation of Attending Supervision of RN: Evaluation and management procedures were performed by the nurse under my supervision and collaboration.  I have reviewed the nursing note and chart, and I agree with the management and plan.  Carolyn L. Harraway-Smith, M.D., Cherlynn June

## 2020-12-11 NOTE — Telephone Encounter (Signed)
It states that patient was given home care but also to call us.  No call from patient this morning.  Would you like for her to discontinue? Call was taken on 12/3.

## 2020-12-12 LAB — CERVICOVAGINAL ANCILLARY ONLY
Bacterial Vaginitis (gardnerella): POSITIVE — AB
Candida Glabrata: NEGATIVE
Candida Vaginitis: POSITIVE — AB
Chlamydia: NEGATIVE
Comment: NEGATIVE
Comment: NEGATIVE
Comment: NEGATIVE
Comment: NEGATIVE
Comment: NEGATIVE
Comment: NORMAL
Neisseria Gonorrhea: NEGATIVE
Trichomonas: NEGATIVE

## 2020-12-13 ENCOUNTER — Ambulatory Visit: Payer: BC Managed Care – PPO | Admitting: Obstetrics and Gynecology

## 2020-12-13 ENCOUNTER — Ambulatory Visit: Payer: BC Managed Care – PPO | Admitting: Medical

## 2020-12-13 ENCOUNTER — Other Ambulatory Visit: Payer: Self-pay | Admitting: Obstetrics and Gynecology

## 2020-12-13 ENCOUNTER — Other Ambulatory Visit: Payer: Self-pay

## 2020-12-13 DIAGNOSIS — N6310 Unspecified lump in the right breast, unspecified quadrant: Secondary | ICD-10-CM

## 2020-12-13 MED ORDER — FLUCONAZOLE 150 MG PO TABS: 150.0000 mg | ORAL_TABLET | Freq: Once | ORAL | 1 refills | Status: AC

## 2020-12-13 MED ORDER — METRONIDAZOLE 500 MG PO TABS: 500.0000 mg | ORAL_TABLET | Freq: Two times a day (BID) | ORAL | 0 refills | Status: DC

## 2020-12-13 NOTE — Progress Notes (Signed)
Patient called to make aware of BV and yeast infection and medications sent to her pharmacy. Patient mailbox full. Kathrene Alu RN

## 2020-12-19 ENCOUNTER — Ambulatory Visit: Payer: BC Managed Care – PPO | Admitting: Medical

## 2020-12-25 ENCOUNTER — Encounter: Payer: Self-pay | Admitting: Family Medicine

## 2020-12-25 ENCOUNTER — Encounter: Payer: Self-pay | Admitting: Medical

## 2020-12-26 ENCOUNTER — Ambulatory Visit: Payer: BC Managed Care – PPO | Admitting: Family

## 2020-12-27 ENCOUNTER — Encounter: Payer: Self-pay | Admitting: Medical

## 2020-12-27 ENCOUNTER — Ambulatory Visit (INDEPENDENT_AMBULATORY_CARE_PROVIDER_SITE_OTHER): Payer: BC Managed Care – PPO | Admitting: Medical

## 2020-12-27 VITALS — BP 110/46 | HR 78 | Resp 18 | Ht 64.0 in | Wt 222.6 lb

## 2020-12-27 DIAGNOSIS — Z3490 Encounter for supervision of normal pregnancy, unspecified, unspecified trimester: Secondary | ICD-10-CM

## 2020-12-27 DIAGNOSIS — R768 Other specified abnormal immunological findings in serum: Secondary | ICD-10-CM

## 2020-12-27 DIAGNOSIS — M2550 Pain in unspecified joint: Secondary | ICD-10-CM

## 2020-12-27 DIAGNOSIS — R21 Rash and other nonspecific skin eruption: Secondary | ICD-10-CM

## 2020-12-27 DIAGNOSIS — R739 Hyperglycemia, unspecified: Secondary | ICD-10-CM

## 2020-12-27 DIAGNOSIS — H6123 Impacted cerumen, bilateral: Secondary | ICD-10-CM

## 2020-12-27 MED ORDER — TRIAMCINOLONE ACETONIDE 0.025 % EX OINT: 1.0000 "application " | TOPICAL_OINTMENT | Freq: Two times a day (BID) | CUTANEOUS | 0 refills | Status: DC

## 2020-12-27 NOTE — Progress Notes (Signed)
Subjective:    Patient ID: Melanie Cruz, female    DOB: [birth year], 25 y.o.   MRN: [id number]  HPI  Pt in for some itching to both hands and feet. Pt states this has been occurring since the summer.    Pt states occasionally will get joint pain. Most recently had rt second digit mcp joint area pain briefly past Friday but no pain presently.  Pt had ear pain with cerumen impaction on lat visit. She used zpack, cortisporin otic and debrox. To both sides.  Pt states she is also pregnant. LMP- 11-14-20. Pt saw Dr. Nehemiah Settle last time. She had abortion last time. This time will go thru with pregnancy. Appointment with Dr. Nehemiah Settle on January 23, 2020.    Review of Systems  Constitutional:  Negative for chills, fatigue and fever.  HENT:  Negative for congestion and drooling.   Respiratory:  Negative for cough, chest tightness, shortness of breath and wheezing.   Cardiovascular:  Negative for chest pain and palpitations.  Gastrointestinal:  Positive for nausea. Negative for abdominal pain and diarrhea.  Genitourinary:  Negative for dysuria and flank pain.  Musculoskeletal:  Negative for back pain, gait problem and neck pain.  Skin:  Positive for rash.  Neurological:  Negative for dizziness.  Hematological:  Negative for adenopathy. Does not bruise/bleed easily.  Psychiatric/Behavioral:  Negative for behavioral problems, decreased concentration and dysphoric mood. The patient is not nervous/anxious and is not hyperactive.     Past Medical History:  Diagnosis Date   COVID-19    Migraine      Social History   Socioeconomic History   Marital status: Single    Spouse name: Not on file   Number of children: Not on file   Years of education: Not on file   Highest education level: Not on file  Occupational History   Not on file  Tobacco Use   Smoking status: Every Day    Packs/day: 0.50    Years: 5.00    Pack years: 2.50    Types: Cigarettes   Smokeless tobacco: Never  Vaping  Use   Vaping Use: Former  Substance and Sexual Activity   Alcohol use: Yes    Comment: occasionally    Drug use: Not Currently    Types: Marijuana    Comment: occasional-last use 05/23/2017   Sexual activity: Yes    Birth control/protection: Inserts    Comment: nuvaring   Other Topics Concern   Not on file  Social History Narrative   Lives with roommate   Right handed   Caffeine: 0-1 cup/day   Social Determinants of Health   Financial Resource Strain: Not on file  Food Insecurity: Not on file  Transportation Needs: Not on file  Physical Activity: Not on file  Stress: Not on file  Social Connections: Not on file  Intimate Partner Violence: Not on file    Past Surgical History:  Procedure Laterality Date   NO PAST SURGERIES      Family History  Problem Relation Age of Onset   Hypertension Mother    Diabetes Mother    Breast cancer Paternal Aunt        age at onset 77's   Migraines Neg Hx     Allergies  Allergen Reactions   Cefdinir Other (See Comments)    Had sensation in throat after taking, was getting over a cold at the time.    Current Outpatient Medications on File Prior to Visit  Medication Sig Dispense  Refill   busPIRone (BUSPAR) 7.5 MG tablet Take 1 tablet (7.5 mg total) by mouth 2 (two) times daily. 60 tablet 3   diclofenac (VOLTAREN) 75 MG EC tablet 1 tab po bid prn headache. 20 tablet 0   etonogestrel-ethinyl estradiol (NUVARING) 0.12-0.015 MG/24HR vaginal ring Insert vaginally and leave in place for 3 consecutive weeks, then remove for 1 week. 3 each 3   famotidine (PEPCID) 20 MG tablet Take 1 tablet (20 mg total) by mouth daily. 30 tablet 0   fluticasone (FLONASE) 50 MCG/ACT nasal spray Place into the nose.     medroxyPROGESTERone (PROVERA) 10 MG tablet Take 1 tablet (10 mg total) by mouth daily. 20 tablet 0   metFORMIN (GLUCOPHAGE) 500 MG tablet TAKE 1 TABLET (500 MG TOTAL) BY MOUTH 2 (TWO) TIMES DAILY WITH A MEAL. 180 tablet 3   metroNIDAZOLE  (FLAGYL) 500 MG tablet Take 1 tablet (500 mg total) by mouth 2 (two) times daily. 14 tablet 0   neomycin-polymyxin-hydrocortisone (CORTISPORIN) OTIC solution Place 3 drops into the left ear 4 (four) times daily. 10 mL 0   triamcinolone (KENALOG) 0.025 % ointment Apply 1 application topically 2 (two) times daily. 30 g 0   No current facility-administered medications on file prior to visit.    BP (!) 110/46    Pulse 78    Resp 18    Ht 5\' 4"  (1.626 m)    Wt 222 lb 9.6 oz (101 kg)    SpO2 100%    BMI 38.21 kg/m       Objective:   Physical Exam  General Mental Status- Alert. General Appearance- Not in acute distress.   Skin General: Color- Normal Color. Moisture- Normal Moisture.  Neck Carotid Arteries- Normal color. Moisture- Normal Moisture. No carotid bruits. No JVD.  Chest and Lung Exam Auscultation: Breath Sounds:-Normal.  Cardiovascular Auscultation:Rythm- Regular. Murmurs & Other Heart Sounds:Auscultation of the heart reveals- No Murmurs.  Abdomen Inspection:-Inspeection Normal. Palpation/Percussion:Note:No mass. Palpation and Percussion of the abdomen reveal- Non Tender, Non Distended + BS, no rebound or guarding.   Neurologic Cranial Nerve exam:- CN III-XII intact(No nystagmus), symmetric smile. Strength:- 5/5 equal and symmetric strength both upper and lower extremities.   Ears-bilateral cerumen impaction present. Left side post lavage cleared wax and tm norma.. Rt side post lavage 70% wax removed. Could see tm and normal.  Skin-patient has hyperpigmentation of the MCP PIP and DIP joints of both hands.  Also hyperpigmentation on the tops of both feet.  Slight swelling appears to all digits.     Assessment & Plan:   Patient Instructions  Bilateral cerumen impaction.  Medical assistant attempted lavage today.   Arthralgias occurring intermittently in hands.  Will get inflammatory lab studies.  Also rash present on both hands and feet.  Also history of  hyperpigmentation of elbows.  Considering eczema versus psoriasis.  Recommend using moisturizer twice daily, Dove moisturizing body soap and providing Kenalog topical steroid to apply twice daily for areas that are itching significantly.  After review of labs will likely go ahead and place referral to dermatologist.  If inflammatory labs come back positive consider referral to rheumatologist as well.  Recently pregnant.  You have upcoming appointment with her gynecologist on January 16.  You mention prior to this she had small lump near your nipple for which gynecologist office placed order for mammogram.  At that time the order was placed GYN office did not know you were  pregnant.  I would recommend that you call  their office and remind them that you are pregnant.  Do they want you to still go through with a mammogram?  Follow-up in 2 weeks or sooner if needed.

## 2020-12-27 NOTE — Patient Instructions (Addendum)
Bilateral cerumen impaction.  Medical assistant attempted lavage today. Left side cleared. Rt side mild wax left. If you want me to refer to ent later just let me know and will refer.   Arthralgias occurring intermittently in hands.  Will get inflammatory lab studies.  Also rash present on both hands and feet.  Also history of hyperpigmentation of elbows.  Considering eczema versus psoriasis.  Recommend using moisturizer twice daily, Dove moisturizing body soap and providing Kenalog topical steroid to apply twice daily for areas that are itching significantly.  After review of labs will likely go ahead and place referral to dermatologist.  If inflammatory labs come back positive consider referral to rheumatologist as well.  Recently pregnant.  You have upcoming appointment with her gynecologist on January 16.  You mention prior to this she had small lump near your nipple for which gynecologist office placed order for mammogram.  At that time the order was placed GYN office did not know you were  pregnant.  I would recommend that you call their office and remind them that you are pregnant.  Do they want you to still go through with a mammogram?  Elevated sugar in the past.  Will get A1c today.  Follow-up in 2 weeks or sooner if needed.

## 2020-12-28 LAB — C-REACTIVE PROTEIN: CRP: <1 mg/dL (ref 0.5–20.0)

## 2020-12-28 LAB — HEMOGLOBIN A1C: Hgb A1c MFr Bld: 6.1 % (ref 4.6–6.5)

## 2020-12-28 LAB — SEDIMENTATION RATE: Sed Rate: 34 mm/hr — ABNORMAL HIGH (ref 0–20)

## 2020-12-30 LAB — ANA: Anti Nuclear Antibody (ANA): POSITIVE — AB

## 2020-12-30 LAB — ANTI-NUCLEAR AB-TITER (ANA TITER): ANA Titer 1: 1:40 {titer} — ABNORMAL HIGH

## 2020-12-30 LAB — RHEUMATOID FACTOR: Rheumatoid fact SerPl-aCnc: <14 IU/mL (ref <14)

## 2021-01-01 ENCOUNTER — Encounter: Payer: Self-pay | Admitting: Medical

## 2021-01-03 ENCOUNTER — Encounter: Payer: Self-pay | Admitting: Family Medicine

## 2021-01-03 ENCOUNTER — Encounter: Payer: Self-pay | Admitting: Medical

## 2021-01-03 NOTE — Telephone Encounter (Signed)
Patient would like the referral for Lupus work up , please advise

## 2021-01-03 NOTE — Addendum Note (Signed)
Addended by: Anabel Halon on: 01/03/2021 10:43 PM   Modules accepted: Orders

## 2021-01-03 NOTE — Telephone Encounter (Signed)
Was th discussed at last OV? I can not find it in last notes, do you recommend restarting the anxiety medication ?

## 2021-01-12 ENCOUNTER — Ambulatory Visit
Admission: RE | Admit: 2021-01-12 | Discharge: 2021-01-12 | Disposition: A | Discharge status: Home / Self Care | Payer: BC Managed Care – PPO | Source: Ambulatory Visit | Attending: Obstetrics and Gynecology | Admitting: Obstetrics and Gynecology

## 2021-01-12 DIAGNOSIS — N6310 Unspecified lump in the right breast, unspecified quadrant: Secondary | ICD-10-CM

## 2021-01-12 DIAGNOSIS — N6489 Other specified disorders of breast: Secondary | ICD-10-CM | POA: Diagnosis not present

## 2021-01-25 ENCOUNTER — Encounter: Payer: Self-pay | Admitting: Family Medicine

## 2021-01-25 ENCOUNTER — Other Ambulatory Visit (HOSPITAL_COMMUNITY)
Admission: RE | Admit: 2021-01-25 | Discharge: 2021-01-25 | Disposition: A | Discharge status: Home / Self Care | Payer: BC Managed Care – PPO | Source: Ambulatory Visit | Attending: Family Medicine | Admitting: Family Medicine

## 2021-01-25 ENCOUNTER — Other Ambulatory Visit: Payer: Self-pay

## 2021-01-25 ENCOUNTER — Ambulatory Visit (INDEPENDENT_AMBULATORY_CARE_PROVIDER_SITE_OTHER): Payer: BC Managed Care – PPO | Admitting: Family Medicine

## 2021-01-25 VITALS — BP 105/65 | HR 68 | Wt 225.0 lb

## 2021-01-25 DIAGNOSIS — Z6838 Body mass index (BMI) 38.0-38.9, adult: Secondary | ICD-10-CM

## 2021-01-25 DIAGNOSIS — B9689 Other specified bacterial agents as the cause of diseases classified elsewhere: Secondary | ICD-10-CM | POA: Diagnosis not present

## 2021-01-25 DIAGNOSIS — Z3490 Encounter for supervision of normal pregnancy, unspecified, unspecified trimester: Secondary | ICD-10-CM | POA: Diagnosis not present

## 2021-01-25 DIAGNOSIS — Z3480 Encounter for supervision of other normal pregnancy, unspecified trimester: Secondary | ICD-10-CM

## 2021-01-25 DIAGNOSIS — M329 Systemic lupus erythematosus, unspecified: Secondary | ICD-10-CM

## 2021-01-25 DIAGNOSIS — O0990 Supervision of high risk pregnancy, unspecified, unspecified trimester: Secondary | ICD-10-CM | POA: Insufficient documentation

## 2021-01-25 DIAGNOSIS — E282 Polycystic ovarian syndrome: Secondary | ICD-10-CM

## 2021-01-25 DIAGNOSIS — N760 Acute vaginitis: Secondary | ICD-10-CM | POA: Insufficient documentation

## 2021-01-25 DIAGNOSIS — R7303 Prediabetes: Secondary | ICD-10-CM

## 2021-01-25 NOTE — Progress Notes (Signed)
Subjective:  Melanie Cruz is a G3P1011 [redacted]w[redacted]d being seen today for her first obstetrical visit.  Her obstetrical history is significant for obesity and 1 prior SVD. This is an unplanned pregnancy. Thought about getting an IAB, but decided that she didn't want to go through that . Patient does intend to breast feed. Pregnancy history fully reviewed.  Patient reports nausea and vomiting.  BP 105/65    Pulse 68    Wt 225 lb (102.1 kg)    LMP 11/14/2020 (Exact Date)    BMI 38.62 kg/m   HISTORY: OB History  Gravida Para Term Preterm AB Living  3 1 1   1 1   SAB IAB Ectopic Multiple Live Births        0 1    # Outcome Date GA Lbr Len/2nd Weight Sex Delivery Anes PTL Lv  3 Current           2 AB 08/2020          1 Term 09/09/17 [redacted]w[redacted]d / 00:46 7 lb 11.8 oz (3.51 kg) M Vag-Spont EPI  LIV    Past Medical History:  Diagnosis Date   COVID-19    Migraine     Past Surgical History:  Procedure Laterality Date   NO PAST SURGERIES      Family History  Problem Relation Age of Onset   Hypertension Mother    Diabetes Mother    Breast cancer Paternal Aunt        age at onset 64's   Migraines Neg Hx      Exam  BP 105/65    Pulse 68    Wt 225 lb (102.1 kg)    LMP 11/14/2020 (Exact Date)    BMI 38.62 kg/m   Chaperone present during exam  CONSTITUTIONAL: Well-developed, well-nourished female in no acute distress.  HENT:  Normocephalic, atraumatic, External right and left ear normal. Oropharynx is clear and moist EYES: Conjunctivae and EOM are normal. Pupils are equal, round, and reactive to light. No scleral icterus.  NECK: Normal range of motion, supple, no masses.  Normal thyroid.  CARDIOVASCULAR: Normal heart rate noted, regular rhythm RESPIRATORY: Clear to auscultation bilaterally. Effort and breath sounds normal, no problems with respiration noted. BREASTS: Symmetric in size. No masses, skin changes, nipple drainage, or lymphadenopathy. ABDOMEN: Soft, normal bowel sounds, no  distention noted.  No tenderness, rebound or guarding.  PELVIC: Normal appearing external genitalia; normal appearing vaginal mucosa and cervix. No abnormal discharge noted. Normal uterine size, no other palpable masses, no uterine or adnexal tenderness. MUSCULOSKELETAL: Normal range of motion. No tenderness.  No cyanosis, clubbing, or edema.  2+ distal pulses. SKIN: Skin is warm and dry. No rash noted. Not diaphoretic. No erythema. No pallor. NEUROLOGIC: Alert and oriented to person, place, and time. Normal reflexes, muscle tone coordination. No cranial nerve deficit noted. PSYCHIATRIC: Normal mood and affect. Normal behavior. Normal judgment and thought content.    Assessment:    Pregnancy: G3P1011 Patient Active Problem List   Diagnosis Date Noted   Supervision of other normal pregnancy, antepartum 01/25/2021   Diarrhea 11/07/2020   PCOS (polycystic ovarian syndrome) 09/29/2020   Prediabetes 04/06/2020   Migraine without aura and without status migrainosus, not intractable 03/06/2020   Tension type headache 03/06/2020   Avulsion fracture of tibial tuberosity 02/25/2020   Chronic rhinitis 03/25/2019   Furunculosis of multiple sites 04/15/2017   Tobacco abuse counseling 03/13/2017      Plan:   1. Supervision of other normal  pregnancy, antepartum  - CBC/D/Plt+RPR+Rh+ABO+RubIgG... - Hemoglobin A1c - Cervicovaginal ancillary only( [place]) - [facility] AMB BABYSCRIPTS OPT IN - Lupus anticoagulant panel - Sjogrens syndrome-A extractable nuclear antibody - Sjogrens syndrome-B extractable nuclear antibody  2. Prediabetes  - Hemoglobin A1c  3. PCOS (polycystic ovarian syndrome)   4. Systemic lupus erythematosus, unspecified SLE type, unspecified organ involvement status ([place]) Had positive ANA. Having some joint and some skin problems. Has a referral to Rheumatology in March. Will check SSA/SSB antibodies and lupus anticoag panel. - Lupus anticoagulant panel - Sjogrens  syndrome-A extractable nuclear antibody - Sjogrens syndrome-B extractable nuclear antibody  5. BMI 38.0-38.9,adult ASA 81mg  at 12 weeks    Initial labs obtained Continue prenatal vitamins Reviewed n/v relief measures and warning s/s to report Reviewed recommended weight gain based on pre-gravid BMI Encouraged well-balanced diet Genetic & carrier screening discussed: Uncertain about NIPS. Ultrasound discussed; fetal survey: requested [place] completed> form faxed if has or is planning to apply for medicaid The nature of [place] for [employer] with multiple MDs and other [facility] Providers was explained to patient; also emphasized that fellows, residents, and students are part of our team.   Problem list reviewed and updated. 75% of 30 min visit spent on counseling and coordination of care.     Truett Mainland 01/25/2021

## 2021-01-25 NOTE — Addendum Note (Signed)
Addended by: Phill Myron on: 01/25/2021 02:14 PM   Modules accepted: Orders

## 2021-01-25 NOTE — Progress Notes (Signed)
DATING AND VIABILITY SONOGRAM   Melanie Cruz is a 26 y.o. year old G60P1011 with LMP Patient's last menstrual period was 11/14/2020 (exact date). which would correlate to  [redacted]w[redacted]d weeks gestation.  She has regular menstrual cycles.   She is here today for a confirmatory initial sonogram.    GESTATION: SINGLETON     FETAL ACTIVITY:          Heart rate         153 bpm          The fetus is active.     GESTATIONAL AGE AND  BIOMETRICS:  Gestational criteria: Estimated Date of Delivery: 08/21/21 by LMP now at [redacted]w[redacted]d  Previous Scans:0  CROWN RUMP LENGTH           2.68 cm         9-4 weeks                                                                               AVERAGE EGA(BY THIS SCAN):  9-4 weeks  WORKING EDD( LMP ):  08/21/2021     TECHNICIAN COMMENTS: Patient informed that the ultrasound is considered a limited obstetric ultrasound and is not intended to be a complete ultrasound exam.  Patient also informed that the ultrasound is not being completed with the intent of assessing for fetal or placental anomalies or any pelvic abnormalities. Explained that the purpose of today's ultrasound is to assess for fetal heart rate.  Patient acknowledges the purpose of the exam and the limitations of the study.     Kathrene Alu 01/25/2021 1:48 PM

## 2021-01-26 LAB — COMPREHENSIVE METABOLIC PANEL
ALT: 19 IU/L (ref 0–32)
AST: 13 IU/L (ref 0–40)
Albumin/Globulin Ratio: 1.7 (ref 1.2–2.2)
Albumin: 4.3 g/dL (ref 3.9–5.0)
Alkaline Phosphatase: 48 IU/L (ref 44–121)
BUN/Creatinine Ratio: 11 (ref 9–23)
BUN: 7 mg/dL (ref 6–20)
Bilirubin Total: <0.2 mg/dL (ref 0.0–1.2)
CO2: 20 mmol/L (ref 20–29)
Calcium: 9.3 mg/dL (ref 8.7–10.2)
Chloride: 106 mmol/L (ref 96–106)
Creatinine, Ser: 0.62 mg/dL (ref 0.57–1.00)
Globulin, Total: 2.6 g/dL (ref 1.5–4.5)
Glucose: 76 mg/dL (ref 70–99)
Potassium: 3.9 mmol/L (ref 3.5–5.2)
Sodium: 139 mmol/L (ref 134–144)
Total Protein: 6.9 g/dL (ref 6.0–8.5)
eGFR: 125 mL/min/{1.73_m2} (ref >59)

## 2021-01-27 LAB — CBC/D/PLT+RPR+RH+ABO+RUBIGG...
Antibody Screen: NEGATIVE
Basophils Absolute: 0 10*3/uL (ref 0.0–0.2)
Basos: 1 %
EOS (ABSOLUTE): 0.1 10*3/uL (ref 0.0–0.4)
Eos: 1 %
HCV Ab: <0.1 s/co ratio (ref 0.0–0.9)
HIV Screen 4th Generation wRfx: NONREACTIVE
Hematocrit: 37.9 % (ref 34.0–46.6)
Hemoglobin: 12 g/dL (ref 11.1–15.9)
Hepatitis B Surface Ag: NEGATIVE
Immature Grans (Abs): 0 10*3/uL (ref 0.0–0.1)
Immature Granulocytes: 0 %
Lymphocytes Absolute: 2.5 10*3/uL (ref 0.7–3.1)
Lymphs: 33 %
MCH: 24.7 pg — ABNORMAL LOW (ref 26.6–33.0)
MCHC: 31.7 g/dL (ref 31.5–35.7)
MCV: 78 fL — ABNORMAL LOW (ref 79–97)
Monocytes Absolute: 0.6 10*3/uL (ref 0.1–0.9)
Monocytes: 7 %
Neutrophils Absolute: 4.4 10*3/uL (ref 1.4–7.0)
Neutrophils: 58 %
Platelets: 209 10*3/uL (ref 150–450)
RBC: 4.86 x10E6/uL (ref 3.77–5.28)
RDW: 13.7 % (ref 11.7–15.4)
RPR Ser Ql: NONREACTIVE
Rh Factor: POSITIVE
Rubella Antibodies, IGG: 1.9 index (ref >0.99)
WBC: 7.6 10*3/uL (ref 3.4–10.8)

## 2021-01-27 LAB — CULTURE, OB URINE

## 2021-01-27 LAB — SJOGRENS SYNDROME-B EXTRACTABLE NUCLEAR ANTIBODY: ENA SSB (LA) Ab: <0.2 AI (ref 0.0–0.9)

## 2021-01-27 LAB — URINE CULTURE, OB REFLEX

## 2021-01-27 LAB — HCV INTERPRETATION

## 2021-01-27 LAB — LUPUS ANTICOAGULANT PANEL
Dilute Viper Venom Time: 32.2 s (ref 0.0–47.0)
PTT Lupus Anticoagulant: 33.4 s (ref 0.0–51.9)

## 2021-01-27 LAB — HEMOGLOBIN A1C
Est. average glucose Bld gHb Est-mCnc: 126 mg/dL
Hgb A1c MFr Bld: 6 % — ABNORMAL HIGH (ref 4.8–5.6)

## 2021-01-27 LAB — SJOGRENS SYNDROME-A EXTRACTABLE NUCLEAR ANTIBODY: ENA SSA (RO) Ab: <0.2 AI (ref 0.0–0.9)

## 2021-01-29 LAB — CERVICOVAGINAL ANCILLARY ONLY
Bacterial Vaginitis (gardnerella): POSITIVE — AB
Candida Glabrata: NEGATIVE
Candida Vaginitis: NEGATIVE
Chlamydia: NEGATIVE
Comment: NEGATIVE
Comment: NEGATIVE
Comment: NEGATIVE
Comment: NEGATIVE
Comment: NORMAL
Neisseria Gonorrhea: NEGATIVE

## 2021-01-30 ENCOUNTER — Telehealth: Payer: Self-pay

## 2021-01-30 DIAGNOSIS — O219 Vomiting of pregnancy, unspecified: Secondary | ICD-10-CM

## 2021-01-30 DIAGNOSIS — B9689 Other specified bacterial agents as the cause of diseases classified elsewhere: Secondary | ICD-10-CM

## 2021-01-30 MED ORDER — PROMETHAZINE HCL 25 MG PO TABS: 25.0000 mg | ORAL_TABLET | Freq: Four times a day (QID) | ORAL | 1 refills | Status: DC | PRN

## 2021-01-30 MED ORDER — METRONIDAZOLE 500 MG PO TABS: 500.0000 mg | ORAL_TABLET | Freq: Two times a day (BID) | ORAL | 0 refills | Status: DC

## 2021-01-30 NOTE — Telephone Encounter (Signed)
Called pt to inform her of positive BV results. Pt requests a Rx for nausea and vomiting. Flagyl 500 mg BID x 7 days and Phenergan 25 mg 1 tablet PO q6h was sent to her pharmacy. Understanding was voiced. Rogene Meth l Rheda Kassab, CMA

## 2021-02-01 ENCOUNTER — Encounter: Payer: Self-pay | Admitting: Family Medicine

## 2021-02-01 ENCOUNTER — Encounter: Payer: Self-pay | Admitting: General Practice

## 2021-02-07 ENCOUNTER — Ambulatory Visit (INDEPENDENT_AMBULATORY_CARE_PROVIDER_SITE_OTHER): Payer: BC Managed Care – PPO

## 2021-02-07 ENCOUNTER — Other Ambulatory Visit: Payer: Self-pay

## 2021-02-07 DIAGNOSIS — Z3A12 12 weeks gestation of pregnancy: Secondary | ICD-10-CM

## 2021-02-07 DIAGNOSIS — R7309 Other abnormal glucose: Secondary | ICD-10-CM

## 2021-02-07 NOTE — Progress Notes (Signed)
Pt presents for early 2 hr GTT due to elevated Hemoglobin A1C. Pt was given lab slip and sent to the lab. Madylin Fairbank l Kailany Dinunzio, CMA

## 2021-02-08 LAB — GLUCOSE TOLERANCE, 2 HOURS W/ 1HR
Glucose, 1 hour: 181 mg/dL — ABNORMAL HIGH (ref 70–179)
Glucose, 2 hour: 117 mg/dL (ref 70–152)
Glucose, Fasting: 88 mg/dL (ref 70–91)

## 2021-02-09 ENCOUNTER — Other Ambulatory Visit: Payer: Self-pay

## 2021-02-09 DIAGNOSIS — R7309 Other abnormal glucose: Secondary | ICD-10-CM

## 2021-02-09 DIAGNOSIS — O24410 Gestational diabetes mellitus in pregnancy, diet controlled: Secondary | ICD-10-CM

## 2021-02-09 NOTE — Progress Notes (Signed)
Patient called and made aware of diagnosis of gestational diabetes and need for class. Referral placed. Kathrene Alu RN

## 2021-02-16 ENCOUNTER — Encounter: Payer: Self-pay | Admitting: Medical

## 2021-02-20 ENCOUNTER — Encounter: Payer: BC Managed Care – PPO | Attending: Family Medicine | Admitting: Registered"

## 2021-02-20 ENCOUNTER — Ambulatory Visit (INDEPENDENT_AMBULATORY_CARE_PROVIDER_SITE_OTHER): Payer: BC Managed Care – PPO | Admitting: Registered"

## 2021-02-20 ENCOUNTER — Other Ambulatory Visit: Payer: Self-pay

## 2021-02-20 DIAGNOSIS — O24410 Gestational diabetes mellitus in pregnancy, diet controlled: Secondary | ICD-10-CM | POA: Diagnosis not present

## 2021-02-20 DIAGNOSIS — Z3A00 Weeks of gestation of pregnancy not specified: Secondary | ICD-10-CM | POA: Insufficient documentation

## 2021-02-20 DIAGNOSIS — Z713 Dietary counseling and surveillance: Secondary | ICD-10-CM | POA: Diagnosis not present

## 2021-02-20 DIAGNOSIS — O24419 Gestational diabetes mellitus in pregnancy, unspecified control: Secondary | ICD-10-CM

## 2021-02-20 NOTE — Progress Notes (Signed)
Patient was seen for Gestational Diabetes self-management on 02/20/21  Start time 1020 and End time 1120   Estimated due date: 08/21/21; [redacted]w[redacted]d  Clinical: Medications: reviewed Medical History: pre-diabetes Labs: OGTT 181, A1c 6.0%   Dietary and Lifestyle History: Pt states was in prediabetes range last year and brought into normal range by end of year by avoiding starch and was eating mostly meat, vegetables and fruit. But with pregnancy started having cravings and eating more sweets.   Pt states she has taken metformin in the past.  Physical Activity: not assessed Stress: not assessed Sleep: not assessed  24 hr Recall:  First Meal: sausage egg cheese biscuit grape jelly OR gris OR 2 oranges, water or juice Snack: Second meal: hamburger stead, gravy, rice, cabbage, cornbread, fat back, ginger ale Snack: Third meal: crab boil with corn sausage, eggs, potatoes Snack: Beverages: water 20-40 oz, ginger ale, juice  NUTRITION INTERVENTION  Nutrition education (E-1) on the following topics:   Initial Follow-up  [x]  []  Definition of Gestational Diabetes [x]  []  Why dietary management is important in controlling blood glucose [x]  []  Effects each nutrient has on blood glucose levels [x]  []  Simple carbohydrates vs complex carbohydrates [x]  []  Fluid intake [x]  []  Creating a balanced meal plan [x]  []  Carbohydrate counting  [x]  []  When to check blood glucose levels [x]  []  Proper blood glucose monitoring techniques [x]  []  Effect of stress and stress reduction techniques  [x]  []  Exercise effect on blood glucose levels, appropriate exercise during pregnancy [x]  []  Importance of limiting caffeine and abstaining from alcohol and smoking [x]  []  Medications used for blood sugar control during pregnancy [x]  []  Hypoglycemia and rule of 15 [x]  []  Postpartum self care  Patient already has a meter, is testing pre breakfast and 2 hours after each meal.  Patient instructed to monitor glucose  levels: FBS: 60 - ? 95 mg/dL (some clinics use 90 for cutoff) 1 hour: ? 140 mg/dL 2 hour: ? 120 mg/dL  Patient received handouts: Nutrition Diabetes and Pregnancy Carbohydrate Counting List A1c chart  Patient will be seen for follow-up as needed.

## 2021-02-21 DIAGNOSIS — O24419 Gestational diabetes mellitus in pregnancy, unspecified control: Secondary | ICD-10-CM | POA: Insufficient documentation

## 2021-02-21 HISTORY — DX: Gestational diabetes mellitus in pregnancy, unspecified control: O24.419

## 2021-02-23 ENCOUNTER — Other Ambulatory Visit: Payer: Self-pay

## 2021-02-23 ENCOUNTER — Ambulatory Visit (INDEPENDENT_AMBULATORY_CARE_PROVIDER_SITE_OTHER): Payer: BC Managed Care – PPO | Admitting: Family Medicine

## 2021-02-23 VITALS — BP 109/51 | HR 75 | Wt 227.0 lb

## 2021-02-23 DIAGNOSIS — O0990 Supervision of high risk pregnancy, unspecified, unspecified trimester: Secondary | ICD-10-CM

## 2021-02-23 DIAGNOSIS — Z31430 Encounter of female for testing for genetic disease carrier status for procreative management: Secondary | ICD-10-CM | POA: Diagnosis not present

## 2021-02-23 DIAGNOSIS — O24419 Gestational diabetes mellitus in pregnancy, unspecified control: Secondary | ICD-10-CM

## 2021-02-23 DIAGNOSIS — Z3A14 14 weeks gestation of pregnancy: Secondary | ICD-10-CM

## 2021-02-23 NOTE — Progress Notes (Signed)
° °  PRENATAL VISIT NOTE  Subjective:  Melanie Cruz is a 26 y.o. G3P1011 at [redacted]w[redacted]d being seen today for ongoing prenatal care.  She is currently monitored for the following issues for this high-risk pregnancy and has Tobacco abuse counseling; Furunculosis of multiple sites; Avulsion fracture of tibial tuberosity; Migraine without aura and without status migrainosus, not intractable; Tension type headache; Prediabetes; PCOS (polycystic ovarian syndrome); Diarrhea; Chronic rhinitis; Supervision of high risk pregnancy, antepartum; and Gestational diabetes mellitus (GDM), antepartum on their problem list.  Patient reports no complaints.   . Vag. Bleeding: None.  Movement: Absent. Denies leaking of fluid.   The following portions of the patient's history were reviewed and updated as appropriate: allergies, current medications, past family history, past medical history, past social history, past surgical history and problem list.   Objective:   Vitals:   02/23/21 0917  BP: (!) 109/51  Pulse: 75  Weight: 227 lb (103 kg)    Fetal Status: Fetal Heart Rate (bpm): 160   Movement: Absent     General:  Alert, oriented and cooperative. Patient is in no acute distress.  Skin: Skin is warm and dry. No rash noted.   Cardiovascular: Normal heart rate noted  Respiratory: Normal respiratory effort, no problems with respiration noted  Abdomen: Soft, gravid, appropriate for gestational age.  Pain/Pressure: Absent     Pelvic: Cervical exam deferred        Extremities: Normal range of motion.  Edema: None  Mental Status: Normal mood and affect. Normal behavior. Normal judgment and thought content.   Assessment and Plan:  Pregnancy: G3P1011 at [redacted]w[redacted]d 1. [redacted] weeks gestation of pregnancy - Genetic Screening  2. Supervision of high risk pregnancy, antepartum - Genetic Screening  3. Gestational diabetes mellitus (GDM), antepartum, gestational diabetes method of control unspecified Did not bring log sheet.  Reports fastings mostly under 95 and PP mostly under 120. Did have a couple elevated ones around Valentines day and when she had [place] food. Recheck in 2 weeks with sheet.  Preterm labor symptoms and general obstetric precautions including but not limited to vaginal bleeding, contractions, leaking of fluid and fetal movement were reviewed in detail with the patient. Please refer to After Visit Summary for other counseling recommendations.   No follow-ups on file.  Future Appointments  Date Time Provider [facility]  03/09/2021 11:15 AM Truett Mainland, DO [facility] None  03/21/2021 10:15 AM Truett Mainland, DO [facility] None  03/27/2021 10:15 AM [facility] NURSE [facility]  03/27/2021 10:30 AM [facility] US3 [facility]  04/09/2021 10:00 AM Rice, Resa Miner, MD [facility] None  04/18/2021 10:15 AM Anyanwu, Sallyanne Havers, MD [facility] None    Truett Mainland, DO

## 2021-02-26 ENCOUNTER — Other Ambulatory Visit (HOSPITAL_COMMUNITY)
Admission: RE | Admit: 2021-02-26 | Discharge: 2021-02-26 | Disposition: A | Discharge status: Home / Self Care | Payer: BC Managed Care – PPO | Source: Ambulatory Visit | Attending: Family Medicine | Admitting: Family Medicine

## 2021-02-26 ENCOUNTER — Ambulatory Visit (INDEPENDENT_AMBULATORY_CARE_PROVIDER_SITE_OTHER): Payer: BC Managed Care – PPO

## 2021-02-26 ENCOUNTER — Other Ambulatory Visit: Payer: Self-pay

## 2021-02-26 VITALS — BP 104/53 | HR 68 | Wt 225.0 lb

## 2021-02-26 DIAGNOSIS — N898 Other specified noninflammatory disorders of vagina: Secondary | ICD-10-CM | POA: Insufficient documentation

## 2021-02-26 DIAGNOSIS — B3731 Acute candidiasis of vulva and vagina: Secondary | ICD-10-CM | POA: Insufficient documentation

## 2021-02-26 NOTE — Progress Notes (Signed)
SUBJECTIVE:  26 y.o. female complains of white vaginal discharge for 4 day(s). Denies abnormal vaginal bleeding or significant pelvic pain or fever. No UTI symptoms. Denies history of known exposure to STD.  Patient's last menstrual period was 11/14/2020 (exact date).  OBJECTIVE:  She appears well, afebrile. Urine dipstick: not done.  ASSESSMENT:  Vaginal Discharge  Vaginal irritation    PLAN:  GC, chlamydia, trichomonas, BVAG, CVAG probe sent to lab. Treatment: To be determined once lab results are received ROV prn if symptoms persist or worsen.   Devonna Oboyle l Aarushi Hemric, CMA

## 2021-02-28 LAB — CERVICOVAGINAL ANCILLARY ONLY
Bacterial Vaginitis (gardnerella): NEGATIVE
Candida Glabrata: NEGATIVE
Candida Vaginitis: POSITIVE — AB
Chlamydia: NEGATIVE
Comment: NEGATIVE
Comment: NEGATIVE
Comment: NEGATIVE
Comment: NEGATIVE
Comment: NEGATIVE
Comment: NORMAL
Neisseria Gonorrhea: NEGATIVE
Trichomonas: NEGATIVE

## 2021-03-01 ENCOUNTER — Telehealth: Payer: Self-pay

## 2021-03-01 NOTE — Telephone Encounter (Signed)
Pt called requesting Panorama results. Pt made aware that her test is still processing and she will receive a call once the test is resulted. Understanding was voiced. Pt also aware that she has a yeast infection. She states she is using over the counter off [facility].  Ivee Poellnitz l Khandi Kernes, CMA

## 2021-03-05 ENCOUNTER — Encounter: Payer: Self-pay | Admitting: Family Medicine

## 2021-03-07 MED ORDER — CYCLOBENZAPRINE HCL 10 MG PO TABS: 5.0000 mg | ORAL_TABLET | Freq: Three times a day (TID) | ORAL | 2 refills | Status: DC | PRN

## 2021-03-07 NOTE — Addendum Note (Signed)
Addended by: Truett Mainland on: 03/07/2021 04:42 PM ? ? Modules accepted: Orders ? ?

## 2021-03-08 ENCOUNTER — Telehealth: Payer: Self-pay

## 2021-03-08 NOTE — Telephone Encounter (Signed)
Pt called requesting Panorama results.Pt made aware that her Panorama was low risk. Pt's mom was given gender results.  ?Lucion Dilger l Riyanshi Wahab, CMA  ?

## 2021-03-08 NOTE — Progress Notes (Signed)
Office Visit Note  Patient: Melanie Cruz             Date of Birth: 1995/02/06           MRN: [id number]             PCP: Elise Benne Referring: Elise Benne Visit Date: 03/09/2021  Subjective:  New Patient (Initial Visit) (Bil hand itching, spots)   History of Present Illness: [name] is a 26 y.o. female here for evaluation of positive ANA, rashes, itching, and lymphadenopathy.  Her medical history includes prediabetes, past left knee tibial tuberosity avulsion, and tobacco use. She is currently pregnant in the second trimester.  She started to have itching affected bilateral hands and feet with a few areas of hyperpigmentation that started last summer. This occasionally becomes burning type pain if she scratches excessively. Also found to have enlarged cervical lymph nodes in PCP office exam. She does not use any medication or treatments for this. Symptoms have decreased in severity compared to the start. Lab workup showed low positive ANA and mildly elevated sedimentation rate. Prenatal evaluation included SSA and SSB antibody testing that was negative. She has occasional painful ulcers or sores on gingival surfaces. She has right sided intermittent painless lymph node swelling. She denies raynaud's symptoms, leg edema, or history of abnormal bleeding or blood clots.  Labs reviewed 12/2020 ANA 1:40 homogenous SSA, SSB neg RF neg ESR 34 CRP neg  Activities of Daily Living:  Patient reports morning stiffness for 0  none .   Patient Denies nocturnal pain.  Difficulty dressing/grooming: Denies Difficulty climbing stairs: Denies Difficulty getting out of chair: Denies Difficulty using hands for taps, buttons, cutlery, and/or writing: Denies  Review of Systems  Constitutional:  Positive for fatigue.  HENT:  Negative for mouth dryness.   Eyes:  Negative for dryness.  Respiratory:  Negative for shortness of breath.   Cardiovascular:  Negative for  swelling in legs/feet.  Gastrointestinal:  Negative for constipation.  Endocrine: Positive for increased urination.  Genitourinary:  Negative for difficulty urinating.  Musculoskeletal:  Negative for joint pain and joint pain.  Skin:  Positive for color change.  Allergic/Immunologic: Negative for susceptible to infections.  Neurological:  Negative for numbness.  Hematological:  Negative for bruising/bleeding tendency.  Psychiatric/Behavioral:  Negative for sleep disturbance.     PMFS History:  Patient Active Problem List   Diagnosis Date Noted   Patellar tendinitis of left knee 04/04/2021   Positive ANA (antinuclear antibody) 03/09/2021   Rash and other nonspecific skin eruption 03/09/2021   Sedimentation rate elevation 03/09/2021   Diarrhea 11/07/2020   PCOS (polycystic ovarian syndrome) 09/29/2020   Prediabetes 04/06/2020   Migraine without aura and without status migrainosus, not intractable 03/06/2020   Tension type headache 03/06/2020   Avulsion fracture of tibial tuberosity 02/25/2020   Chronic rhinitis 03/25/2019   Furunculosis of multiple sites 04/15/2017   Tobacco abuse counseling 03/13/2017    Past Medical History:  Diagnosis Date   COVID-19    Gestational diabetes mellitus (GDM), antepartum 02/21/2021   Migraine     Family History  Problem Relation Age of Onset   Hypertension Mother    Diabetes Mother    Breast cancer Paternal Aunt        age at onset 44's   Migraines Neg Hx    Past Surgical History:  Procedure Laterality Date   NO PAST SURGERIES     Social History   Social History Narrative  Lives with roommate   Right handed   Caffeine: 0-1 cup/day   Immunization History  Administered Date(s) Administered   MMR 09/10/2017   Tdap 03/15/2017, 09/10/2017     Objective: Vital Signs: BP 102/66 (BP Location: Right Arm, Patient Position: Sitting, Cuff Size: Normal)   Pulse 84   Resp 15   Ht 5' 4"  (1.626 m)   Wt 224 lb (101.6 kg)   LMP 11/14/2020  (Exact Date)   BMI 38.45 kg/m    Physical Exam Constitutional:      Appearance: She is obese.  HENT:     Right Ear: External ear normal.     Left Ear: External ear normal.     Mouth/Throat:     Mouth: Mucous membranes are moist.     Pharynx: Oropharynx is clear.  Eyes:     Conjunctiva/sclera: Conjunctivae normal.  Neck:     Comments: Right sided ~1cm nontender mobile lymph node present Cardiovascular:     Rate and Rhythm: Normal rate and regular rhythm.  Pulmonary:     Effort: Pulmonary effort is normal.     Breath sounds: Normal breath sounds.  Musculoskeletal:     Right lower leg: No edema.     Left lower leg: No edema.  Skin:    General: Skin is warm and dry.     Findings: No rash.     Comments: Small hyperpigmented patch on dorsum of right hand, possibly increased pigmentation on dorsal surfaces of finger joints  Neurological:     General: No focal deficit present.     Mental Status: She is alert.     Deep Tendon Reflexes: Reflexes normal.  Psychiatric:        Mood and Affect: Mood normal.      Musculoskeletal Exam:  Shoulders full ROM no tenderness or swelling Elbows full ROM no tenderness or swelling Wrists full ROM no tenderness or swelling Fingers full ROM no tenderness or swelling Knees full ROM no tenderness or swelling Ankles full ROM no tenderness or swelling  Investigation: No additional findings.  Imaging: No results found.  Recent Labs: Lab Results  Component Value Date   WBC 7.9 06/14/2021   HGB 11.2 06/14/2021   PLT 185 06/14/2021   NA 136 04/30/2021   K 3.6 04/30/2021   CL 108 04/30/2021   CO2 22 04/30/2021   GLUCOSE 89 04/30/2021   BUN 8 04/30/2021   CREATININE 0.57 04/30/2021   BILITOT <0.1 (L) 04/30/2021   ALKPHOS 40 04/30/2021   AST 16 04/30/2021   ALT 18 04/30/2021   PROT 7.0 04/30/2021   ALBUMIN 3.2 (L) 04/30/2021   CALCIUM 8.6 (L) 04/30/2021   GFRAA >60 05/24/2019    Speciality Comments: No specialty comments  available.  Procedures:  No procedures performed Allergies: Azithromycin and Cefdinir   Assessment / Plan:     Visit Diagnoses: Positive ANA (antinuclear antibody) - Plan: Anti-DNA antibody, double-stranded, Anti-Smith antibody, C3 and C4  Positive ANA and a low titer does not have definite clinical criteria but some features including lymphadenopathy skin rashes arthralgias fatigue could be consistent with systemic inflammatory process.  We will check more specific antibody panel here today and serum complements.  If entirely negative could be more consistent with problems more related to PCOS contributing.  Rash and other nonspecific skin eruption  Skin rashes on dorsum of hand and fingers do not follow typical pattern of go across papules or cutaneous lupus rash on the hands.  Sedimentation rate elevation -  Plan: Sedimentation rate  We will repeat the previously elevated sedimentation rate to check on this today.  Looks like her symptoms are overall less severe than at original onset and time of referral so may be seen between exacerbations.  Orders: Orders Placed This Encounter  Procedures   Anti-DNA antibody, double-stranded   Anti-Smith antibody   C3 and C4   Sedimentation rate   No orders of the defined types were placed in this encounter.    Follow-Up Instructions: Return in about 4 weeks (around 04/06/2021) for New pt +ANA f/u ~4wks.   Collier Salina, MD  Note - This record has been created using [employer].  Chart creation errors have been sought, but may not always  have been located. Such creation errors do not reflect on  the standard of medical care.

## 2021-03-09 ENCOUNTER — Other Ambulatory Visit: Payer: Self-pay

## 2021-03-09 ENCOUNTER — Telehealth (INDEPENDENT_AMBULATORY_CARE_PROVIDER_SITE_OTHER): Payer: BC Managed Care – PPO | Admitting: Family Medicine

## 2021-03-09 ENCOUNTER — Encounter: Payer: Self-pay | Admitting: Internal Medicine

## 2021-03-09 ENCOUNTER — Ambulatory Visit: Payer: BC Managed Care – PPO | Admitting: Internal Medicine

## 2021-03-09 VITALS — BP 102/66 | HR 84 | Resp 15 | Ht 64.0 in | Wt 224.0 lb

## 2021-03-09 DIAGNOSIS — O0992 Supervision of high risk pregnancy, unspecified, second trimester: Secondary | ICD-10-CM

## 2021-03-09 DIAGNOSIS — R700 Elevated erythrocyte sedimentation rate: Secondary | ICD-10-CM | POA: Diagnosis not present

## 2021-03-09 DIAGNOSIS — R768 Other specified abnormal immunological findings in serum: Secondary | ICD-10-CM

## 2021-03-09 DIAGNOSIS — O24419 Gestational diabetes mellitus in pregnancy, unspecified control: Secondary | ICD-10-CM

## 2021-03-09 DIAGNOSIS — R21 Rash and other nonspecific skin eruption: Secondary | ICD-10-CM | POA: Diagnosis not present

## 2021-03-09 DIAGNOSIS — O9229 Other disorders of breast associated with pregnancy and the puerperium: Secondary | ICD-10-CM

## 2021-03-09 DIAGNOSIS — S82153A Displaced fracture of unspecified tibial tuberosity, initial encounter for closed fracture: Secondary | ICD-10-CM | POA: Diagnosis not present

## 2021-03-09 DIAGNOSIS — Z3A16 16 weeks gestation of pregnancy: Secondary | ICD-10-CM

## 2021-03-09 DIAGNOSIS — O361990 Maternal care for other isoimmunization, unspecified trimester, not applicable or unspecified: Secondary | ICD-10-CM

## 2021-03-09 DIAGNOSIS — N6310 Unspecified lump in the right breast, unspecified quadrant: Secondary | ICD-10-CM

## 2021-03-09 DIAGNOSIS — O0990 Supervision of high risk pregnancy, unspecified, unspecified trimester: Secondary | ICD-10-CM

## 2021-03-09 MED ORDER — DEXCOM G6 TRANSMITTER MISC: 1.0000 [IU] | 3 refills | Status: DC

## 2021-03-09 MED ORDER — DEXCOM G6 SENSOR MISC: 1.0000 [IU] | 3 refills | Status: DC

## 2021-03-09 NOTE — Progress Notes (Signed)
? ?  OBSTETRICS PRENATAL VIRTUAL VISIT ENCOUNTER NOTE ? ?Provider location: [facility]  ? ?Patient location: Home ? ?I connected with Melanie Cruz on 03/09/21 at 11:15 AM EST by MyChart Video Encounter and verified that I am speaking with the correct person using two identifiers. I discussed the limitations, risks, security and privacy concerns of performing an evaluation and management service virtually and the availability of in person appointments. I also discussed with the patient that there may be a patient responsible charge related to this service. The patient expressed understanding and agreed to proceed. ?Subjective:  ?Melanie Cruz is a 26 y.o. G3P1011 at [redacted]w[redacted]d being seen today for ongoing prenatal care.  She is currently monitored for the following issues for this high-risk pregnancy and has Tobacco abuse counseling; Furunculosis of multiple sites; Avulsion fracture of tibial tuberosity; Migraine without aura and without status migrainosus, not intractable; Tension type headache; Prediabetes; PCOS (polycystic ovarian syndrome); Diarrhea; Chronic rhinitis; Supervision of high risk pregnancy, antepartum; Gestational diabetes mellitus (GDM), antepartum; Positive ANA (antinuclear antibody); Rash and other nonspecific skin eruption; and Sedimentation rate elevation on their problem list. ? ?Patient reports  noticed a breast lump in right breast last night .   . Vag. Bleeding: None.  Movement: Present. Denies any leaking of fluid.  ? ?The following portions of the patient's history were reviewed and updated as appropriate: allergies, current medications, past family history, past medical history, past social history, past surgical history and problem list.  ? ?Objective:  ?There were no vitals filed for this visit. ? ?Fetal Status:     Movement: Present    ? ?General:  Alert, oriented and cooperative. Patient is in no acute distress.  ?Respiratory: Normal  respiratory effort, no problems with respiration noted  ?Mental Status: Normal mood and affect. Normal behavior. Normal judgment and thought content.  ?Rest of physical exam deferred due to type of encounter ? ?Imaging: ?No results found. ? ?Assessment and Plan:  ?Pregnancy: G3P1011 at 106w3d ?1. Supervision of high risk pregnancy, antepartum ?FHT and Fh normal ? ?2. Gestational diabetes mellitus (GDM), antepartum, gestational diabetes method of control unspecified ?Controlled. ?Will try to get CGM ? ?3. Positive ANA (antinuclear antibody) ?Has seen rheumatology earlier today. ? ?4. Mass of right breast, unspecified quadrant ?Will have patient follow up in office. ? ?Preterm labor symptoms and general obstetric precautions including but not limited to vaginal bleeding, contractions, leaking of fluid and fetal movement were reviewed in detail with the patient. ?I discussed the assessment and treatment plan with the patient. The patient was provided an opportunity to ask questions and all were answered. The patient agreed with the plan and demonstrated an understanding of the instructions. The patient was advised to call back or seek an in-person office evaluation/go to [facility]U at [facility] for any urgent or concerning symptoms. ?Please refer to After Visit Summary for other counseling recommendations.  ? ?I provided 15 minutes of face-to-face time during this encounter. ? ?Return for Work in for painful right breast lump. ? ?Future Appointments  ?Date Time Provider [place]  ?03/21/2021 10:15 AM Truett Mainland, DO [facility] None  ?03/27/2021 10:15 AM [facility] NURSE [facility]  ?03/27/2021 10:30 AM [facility] US3 [facility]  ?04/18/2021 10:15 AM Anyanwu, Sallyanne Havers, MD [facility] None  ?05/16/2021 10:00 AM Rice, Resa Miner, MD [facility] None  ? ? ?Truett Mainland, DO ?[facility] [place] ?

## 2021-03-11 LAB — C3 AND C4
C3 Complement: 159 mg/dL (ref 83–193)
C4 Complement: 31 mg/dL (ref 15–57)

## 2021-03-11 LAB — ANTI-DNA ANTIBODY, DOUBLE-STRANDED: ds DNA Ab: <1 IU/mL

## 2021-03-11 LAB — SEDIMENTATION RATE: Sed Rate: 28 mm/h — ABNORMAL HIGH (ref 0–20)

## 2021-03-11 LAB — ANTI-SMITH ANTIBODY: ENA SM Ab Ser-aCnc: <1 AI

## 2021-03-12 ENCOUNTER — Telehealth: Payer: Self-pay | Admitting: Internal Medicine

## 2021-03-12 NOTE — Telephone Encounter (Signed)
Patient left a voicemail stating she saw where her lab results were posted in [place]. Patient states she would like to speak with Dr. Benjamine Mola regarding those results. ?

## 2021-03-14 ENCOUNTER — Telehealth: Payer: Self-pay

## 2021-03-14 NOTE — Telephone Encounter (Signed)
Patient called requesting a return call to discuss the results of her labwork.  Patient's npt fu appointment is not until 04/30/21 due to provider's schedule. ?

## 2021-03-15 ENCOUNTER — Ambulatory Visit (INDEPENDENT_AMBULATORY_CARE_PROVIDER_SITE_OTHER): Payer: BC Managed Care – PPO | Admitting: Family Medicine

## 2021-03-15 ENCOUNTER — Encounter: Payer: Self-pay | Admitting: Family Medicine

## 2021-03-15 VITALS — BP 111/70 | HR 83 | Wt 227.0 lb

## 2021-03-15 DIAGNOSIS — O24419 Gestational diabetes mellitus in pregnancy, unspecified control: Secondary | ICD-10-CM

## 2021-03-15 DIAGNOSIS — N61 Mastitis without abscess: Secondary | ICD-10-CM

## 2021-03-15 DIAGNOSIS — R768 Other specified abnormal immunological findings in serum: Secondary | ICD-10-CM

## 2021-03-15 DIAGNOSIS — O0990 Supervision of high risk pregnancy, unspecified, unspecified trimester: Secondary | ICD-10-CM

## 2021-03-15 MED ORDER — SULFAMETHOXAZOLE-TRIMETHOPRIM 800-160 MG PO TABS: 1.0000 | ORAL_TABLET | Freq: Two times a day (BID) | ORAL | 1 refills | Status: DC

## 2021-03-15 NOTE — Progress Notes (Signed)
? ?  PRENATAL VISIT NOTE ? ?Subjective:  ?Melanie Cruz is a 26 y.o. G3P1011 at 1w2dbeing seen today for ongoing prenatal care.  She is currently monitored for the following issues for this high-risk pregnancy and has Tobacco abuse counseling; Furunculosis of multiple sites; Avulsion fracture of tibial tuberosity; Migraine without aura and without status migrainosus, not intractable; Tension type headache; Prediabetes; PCOS (polycystic ovarian syndrome); Diarrhea; Chronic rhinitis; Supervision of high risk pregnancy, antepartum; Gestational diabetes mellitus (GDM), antepartum; Positive ANA (antinuclear antibody); Rash and other nonspecific skin eruption; and Sedimentation rate elevation on their problem list. ? ?Patient reports  has area of firmness on right lower breast  .  Contractions: Not present. Vag. Bleeding: None.  Movement: Absent. Denies leaking of fluid.  ? ?The following portions of the patient's history were reviewed and updated as appropriate: allergies, current medications, past family history, past medical history, past social history, past surgical history and problem list.  ? ?Objective:  ? ?Vitals:  ? 03/15/21 1359  ?BP: 111/70  ?Pulse: 83  ?Weight: 227 lb (103 kg)  ? ? ?Fetal Status: Fetal Heart Rate (bpm): 140   Movement: Absent    ? ?General:  Alert, oriented and cooperative. Patient is in no acute distress.  ?Skin: Skin is warm and dry. No rash noted. 3cm area of firmness on right lower breast  ?Cardiovascular: Normal heart rate noted  ?Respiratory: Normal respiratory effort, no problems with respiration noted  ?Abdomen: Soft, gravid, appropriate for gestational age.  Pain/Pressure: Absent     ?Pelvic: Cervical exam deferred        ?Extremities: Normal range of motion.  Edema: None  ?Mental Status: Normal mood and affect. Normal behavior. Normal judgment and thought content.  ? ?Assessment and Plan:  ?Pregnancy: G3P1011 at 155w2d1. Supervision of high risk pregnancy, antepartum ?FHT and FH  normal ? ?2. Gestational diabetes mellitus (GDM), antepartum, gestational diabetes method of control unspecified ?CBGs reviewed on CGM - slightly elevated fasting this morning. Will watch carefully. Patient to write down values.  ?PP controlled. ? ?3. Positive ANA (antinuclear antibody) ?Seeing rheumatology. Additional testing normal ? ?4. Cellulitis of right breast ?Bactrim BID ? ?Preterm labor symptoms and general obstetric precautions including but not limited to vaginal bleeding, contractions, leaking of fluid and fetal movement were reviewed in detail with the patient. ?Please refer to After Visit Summary for other counseling recommendations.  ? ?No follow-ups on file. ? ?Future Appointments  ?Date Time Provider [place]?03/21/2021 10:15 AM StTruett MainlandDO [facility] None  ?03/27/2021 10:15 AM [facility] NURSE [facility]  ?03/27/2021 10:30 AM [facility] US3 [facility]  ?04/18/2021 10:15 AM Anyanwu, UgSallyanne HaversMD [facility] None  ?04/30/2021 11:20 AM Rice, ChResa MinerMD [facility] None  ? ? ?JaTruett MainlandDO ? ?

## 2021-03-15 NOTE — Progress Notes (Signed)
Patient doesn't think her dexcom is working correctly.also having pain on lower left abdomen.  Kathrene Alu RN  ?

## 2021-03-16 NOTE — Telephone Encounter (Signed)
Labs are negative for specific antibodies and her sedimentation rate test is lower than before. I don't think we need to start or change any treatment at this time. ?Sometimes inflammatory problems are decreased during pregnancy. We can cancel the planned new pt f/u we can schedule to take another look about 6 months after her due date to reevaluate if she is having symptoms come back more by that time.

## 2021-03-17 ENCOUNTER — Other Ambulatory Visit: Payer: Self-pay | Admitting: Medical

## 2021-03-19 ENCOUNTER — Ambulatory Visit: Payer: BC Managed Care – PPO | Admitting: Rheumatology

## 2021-03-21 ENCOUNTER — Other Ambulatory Visit: Payer: Self-pay

## 2021-03-21 ENCOUNTER — Ambulatory Visit (INDEPENDENT_AMBULATORY_CARE_PROVIDER_SITE_OTHER): Payer: BC Managed Care – PPO | Admitting: Family Medicine

## 2021-03-21 VITALS — BP 113/63 | HR 97 | Wt 227.0 lb

## 2021-03-21 DIAGNOSIS — M25562 Pain in left knee: Secondary | ICD-10-CM

## 2021-03-21 DIAGNOSIS — O24415 Gestational diabetes mellitus in pregnancy, controlled by oral hypoglycemic drugs: Secondary | ICD-10-CM

## 2021-03-21 DIAGNOSIS — O0990 Supervision of high risk pregnancy, unspecified, unspecified trimester: Secondary | ICD-10-CM

## 2021-03-21 DIAGNOSIS — R768 Other specified abnormal immunological findings in serum: Secondary | ICD-10-CM

## 2021-03-21 DIAGNOSIS — R7689 Other specified abnormal immunological findings in serum: Secondary | ICD-10-CM

## 2021-03-21 DIAGNOSIS — Z3A18 18 weeks gestation of pregnancy: Secondary | ICD-10-CM

## 2021-03-21 NOTE — Progress Notes (Signed)
? ?  PRENATAL VISIT NOTE ? ?Subjective:  ?Melanie Cruz is a 26 y.o. G3P1011 at 35w1dbeing seen today for ongoing prenatal care.  She is currently monitored for the following issues for this high-risk pregnancy and has Tobacco abuse counseling; Furunculosis of multiple sites; Avulsion fracture of tibial tuberosity; Migraine without aura and without status migrainosus, not intractable; Tension type headache; Prediabetes; PCOS (polycystic ovarian syndrome); Diarrhea; Chronic rhinitis; Supervision of high risk pregnancy, antepartum; Gestational diabetes mellitus (GDM), antepartum; Positive ANA (antinuclear antibody); Rash and other nonspecific skin eruption; and Sedimentation rate elevation on their problem list. ? ?Patient reports  left knee pain with movement over the past few weeks. .  Contractions: Not present. Vag. Bleeding: None.  Movement: Present. Denies leaking of fluid.  ? ?The following portions of the patient's history were reviewed and updated as appropriate: allergies, current medications, past family history, past medical history, past social history, past surgical history and problem list.  ? ?Objective:  ? ?Vitals:  ? 03/21/21 1015  ?BP: 113/63  ?Pulse: 97  ?Weight: 227 lb (103 kg)  ? ? ?Fetal Status: Fetal Heart Rate (bpm): 155   Movement: Present    ? ?General:  Alert, oriented and cooperative. Patient is in no acute distress.  ?Skin: Skin is warm and dry. No rash noted.   ?Cardiovascular: Normal heart rate noted  ?Respiratory: Normal respiratory effort, no problems with respiration noted  ?Abdomen: Soft, gravid, appropriate for gestational age.  Pain/Pressure: Absent     ?Pelvic: Cervical exam deferred        ?Extremities: Normal range of motion.  Edema: None  ?Mental Status: Normal mood and affect. Normal behavior. Normal judgment and thought content.  ? ?Assessment and Plan:  ?Pregnancy: G3P1011 at 164w1d1. [redacted] weeks gestation of pregnancy ? ?2. Supervision of high risk pregnancy,  antepartum ?FHT and FH normal ? ?3. Gestational diabetes mellitus (GDM) controlled on oral hypoglycemic drug, antepartum ?Hasn't started metformin yet. ?Didn't bring log, but reports CBGs a 95-100 in AM and 130-140 after dinner. ?Start metformin in AM.  ? ?4. Positive ANA (antinuclear antibody) ?Seeing rheumatology. ? ?Preterm labor symptoms and general obstetric precautions including but not limited to vaginal bleeding, contractions, leaking of fluid and fetal movement were reviewed in detail with the patient. ?Please refer to After Visit Summary for other counseling recommendations.  ? ?No follow-ups on file. ? ?Future Appointments  ?Date Time Provider [place]?03/27/2021 10:15 AM [facility] NURSE [facility]  ?03/27/2021 10:30 AM [facility] US3 [facility]  ?04/18/2021 10:15 AM Anyanwu, UgSallyanne HaversMD [facility] None  ?02/18/2022 10:40 AM Rice, ChResa MinerMD [facility] None  ? ? ?JaTruett MainlandDO ?

## 2021-03-22 ENCOUNTER — Encounter: Payer: Self-pay | Admitting: Family Medicine

## 2021-03-27 ENCOUNTER — Other Ambulatory Visit: Payer: Self-pay

## 2021-03-27 ENCOUNTER — Encounter: Payer: Self-pay | Admitting: *Deleted

## 2021-03-27 ENCOUNTER — Ambulatory Visit: Payer: BC Managed Care – PPO | Admitting: *Deleted

## 2021-03-27 ENCOUNTER — Ambulatory Visit: Payer: BC Managed Care – PPO | Attending: Family Medicine

## 2021-03-27 ENCOUNTER — Ambulatory Visit (HOSPITAL_BASED_OUTPATIENT_CLINIC_OR_DEPARTMENT_OTHER): Payer: BC Managed Care – PPO | Admitting: Obstetrics

## 2021-03-27 ENCOUNTER — Other Ambulatory Visit: Payer: Self-pay | Admitting: *Deleted

## 2021-03-27 VITALS — BP 112/65 | HR 82

## 2021-03-27 DIAGNOSIS — O0990 Supervision of high risk pregnancy, unspecified, unspecified trimester: Secondary | ICD-10-CM | POA: Insufficient documentation

## 2021-03-27 DIAGNOSIS — Z3A18 18 weeks gestation of pregnancy: Secondary | ICD-10-CM | POA: Diagnosis not present

## 2021-03-27 DIAGNOSIS — O24415 Gestational diabetes mellitus in pregnancy, controlled by oral hypoglycemic drugs: Secondary | ICD-10-CM | POA: Insufficient documentation

## 2021-03-27 DIAGNOSIS — Z6838 Body mass index (BMI) 38.0-38.9, adult: Secondary | ICD-10-CM | POA: Insufficient documentation

## 2021-03-27 DIAGNOSIS — D259 Leiomyoma of uterus, unspecified: Secondary | ICD-10-CM | POA: Insufficient documentation

## 2021-03-27 DIAGNOSIS — O99212 Obesity complicating pregnancy, second trimester: Secondary | ICD-10-CM | POA: Diagnosis not present

## 2021-03-27 DIAGNOSIS — O321XX0 Maternal care for breech presentation, not applicable or unspecified: Secondary | ICD-10-CM | POA: Diagnosis not present

## 2021-03-27 DIAGNOSIS — Z3480 Encounter for supervision of other normal pregnancy, unspecified trimester: Secondary | ICD-10-CM | POA: Diagnosis not present

## 2021-03-27 DIAGNOSIS — E282 Polycystic ovarian syndrome: Secondary | ICD-10-CM | POA: Insufficient documentation

## 2021-03-27 DIAGNOSIS — Z363 Encounter for antenatal screening for malformations: Secondary | ICD-10-CM | POA: Diagnosis not present

## 2021-03-27 DIAGNOSIS — M329 Systemic lupus erythematosus, unspecified: Secondary | ICD-10-CM | POA: Diagnosis not present

## 2021-03-27 DIAGNOSIS — R7303 Prediabetes: Secondary | ICD-10-CM | POA: Diagnosis not present

## 2021-03-27 DIAGNOSIS — O3412 Maternal care for benign tumor of corpus uteri, second trimester: Secondary | ICD-10-CM | POA: Insufficient documentation

## 2021-03-27 DIAGNOSIS — Z362 Encounter for other antenatal screening follow-up: Secondary | ICD-10-CM

## 2021-03-28 ENCOUNTER — Encounter: Payer: Self-pay | Admitting: Family Medicine

## 2021-03-28 NOTE — Progress Notes (Signed)
MFM Note ? ?Melanie Cruz was seen for a detailed fetal anatomy scan due to maternal obesity with a BMI of 38.  She was recently diagnosed with gestational diabetes that is treated with metformin.  She has a Dexcom monitor in place.  Her hemoglobin A1c earlier in this pregnancy was 6.0%. ? ?She has screened positive for antinuclear antibodies and has been referred to rheumatology. Her SSA and SSB antibodies are negative. ? ?She had a cell free DNA test earlier in her pregnancy which indicated a low risk for trisomy 54, 73, and 13. A female fetus is predicted.  ? ?Based on the fetal biometry measurements obtained today, her EDC was changed to August 27, 2021, making her 18 weeks and 1 day pregnant today. ? ?The views of the fetal anatomy were limited today due to the fetal position and her early gestational age. ? ?The patient was informed that anomalies may be missed due to technical limitations. If the fetus is in a suboptimal position or maternal habitus is increased, visualization of the fetus in the maternal uterus may be impaired. ? ?The following were discussed during today's consultation: ? ?Gestational diabetes and pregnancy ? ?The implications and management of diabetes in pregnancy was discussed in detail with the patient.  ? ?She was advised to monitor her blood glucose values using the Dexcom monitor.   ? ?She was advised that the goals for her blood glucose values are fasting values of 90-95 or less and two-hour postprandial values of 120 or less.   ? ?Should the majority of her blood glucose measurements be above these values, her metformin dose may have to be increased or she may have to be switched to insulin to help her achieve better glycemic control.  ? ?The patient was advised that getting her fingerstick values as close to these goals as possible would provide her with the most optimal obstetrical outcome. ? ?We will continue to follow her with monthly growth ultrasounds.   ? ?We will consider  referring her for a fetal echocardiogram should the fetal cardiac views be unable to be fully visualized at her next exam. ? ?Weekly fetal testing should be started at around 32 weeks. ? ?The increased risk of polyhydramnios, fetal macrosomia, and preeclampsia associated with diabetes was also discussed.   ? ?The patient was advised that delivery for well-controlled diabetes in pregnancy is usually recommended at around 39 weeks.  Delivery at 37 weeks may be considered should her glycemic control be poor. ? ?Positive antinuclear antibodies ? ?The significance of testing positive for the antinuclear antibodies remains undetermined in pregnancy.   ? ?As she has screened negative for the SSA and SSB antibodies, her fetus should not be at risk for congenital heart block.   ? ?We will continue to follow her with monthly growth ultrasounds. ? ?A follow-up exam was scheduled in 4 weeks to assess the fetal growth and to complete the views of the fetal anatomy. ? ?The patient stated that all of her questions have been answered to her satisfaction.   ? ?We will continue to follow her closely with you throughout her pregnancy. ? ?A total of 30 minutes was spent counseling and coordinating the care for this patient.  Greater than 50% of the time was spent in direct face-to-face contact.  ?

## 2021-04-04 ENCOUNTER — Encounter: Payer: Self-pay | Admitting: Family Medicine

## 2021-04-04 ENCOUNTER — Ambulatory Visit: Payer: BC Managed Care – PPO | Admitting: Family Medicine

## 2021-04-04 VITALS — BP 98/60 | Ht 64.0 in | Wt 227.0 lb

## 2021-04-04 DIAGNOSIS — M7652 Patellar tendinitis, left knee: Secondary | ICD-10-CM | POA: Diagnosis not present

## 2021-04-04 NOTE — Progress Notes (Signed)
?  Melanie Cruz - 26 y.o. female MRN [id number]  Date of birth: 05/12/1995 ? ?SUBJECTIVE:  Including CC & ROS.  ?No chief complaint on file. ? ? ?Melanie Cruz is a 26 y.o. female that is presenting with acute left knee pain.  The pain is occurring more medially and anterior.  It is having a clicking sensation over the proximal patellar tendon.  No injury.  No history of surgery. ? ? ?Review of Systems ?See HPI  ? ?HISTORY: Past Medical, Surgical, Social, and Family History Reviewed & Updated per EMR.   ?Pertinent Historical Findings include: ? ?Past Medical History:  ?Diagnosis Date  ? COVID-19   ? Migraine   ? ? ?Past Surgical History:  ?Procedure Laterality Date  ? NO PAST SURGERIES    ? ? ? ?PHYSICAL EXAM:  ?VS: BP 98/60 (BP Location: Left Arm, Patient Position: Sitting)   Ht '5\' 4"'$  (1.626 m)   Wt 227 lb (103 kg)   LMP 11/14/2020 (Exact Date)   BMI 38.96 kg/m?  ?Physical Exam ?Gen: NAD, alert, cooperative with exam, well-appearing ?MSK:  ?Neurovascularly intact   ? ? ? ? ?ASSESSMENT & PLAN:  ? ?Patellar tendinitis of left knee ?Acutely occurring.  Having more pain and problem at the proximal patellar tendon. ?-Counseled on home exercise therapy and supportive care. ?-Pursue shockwave therapy. ?-Counseled on patellar tendon strap. ?-Could consider physical therapy. ? ? ? ? ?

## 2021-04-04 NOTE — Assessment & Plan Note (Signed)
Acutely occurring.  Having more pain and problem at the proximal patellar tendon. ?-Counseled on home exercise therapy and supportive care. ?-Pursue shockwave therapy. ?-Counseled on patellar tendon strap. ?-Could consider physical therapy. ?

## 2021-04-04 NOTE — Patient Instructions (Signed)
Good to see you ?Please try the exercises  ?You can try a compression sleeve or a patellar tendon strap  ?Please send me a message in MyChart with any questions or updates.  ?Please see me back to start shockwave therapy.  ? ?--Dr. Raeford Razor ? ?

## 2021-04-09 ENCOUNTER — Ambulatory Visit: Payer: BC Managed Care – PPO | Admitting: Internal Medicine

## 2021-04-11 ENCOUNTER — Ambulatory Visit: Payer: BC Managed Care – PPO | Admitting: Rheumatology

## 2021-04-18 ENCOUNTER — Ambulatory Visit (INDEPENDENT_AMBULATORY_CARE_PROVIDER_SITE_OTHER): Payer: BC Managed Care – PPO | Admitting: Obstetrics & Gynecology

## 2021-04-18 ENCOUNTER — Encounter: Payer: Self-pay | Admitting: Obstetrics & Gynecology

## 2021-04-18 VITALS — BP 102/61 | HR 81 | Wt 226.0 lb

## 2021-04-18 DIAGNOSIS — Z3A21 21 weeks gestation of pregnancy: Secondary | ICD-10-CM

## 2021-04-18 DIAGNOSIS — O99210 Obesity complicating pregnancy, unspecified trimester: Secondary | ICD-10-CM

## 2021-04-18 DIAGNOSIS — O0990 Supervision of high risk pregnancy, unspecified, unspecified trimester: Secondary | ICD-10-CM

## 2021-04-18 DIAGNOSIS — R21 Rash and other nonspecific skin eruption: Secondary | ICD-10-CM

## 2021-04-18 DIAGNOSIS — O24415 Gestational diabetes mellitus in pregnancy, controlled by oral hypoglycemic drugs: Secondary | ICD-10-CM

## 2021-04-18 MED ORDER — TRIAMCINOLONE ACETONIDE 0.025 % EX OINT: 1.0000 "application " | TOPICAL_OINTMENT | Freq: Two times a day (BID) | CUTANEOUS | 0 refills | Status: DC

## 2021-04-18 MED ORDER — METFORMIN HCL 500 MG PO TABS: 500.0000 mg | ORAL_TABLET | Freq: Every day | ORAL | 5 refills | Status: DC

## 2021-04-18 MED ORDER — ASPIRIN EC 81 MG PO TBEC: 81.0000 mg | DELAYED_RELEASE_TABLET | Freq: Every day | ORAL | 2 refills | Status: DC

## 2021-04-18 NOTE — Progress Notes (Signed)
? ?PRENATAL VISIT NOTE ? ?Subjective:  ?Melanie Cruz is a 26 y.o. G4P1021 at 41w2dbeing seen today for ongoing prenatal care.  She is currently monitored for the following issues for this high-risk pregnancy and has Tobacco abuse counseling; Furunculosis of multiple sites; Avulsion fracture of tibial tuberosity; Migraine without aura and without status migrainosus, not intractable; Tension type headache; Prediabetes; PCOS (polycystic ovarian syndrome); Diarrhea; Chronic rhinitis; Supervision of high risk pregnancy, antepartum; Gestational diabetes mellitus (GDM), antepartum; Positive ANA (antinuclear antibody); Rash and other nonspecific skin eruption; Sedimentation rate elevation; and Patellar tendinitis of left knee on their problem list. ? ?Patient reports no complaints.  Contractions: Not present. Vag. Bleeding: None.  Movement: Present. Denies leaking of fluid.  ? ?The following portions of the patient's history were reviewed and updated as appropriate: allergies, current medications, past family history, past medical history, past social history, past surgical history and problem list.  ? ?Objective:  ? ?Vitals:  ? 04/18/21 1024  ?BP: 102/61  ?Pulse: 81  ?Weight: 226 lb (102.5 kg)  ? ? ?Fetal Status: Fetal Heart Rate (bpm): 158   Movement: Present    ? ?General:  Alert, oriented and cooperative. Patient is in no acute distress.  ?Skin: Skin is warm and dry. Mild macular rash noted on right upper arm, possible eczema.  ?Cardiovascular: Normal heart rate noted  ?Respiratory: Normal respiratory effort, no problems with respiration noted  ?Abdomen: Soft, gravid, appropriate for gestational age.  Pain/Pressure: Absent     ?Pelvic: Cervical exam deferred        ?Extremities: Normal range of motion.  Edema: None  ?Mental Status: Normal mood and affect. Normal behavior. Normal judgment and thought content.  ? ?Imaging: ?[place]MFM OB DETAIL +14 WK ? ?Result Date:  03/28/2021 ?----------------------------------------------------------------------  OBSTETRICS REPORT                       (Signed Final 03/28/2021 08:47 am) ---------------------------------------------------------------------- Patient Info  ID #:       [id number]                         D.O.B.:  [birth year](26 yrs)  Name:       Melanie Cruz                Visit Date: 03/27/2021 10:58 am ---------------------------------------------------------------------- Performed By  Attending:        VJohnell ComingsMD         Ref. Address:     [address]  [place]  Performed By:     Eveline Keto         [facility]  Referred By:      Truett Mainland                    MD ---------------------------------------------------------------------- Orders  #  Description                           Code        Ordered By  1  [place] MFM OB DETAIL +14 [place]               76811.01    [place] ----------------------------------------------------------------------  #  Order #                     Accession #                Episode #  1  [id number]                   [id number]                 [id number] ---------------------------------------------------------------------- Indications  Gestational diabetes in pregnancy,             O24.415  controlled by oral hypoglycemic drugs  Obesity complicating pregnancy, second         O99.212  trimester  [redacted] weeks gestation of pregnancy                Z3A.18  Antenatal screening for malformations          Z36.3  LR NIPS/Horizon-Neg  Uterine fibroids                                O34.10 ---------------------------------------------------------------------- Fetal Evaluation  Num Of Fetuses:         1  Fetal Heart Rate(bpm):  164  Cardiac Activity:       Observed  Presentation:           Breech  Placenta:               Posterior  P. Cord Insertion:      Visualized  Amniotic Fluid  AFI FV:      Within  normal limits ---------------------------------------------------------------------- Biometry  BPD:      39.1  mm     G. Age:  17w 6d         39  %    CI:         67.6   %    70 - 86                                                          FL/HC:      17.3   %    15.8 - 18  HC:      152.2  mm     G. Age:  18w 2d         47  %    HC/AC:      1.20        1.07 - 1.29  AC:      126.6  mm     G. Age:  18w 2d         51  %    FL/BPD:     67.5   %  FL:       26.4  mm     G. Age:  18w 0d         40  %    FL/AC:      20.9   %    20 - 24  CER:        19  mm     G. Age:  18w 4d         74  %  NFT:       2.5  mm  LV:          6  mm  CM:        4.7  mm  Est. FW:     226  gm      0 lb 8 oz     45  % ---------------------------------------------------------------------- OB History  Gravidity:    3         Term:   1        Prem:   0        SAB:   1  TOP:          0       Ectopic:  0        Living: 1 ---------------------------------------------------------------------- Gestational Age  LMP:           19w 0d        Date:  11/14/20                 EDD:   08/21/21  U/S Today:     18w 1d                                        EDD:   08/27/21  Best:          18w 1d     Det. By:  U/S (03/27/21)           EDD:   08/27/21 ---------------------------------------------------------------------- Anatomy  Cranium:               Appears normal  Aortic Arch:            Appears normal  Cavum:                 Appears normal         Ductal Arch:            Appears normal  Ventricles:            Appears normal         Diaphragm:              Appears normal  Choroid Plexus:        Appears normal         Stomach:                 Appears normal, left                                                                        sided  Cerebellum:            Appears normal         Abdomen:                Appears normal  Posterior Fossa:       Appears normal         Abdominal Wall:         Appears nml (cord                                                                        insert, abd wall)  Nuchal Fold:           Appears normal         Cord Vessels:           Appears normal (3                                                                        vessel cord)  Face:                  Appears normal         Kidneys:                Appear normal                         (orbits and profile)  Lips:                  Appears normal         Bladder:                Appears normal  Thoracic:  Appears normal         Spine:                  Appears normal  Heart:                 Appears normal         Upper Extremities:      Appears normal                         (4CH, axis, and                         situs)  RVOT:                  Not well visualized    Lower Extremities:      Appears normal  LVOT:                  Not well visualized  Other:  VC, 3VV and 3VTV visualized. Nasal bone, lenses, maxilla, mandible          and falx visualized. Feet and open hands/5th digits visualized. ---------------------------------------------------------------------- Cervix Uterus Adnexa  Cervix  Length:              4  cm.  Normal appearance by transabdominal scan.  Uterus  Single fibroid noted, see table below.  Right Ovary  Within normal limits.  Left Ovary  Within normal limits. ---------------------------------------------------------------------- Myomas  Site                     L(cm)      W(cm)      D(cm)       Location  Anterior Mid/fundal      2.23       2.05       1.13 ----------------------------------------------------------------------  Blood Flow                  RI       PI       Comments  ---------------------------------------------------------------------- Comments  Melanie Cruz was seen for a detailed fetal anatomy scan due  to maternal obesity with a BMI of 38.  She was recently  diagnosed with gestational diabetes that is treated with  metformin.  She has a Dexcom monitor in place.  H

## 2021-04-18 NOTE — Progress Notes (Signed)
ROB 21.[redacted] wks GA ?Unsure about AFP please discuss ?Reports "rash" on right upper arm ?Threw away part of dexcom meter, not wearing now. Has been doing finger sticks, has log. ?

## 2021-04-20 ENCOUNTER — Encounter: Payer: Self-pay | Admitting: Cardiology

## 2021-04-20 ENCOUNTER — Ambulatory Visit (INDEPENDENT_AMBULATORY_CARE_PROVIDER_SITE_OTHER): Payer: BC Managed Care – PPO

## 2021-04-20 ENCOUNTER — Ambulatory Visit (INDEPENDENT_AMBULATORY_CARE_PROVIDER_SITE_OTHER): Payer: BC Managed Care – PPO | Admitting: Cardiology

## 2021-04-20 VITALS — BP 106/62 | HR 81 | Ht 65.0 in | Wt 227.4 lb

## 2021-04-20 DIAGNOSIS — R002 Palpitations: Secondary | ICD-10-CM | POA: Diagnosis not present

## 2021-04-20 DIAGNOSIS — E785 Hyperlipidemia, unspecified: Secondary | ICD-10-CM

## 2021-04-20 DIAGNOSIS — O99210 Obesity complicating pregnancy, unspecified trimester: Secondary | ICD-10-CM | POA: Diagnosis not present

## 2021-04-20 DIAGNOSIS — O24410 Gestational diabetes mellitus in pregnancy, diet controlled: Secondary | ICD-10-CM

## 2021-04-20 DIAGNOSIS — F17200 Nicotine dependence, unspecified, uncomplicated: Secondary | ICD-10-CM

## 2021-04-20 NOTE — Patient Instructions (Addendum)
Medication Instructions:  ?Your physician recommends that you continue on your current medications as directed. Please refer to the Current Medication list given to you today.  ?*If you need a refill on your cardiac medications before your next appointment, please call your pharmacy* ? ? ?Lab Work: ?None ?If you have labs (blood work) drawn today and your tests are completely normal, you will receive your results only by: ?MyChart Message (if you have MyChart) OR ?A paper copy in the mail ?If you have any lab test that is abnormal or we need to change your treatment, we will call you to review the results. ? ? ?Testing/Procedures: ?ZIO XT- Long Term Monitor Instructions ? ?Your physician has requested you wear a ZIO patch monitor for 7 days.  ?This is a single patch monitor. [employer] supplies one patch monitor per enrollment. Additional ?stickers are not available. Please do not apply patch if you will be having a Nuclear Stress Test,  ?Echocardiogram, Cardiac CT, MRI, or Chest Xray during the period you would be wearing the  ?monitor. The patch cannot be worn during these tests. You cannot remove and re-apply the  ?ZIO XT patch monitor.  ?Your ZIO patch monitor will be mailed 3 day USPS to your address on file. It may take 3-5 days  ?to receive your monitor after you have been enrolled.  ?Once you have received your monitor, please review the enclosed instructions. Your monitor  ?has already been registered assigning a specific monitor serial # to you. ? ?Billing and Patient Assistance Program Information ? ?We have supplied [employer] with any of your insurance information on file for billing purposes. ?[employer] offers a sliding scale Patient Assistance Program for patients that do not have  ?insurance, or whose insurance does not completely cover the cost of the ZIO monitor.  ?You must apply for the Patient Assistance Program to qualify for this discounted rate.  ?To apply, please call [employer] at [phone], select  option 4, select option 2, ask to apply for  ?Patient Assistance Program. Theodore Demark will ask your household income, and how many people  ?are in your household. They will quote your out-of-pocket cost based on that information.  ?[employer] will also be able to set up a 16-month interest-free payment plan if needed. ? ?Applying the monitor ?  ?Shave hair from upper left chest.  ?Hold abrader disc by orange tab. Rub abrader in 40 strokes over the upper left chest as  ?indicated in your monitor instructions.  ?Clean area with 4 enclosed alcohol pads. Let dry.  ?Apply patch as indicated in monitor instructions. Patch will be placed under collarbone on left  ?side of chest with arrow pointing upward.  ?Rub patch adhesive wings for 2 minutes. Remove white label marked "1". Remove the white  ?label marked "2". Rub patch adhesive wings for 2 additional minutes.  ?While looking in a mirror, press and release button in center of patch. A small green light will  ?flash 3-4 times. This will be your only indicator that the monitor has been turned on.  ?Do not shower for the first 24 hours. You may shower after the first 24 hours.  ?Press the button if you feel a symptom. You will hear a small click. Record Date, Time and  ?Symptom in the Patient Logbook.  ?When you are ready to remove the patch, follow instructions on the last 2 pages of Patient  ?Logbook. Stick patch monitor onto the last page of Patient Logbook.  ?Place Patient Logbook in  the blue and white box. Use locking tab on box and tape box closed  ?securely. The blue and white box has prepaid postage on it. Please place it in the mailbox as  ?soon as possible. Your physician should have your test results approximately 7 days after the  ?monitor has been mailed back to [facility].  ?Call [facility] at [phone] if you have questions regarding  ?your ZIO XT patch monitor. Call them immediately if you see an orange light blinking on your  ?monitor.   ?If your monitor falls off in less than 4 days, contact our Monitor department at [phone].  ?If your monitor becomes loose or falls off after 4 days call [employer] at [phone] for  ?suggestions on securing your monitor ? ? ? ?Follow-Up: ?At [facility], you and your health needs are our priority.  As part of our continuing mission to provide you with exceptional heart care, we have created designated Provider Care Teams.  These Care Teams include your primary Cardiologist (physician) and Advanced Practice Providers (APPs -  Physician Assistants and Nurse Practitioners) who all work together to provide you with the care you need, when you need it. ? ?We recommend signing up for the patient portal called "MyChart".  Sign up information is provided on this After Visit Summary.  MyChart is used to connect with patients for Virtual Visits (Telemedicine).  Patients are able to view lab/test results, encounter notes, upcoming appointments, etc.  Non-urgent messages can be sent to your provider as well.   ?To learn more about what you can do with MyChart, go to [email or web address].   ? ?Your next appointment:   ?12 week(s) ? ?The format for your next appointment:   ?In Person ? ?Provider:   ?Berniece Salines  ?[facility] ?[address] [place], [place] [address] ? ?Or ? ?Berniece Salines, DO ?[address], [place], [place] [address] ? ?Other Instructions ?Diabetes Mellitus and Nutrition, Adult ?When you have diabetes, or diabetes mellitus, it is very important to have healthy eating habits because your blood sugar (glucose) levels are greatly affected by what you eat and drink. Eating healthy foods in the right amounts, at about the same times every day, can help you: ?Manage your blood glucose. ?Lower your risk of heart disease. ?Improve your blood pressure. ?Reach or maintain a healthy weight. ?What can affect my meal plan? ?Every person with diabetes is different, and each person has different needs  for a meal plan. Your health care provider may recommend that you work with a dietitian to make a meal plan that is best for you. Your meal plan may vary depending on factors such as: ?The calories you need. ?The medicines you take. ?Your weight. ?Your blood glucose, blood pressure, and cholesterol levels. ?Your activity level. ?Other health conditions you have, such as heart or kidney disease. ?How do carbohydrates affect me? ?Carbohydrates, also called carbs, affect your blood glucose level more than any other type of food. Eating carbs raises the amount of glucose in your blood. ?It is important to know how many carbs you can safely have in each meal. This is different for every person. Your dietitian can help you calculate how many carbs you should have at each meal and for each snack. ?How does alcohol affect me? ?Alcohol can cause a decrease in blood glucose (hypoglycemia), especially if you use insulin or take certain diabetes medicines by mouth. Hypoglycemia can be a life-threatening condition. Symptoms of hypoglycemia, such as sleepiness, dizziness,  and confusion, are similar to symptoms of having too much alcohol. ?Do not drink alcohol if: ?Your health care provider tells you not to drink. ?You are pregnant, may be pregnant, or are planning to become pregnant. ?If you drink alcohol: ?Limit how much you have to: ?0-1 drink a day for women. ?0-2 drinks a day for men. ?Know how much alcohol is in your drink. In the [place] one drink equals one 12 oz bottle of beer (355 mL), one 5 oz glass of wine (148 mL), or one 1? oz glass of hard liquor (44 mL). ?Keep yourself hydrated with water, diet soda, or unsweetened iced tea. Keep in mind that regular soda, juice, and other mixers may contain a lot of sugar and must be counted as carbs. ?What are tips for following this plan? ? ?Reading food labels ?Start by checking the serving size on the Nutrition Facts label of packaged foods and drinks. The number of calories and  the amount of carbs, fats, and other nutrients listed on the label are based on one serving of the item. Many items contain more than one serving per package. ?Check the total grams (g) of carbs in

## 2021-04-20 NOTE — Progress Notes (Signed)
?[facility] ? ?New Evaluation ? ?Date:  04/24/2021  ? ?ID:  Melanie Cruz, DOB 1995-12-16, MRN [id number] ? ?PCP:  Mackie Pai, PA-C ?  ?[facility] Providers ?Cardiologist:  None  ?Electrophysiologist:  None      ? ?Referring MD: Mackie Pai, PA-C  ? ?Chief Complaint: " I am doing oK" ? ?History of Present Illness:   ? ?Melanie Cruz is a 26 y.o. female [G4P1021] who is being seen today for the evaluation of  at the request of Saguier, Percell Miller, [place].  Medical history includes prediabetes now gestational diabetes, obesity, smoker here today to be evaluated for palpitations.  The patient tells me that over her pregnancy she has been experiencing intermittent palpitations.  She described as abrupt onset of fast heartbeat which goes for minutes at a time and then resolved.  Sometimes she experiences lightheadedness or dizziness.  No chest pain or shortness of breath. ? ?Prior CV Studies Reviewed: ?The following studies were reviewed today: ?None ? ?Past Medical History:  ?Diagnosis Date  ? COVID-19   ? Migraine   ? ? ?Past Surgical History:  ?Procedure Laterality Date  ? NO PAST SURGERIES    ?   ? ?OB History   ? ? Gravida  ?4  ? Para  ?1  ? Term  ?1  ? Preterm  ?   ? AB  ?2  ? Living  ?1  ?  ? ? SAB  ?   ? IAB  ?1  ? Ectopic  ?   ? Multiple  ?0  ? Live Births  ?1  ?   ?  ?  ?    ? ? ?Current Medications: ?Current Meds  ?Medication Sig  ? acetaminophen (TYLENOL) 325 MG tablet Take 650 mg by mouth every 6 (six) hours as needed.  ? aspirin EC 81 MG tablet Take 1 tablet (81 mg total) by mouth daily. Take after 12 weeks for prevention of preeclampsia later in pregnancy  ? Continuous Blood Gluc Sensor (DEXCOM G6 SENSOR) MISC 1 Units by Does not apply route as directed. Change every 10 days.  ? Continuous Blood Gluc Transmit (DEXCOM G6 TRANSMITTER) MISC 1 Units by Does not apply route as directed. Change every 90 days  ? cyclobenzaprine (FLEXERIL) 10 MG tablet Take 0.5-1 tablets (5-10 mg total) by  mouth 3 (three) times daily as needed for muscle spasms.  ? fluticasone (FLONASE) 50 MCG/ACT nasal spray Place into the nose as needed.  ? metFORMIN (GLUCOPHAGE) 500 MG tablet Take 1 tablet (500 mg total) by mouth daily with breakfast.  ? Prenatal Vit-Fe Fumarate-FA (PRENATAL VITAMINS PO) Take by mouth.  ? triamcinolone (KENALOG) 0.025 % ointment Apply 1 application. topically 2 (two) times daily. (Patient taking differently: Apply 1 application. topically as needed.)  ?  ? ?Allergies:   Azithromycin and Cefdinir  ? ?Social History  ? ?Socioeconomic History  ? Marital status: Single  ?  Spouse name: Not on file  ? Number of children: Not on file  ? Years of education: Not on file  ? Highest education level: Not on file  ?Occupational History  ? Not on file  ?Tobacco Use  ? Smoking status: Every Day  ?  Packs/day: 0.10  ?  Years: 5.00  ?  Pack years: 0.50  ?  Types: Cigarettes  ? Smokeless tobacco: Never  ? Tobacco comments:  ?  2 cig daily  ?Vaping Use  ? Vaping Use: Former  ?Substance and Sexual Activity  ?  Alcohol use: Not Currently  ? Drug use: Not Currently  ?  Types: Marijuana  ?  Comment: occasional-last use 05/23/2017  ? Sexual activity: Yes  ?  Birth control/protection: Inserts  ?  Comment: nuvaring   ?Other Topics Concern  ? Not on file  ?Social History Narrative  ? Lives with roommate  ? Right handed  ? Caffeine: 0-1 cup/day  ? ?Social Determinants of Health  ? ?Financial Resource Strain: Not on file  ?Food Insecurity: Not on file  ?Transportation Needs: Not on file  ?Physical Activity: Not on file  ?Stress: Not on file  ?Social Connections: Not on file  ?  ? ? ?Family History  ?Problem Relation Age of Onset  ? Hypertension Mother   ? Diabetes Mother   ? Breast cancer Paternal Aunt   ?     age at onset 22's  ? Migraines Neg Hx   ?   ? ?ROS:   ?Please see the history of present illness.    ? ?All other systems reviewed and are negative. ? ? ?Labs/EKG Reviewed:   ? ?EKG:   ?EKG is was not ordered today.    ? ?Recent Labs: ?06/15/2020: TSH 1.60 ?01/25/2021: ALT 19; BUN 7; Creatinine, Ser 0.62; Hemoglobin 12.0; Platelets 209; Potassium 3.9; Sodium 139  ? ?Recent Lipid Panel ?Lab Results  ?Component Value Date/Time  ? CHOL 196 05/09/2020 12:01 PM  ? TRIG 122.0 05/09/2020 12:01 PM  ? HDL 40.10 05/09/2020 12:01 PM  ? CHOLHDL 5 05/09/2020 12:01 PM  ? LDLCALC 131 (H) 05/09/2020 12:01 PM  ? ? ?Physical Exam:   ? ?VS:  BP 106/62   Pulse 81   Ht '5\' 5"'$  (1.651 m)   Wt 227 lb 6.4 oz (103.1 kg)   LMP 11/14/2020 (Exact Date)   SpO2 98%   BMI 37.84 kg/m?    ? ?Wt Readings from Last 3 Encounters:  ?04/20/21 227 lb 6.4 oz (103.1 kg)  ?04/18/21 226 lb (102.5 kg)  ?04/04/21 227 lb (103 kg)  ?  ? ?GEN:  Well nourished, well developed in no acute distress ?HEENT: Normal ?NECK: No JVD; No carotid bruits ?LYMPHATICS: No lymphadenopathy ?CARDIAC: RRR, no murmurs, rubs, gallops ?RESPIRATORY:  Clear to auscultation without rales, wheezing or rhonchi  ?ABDOMEN: Soft, non-tender, non-distended ?MUSCULOSKELETAL:  No edema; No deformity  ?SKIN: Warm and dry ?NEUROLOGIC:  Alert and oriented x 3 ?PSYCHIATRIC:  Normal affect  ? ? ?Risk Assessment/Risk Calculators:   ?  ?CARPREG II ?Risk Prediction Index Score:  1.  The patient's risk for a primary cardiac event is 5%. ?  ?   ?  ?  ? ? ?ASSESSMENT & PLAN:   ? ?Palpitations-I would like to rule out a cardiovascular etiology of this palpitation, therefore at this time I would like to placed a zio patch for  7 days. ? ?The patient was counseled on tobacco cessation today for 5 minutes.  Counseling included reviewing the risks of smoking tobacco products, how it impacts the patient's current medical diagnoses and different strategies for quitting.  Pharmacotherapy to aid in tobacco cessation was not prescribed today. The patient coordinate with  primary care provider.  The patient was also advised to call  1-800-QUIT-NOW 201-601-0327) for additional help with quitting smoking. ? ?Gestational  diabetes-this is being managed by the [facility] team and her primary care provider. ? ?The patient is in agreement with the above plan. The patient left the office in stable condition.  The patient will follow up in  12 weeks or sooner if needed. ? ?Patient Instructions  ?Medication Instructions:  ?Your physician recommends that you continue on your current medications as directed. Please refer to the Current Medication list given to you today.  ?*If you need a refill on your cardiac medications before your next appointment, please call your pharmacy* ? ? ?Lab Work: ?None ?If you have labs (blood work) drawn today and your tests are completely normal, you will receive your results only by: ?MyChart Message (if you have MyChart) OR ?A paper copy in the mail ?If you have any lab test that is abnormal or we need to change your treatment, we will call you to review the results. ? ? ?Testing/Procedures: ?ZIO XT- Long Term Monitor Instructions ? ?Your physician has requested you wear a ZIO patch monitor for 7 days.  ?This is a single patch monitor. [employer] supplies one patch monitor per enrollment. Additional ?stickers are not available. Please do not apply patch if you will be having a Nuclear Stress Test,  ?Echocardiogram, Cardiac CT, MRI, or Chest Xray during the period you would be wearing the  ?monitor. The patch cannot be worn during these tests. You cannot remove and re-apply the  ?ZIO XT patch monitor.  ?Your ZIO patch monitor will be mailed 3 day USPS to your address on file. It may take 3-5 days  ?to receive your monitor after you have been enrolled.  ?Once you have received your monitor, please review the enclosed instructions. Your monitor  ?has already been registered assigning a specific monitor serial # to you. ? ?Billing and Patient Assistance Program Information ? ?We have supplied [employer] with any of your insurance information on file for billing purposes. ?[employer] offers a sliding scale Patient Assistance  Program for patients that do not have  ?insurance, or whose insurance does not completely cover the cost of the ZIO monitor.  ?You must apply for the Patient Assistance Program to qualify for this discounted rate.  ?To ap

## 2021-04-23 ENCOUNTER — Ambulatory Visit: Payer: BC Managed Care – PPO | Admitting: Family Medicine

## 2021-04-25 ENCOUNTER — Encounter: Payer: Self-pay | Admitting: *Deleted

## 2021-04-25 ENCOUNTER — Ambulatory Visit: Payer: BC Managed Care – PPO | Attending: Obstetrics

## 2021-04-25 ENCOUNTER — Ambulatory Visit: Payer: BC Managed Care – PPO | Admitting: *Deleted

## 2021-04-25 VITALS — BP 120/64 | HR 80

## 2021-04-25 DIAGNOSIS — O99212 Obesity complicating pregnancy, second trimester: Secondary | ICD-10-CM

## 2021-04-25 DIAGNOSIS — O24415 Gestational diabetes mellitus in pregnancy, controlled by oral hypoglycemic drugs: Secondary | ICD-10-CM

## 2021-04-25 DIAGNOSIS — D251 Intramural leiomyoma of uterus: Secondary | ICD-10-CM

## 2021-04-25 DIAGNOSIS — O0990 Supervision of high risk pregnancy, unspecified, unspecified trimester: Secondary | ICD-10-CM | POA: Diagnosis not present

## 2021-04-25 DIAGNOSIS — Z3A22 22 weeks gestation of pregnancy: Secondary | ICD-10-CM

## 2021-04-25 DIAGNOSIS — E662 Morbid (severe) obesity with alveolar hypoventilation: Secondary | ICD-10-CM | POA: Diagnosis not present

## 2021-04-25 DIAGNOSIS — O3412 Maternal care for benign tumor of corpus uteri, second trimester: Secondary | ICD-10-CM

## 2021-04-25 DIAGNOSIS — Z362 Encounter for other antenatal screening follow-up: Secondary | ICD-10-CM | POA: Diagnosis not present

## 2021-04-30 ENCOUNTER — Ambulatory Visit: Payer: BC Managed Care – PPO

## 2021-04-30 ENCOUNTER — Emergency Department (HOSPITAL_BASED_OUTPATIENT_CLINIC_OR_DEPARTMENT_OTHER)
Admission: EM | Admit: 2021-04-30 | Discharge: 2021-04-30 | Disposition: A | Discharge status: Home / Self Care | Payer: BC Managed Care – PPO | Attending: Emergency Medicine | Admitting: Emergency Medicine

## 2021-04-30 ENCOUNTER — Encounter (HOSPITAL_BASED_OUTPATIENT_CLINIC_OR_DEPARTMENT_OTHER): Payer: Self-pay

## 2021-04-30 ENCOUNTER — Ambulatory Visit: Payer: BC Managed Care – PPO | Admitting: Internal Medicine

## 2021-04-30 DIAGNOSIS — R109 Unspecified abdominal pain: Secondary | ICD-10-CM

## 2021-04-30 DIAGNOSIS — R1013 Epigastric pain: Secondary | ICD-10-CM | POA: Diagnosis not present

## 2021-04-30 DIAGNOSIS — Z3A22 22 weeks gestation of pregnancy: Secondary | ICD-10-CM | POA: Diagnosis not present

## 2021-04-30 DIAGNOSIS — Z7982 Long term (current) use of aspirin: Secondary | ICD-10-CM | POA: Insufficient documentation

## 2021-04-30 DIAGNOSIS — O26892 Other specified pregnancy related conditions, second trimester: Secondary | ICD-10-CM | POA: Insufficient documentation

## 2021-04-30 DIAGNOSIS — O219 Vomiting of pregnancy, unspecified: Secondary | ICD-10-CM | POA: Insufficient documentation

## 2021-04-30 LAB — URINALYSIS, ROUTINE W REFLEX MICROSCOPIC
Bilirubin Urine: NEGATIVE
Glucose, UA: NEGATIVE mg/dL
Hgb urine dipstick: NEGATIVE
Ketones, ur: NEGATIVE mg/dL
Leukocytes,Ua: NEGATIVE
Nitrite: NEGATIVE
Protein, ur: NEGATIVE mg/dL
Specific Gravity, Urine: 1.025 (ref 1.005–1.030)
pH: 6.5 (ref 5.0–8.0)

## 2021-04-30 LAB — CBC
HCT: 35.3 % — ABNORMAL LOW (ref 36.0–46.0)
Hemoglobin: 11.2 g/dL — ABNORMAL LOW (ref 12.0–15.0)
MCH: 25.7 pg — ABNORMAL LOW (ref 26.0–34.0)
MCHC: 31.7 g/dL (ref 30.0–36.0)
MCV: 81.1 fL (ref 80.0–100.0)
Platelets: 185 10*3/uL (ref 150–400)
RBC: 4.35 MIL/uL (ref 3.87–5.11)
RDW: 14 % (ref 11.5–15.5)
WBC: 9.3 10*3/uL (ref 4.0–10.5)
nRBC: 0 % (ref 0.0–0.2)

## 2021-04-30 LAB — COMPREHENSIVE METABOLIC PANEL
ALT: 18 U/L (ref 0–44)
AST: 16 U/L (ref 15–41)
Albumin: 3.2 g/dL — ABNORMAL LOW (ref 3.5–5.0)
Alkaline Phosphatase: 40 U/L (ref 38–126)
Anion gap: 6 (ref 5–15)
BUN: 8 mg/dL (ref 6–20)
CO2: 22 mmol/L (ref 22–32)
Calcium: 8.6 mg/dL — ABNORMAL LOW (ref 8.9–10.3)
Chloride: 108 mmol/L (ref 98–111)
Creatinine, Ser: 0.57 mg/dL (ref 0.44–1.00)
GFR, Estimated: >60 mL/min (ref >60)
Glucose, Bld: 89 mg/dL (ref 70–99)
Potassium: 3.6 mmol/L (ref 3.5–5.1)
Sodium: 136 mmol/L (ref 135–145)
Total Bilirubin: <0.1 mg/dL — ABNORMAL LOW (ref 0.3–1.2)
Total Protein: 7 g/dL (ref 6.5–8.1)

## 2021-04-30 LAB — PREGNANCY, URINE: Preg Test, Ur: POSITIVE — AB

## 2021-04-30 LAB — LIPASE, BLOOD: Lipase: 37 U/L (ref 11–51)

## 2021-04-30 LAB — CBG MONITORING, ED: Glucose-Capillary: 93 mg/dL (ref 70–99)

## 2021-04-30 MED ORDER — LACTATED RINGERS IV BOLUS: 1000.0000 mL | Freq: Once | INTRAVENOUS | Status: DC

## 2021-04-30 NOTE — ED Provider Notes (Signed)
?[place] EMERGENCY DEPARTMENT ?Provider Note ? ? ?CSN: [id number] ?Arrival date & time: 04/30/21  0131 ? ?  ? ?History ? ?Chief Complaint  ?Patient presents with  ? Abdominal Pain  ?  22 week preg  ? ? ?Melanie Cruz is a 26 y.o. female. ? ?The history is provided by the patient and medical records.  ?Abdominal Pain ?Melanie Cruz is a 26 y.o. female who presents to the Emergency Department complaining of abdominal pain.  She presents to the emergency department for evaluation of epigastric abdominal pain that started around 1145 this evening.  It started about an hour after she ate pizza rolls.  She is a G3 P1-0-1-1 at 23 weeks.  Her abdominal pain was initially very severe.  It lasted about 45 minutes before it started to decrease in severity.  She did vomit 3 times in her pain is almost resolved at time of ED assessment.  No chest pain, dysuria, diarrhea, fevers.  She is followed by maternal-fetal  high risk for ANA positive, gestational diabetes, PCOS.  No leakage of fluids, no vaginal bleeding.  No prior abdominal surgeries. ? ?Home Medications ?Prior to Admission medications   ?Medication Sig Start Date End Date Taking? Authorizing Provider  ?acetaminophen (TYLENOL) 325 MG tablet Take 650 mg by mouth every 6 (six) hours as needed.    [provider]  ?aspirin EC 81 MG tablet Take 1 tablet (81 mg total) by mouth daily. Take after 12 weeks for prevention of preeclampsia later in pregnancy 04/18/21   Anyanwu, Sallyanne Havers, MD  ?Continuous Blood Gluc Sensor (DEXCOM G6 SENSOR) MISC 1 Units by Does not apply route as directed. Change every 10 days. 03/09/21   Truett Mainland, DO  ?Continuous Blood Gluc Transmit (DEXCOM G6 TRANSMITTER) MISC 1 Units by Does not apply route as directed. Change every 90 days 03/09/21   Truett Mainland, DO  ?cyclobenzaprine (FLEXERIL) 10 MG tablet Take 0.5-1 tablets (5-10 mg total) by mouth 3 (three) times daily as needed for muscle spasms. 03/07/21   Truett Mainland, DO   ?fluticasone (FLONASE) 50 MCG/ACT nasal spray Place into the nose as needed. 03/25/19   [provider]  ?metFORMIN (GLUCOPHAGE) 500 MG tablet Take 1 tablet (500 mg total) by mouth daily with breakfast. 04/18/21   Anyanwu, Sallyanne Havers, MD  ?Prenatal Vit-Fe Fumarate-FA (PRENATAL VITAMINS PO) Take by mouth.    [provider]  ?triamcinolone (KENALOG) 0.025 % ointment Apply 1 application. topically 2 (two) times daily. ?Patient taking differently: Apply 1 application. topically as needed. 04/18/21   Osborne Oman, MD  ?   ? ?Allergies    ?Azithromycin and Cefdinir   ? ?Review of Systems   ?Review of Systems  ?Gastrointestinal:  Positive for abdominal pain.  ?All other systems reviewed and are negative. ? ?Physical Exam ?Updated Vital Signs ?BP 114/72   Pulse 86   Temp (!) 97.4 ?F (36.3 ?C) (Oral)   Resp 16   Ht '5\' 4"'$  (1.626 m)   Wt 103 kg   LMP 11/14/2020 (Exact Date)   SpO2 100%   BMI 38.96 kg/m?  ?Physical Exam ?Vitals and nursing note reviewed.  ?Constitutional:   ?   Appearance: She is well-developed.  ?HENT:  ?   Head: Normocephalic and atraumatic.  ?Cardiovascular:  ?   Rate and Rhythm: Normal rate and regular rhythm.  ?   Heart sounds: No murmur heard. ?Pulmonary:  ?   Effort: Pulmonary effort is normal. No  respiratory distress.  ?   Breath sounds: Normal breath sounds.  ?Abdominal:  ?   Palpations: Abdomen is soft.  ?   Tenderness: There is no abdominal tenderness. There is no guarding or rebound.  ?   Comments: Gravid uterus without any reproducible tenderness  ?Musculoskeletal:     ?   General: No tenderness.  ?Skin: ?   General: Skin is warm and dry.  ?Neurological:  ?   Mental Status: She is alert and oriented to person, place, and time.  ?Psychiatric:     ?   Behavior: Behavior normal.  ? ? ?ED Results / Procedures / Treatments   ?Labs ?(all labs ordered are listed, but only abnormal results are displayed) ?Labs Reviewed  ?COMPREHENSIVE METABOLIC PANEL - Abnormal; Notable for the  following components:  ?    Result Value  ? Calcium 8.6 (*)   ? Albumin 3.2 (*)   ? Total Bilirubin <0.1 (*)   ? All other components within normal limits  ?CBC - Abnormal; Notable for the following components:  ? Hemoglobin 11.2 (*)   ? HCT 35.3 (*)   ? MCH 25.7 (*)   ? All other components within normal limits  ?URINALYSIS, ROUTINE W REFLEX MICROSCOPIC - Abnormal; Notable for the following components:  ? APPearance CLOUDY (*)   ? All other components within normal limits  ?PREGNANCY, URINE - Abnormal; Notable for the following components:  ? Preg Test, Ur POSITIVE (*)   ? All other components within normal limits  ?LIPASE, BLOOD  ?CBG MONITORING, ED  ? ? ?EKG ?None ? ?Radiology ?No results found. ? ?Procedures ?Procedures  ? ? ?Medications Ordered in ED ?Medications  ?lactated ringers bolus 1,000 mL (has no administration in time range)  ? ? ?ED Course/ Medical Decision Making/ A&P ?  ?                        ?Medical Decision Making ?Amount and/or Complexity of Data Reviewed ?Labs: ordered. ? ? ?Patient is a G3P1011 at 23 weeks here for evaluation of epigastric pain, vomiting.  Overall symptoms were resolving at time of ED arrival and during her ED stay completely resolved.  She is able to tolerate fluids.  She was placed on toco and fetal monitoring.  No evidence of contractions, fetal heart rate in the 150s.  CMP essentially within normal limits, pancreatic enzyme within normal limits.  Current clinical picture is not consistent with pancreatitis, active labor, cholecystitis, perforated viscus.  Discussed with patient home care for epigastric pain with OB follow-up and return precautions. ?D/w Dr. Harolyn Rutherford with MFM.   ? ? ? ? ? ? ?Final Clinical Impression(s) / ED Diagnoses ?Final diagnoses:  ?Epigastric pain  ?Abdominal pain during pregnancy in second trimester  ? ? ?Rx / DC Orders ?ED Discharge Orders   ? ? None  ? ?  ? ? ?  ?Quintella Reichert, MD ?04/30/21 [phone] ? ?

## 2021-04-30 NOTE — ED Triage Notes (Signed)
Pt c/o current 2/10 abd pain. Pt states she had 8/10 centralized abd pain starting around 2300, lasting approximately 1-2 hours with vomiting. Last oral intake pizza rolls. Pt states the pain is nearly gone now. +22weeks preg, denies bleeding/spotting, sick contacts.  ?

## 2021-04-30 NOTE — Progress Notes (Addendum)
Diomedes.Brome RN called regarding patient's arrival and CC.  Patient G4P1, 23w, reporting with sudden onset epigastric pain (started at 8/10, currently 2/10) and vomiting that has since subsided. GDM controlled with Metformin.  Denies LOF, vaginal bleeding, UCs.  BP WNL, denies visual changes.  EFM applied and assessed.  Attending physician notified and patient cleared by OB.  ?

## 2021-04-30 NOTE — ED Notes (Addendum)
Pt with a 22w 6d singleton pregnancy G4P1. Pt denies any pregnancy complications other than GDM controlled with daily Metformin. Pt reports sudden onset epigastric pain that lasted ~ 1 hour. She called her OB [facility]) after hours line and was advised to go to ED. Pt denies any leakage of fluid, vaginal bleeding or tightening of abd. She reported vomiting x3 PTA and after vomiting pain went from 8/10 to 2/10. Pt appears comfortable and in NAD. OB RR RN notified of patient on monitor.  ?

## 2021-04-30 NOTE — ED Notes (Signed)
Pt informed that IVF were ordered for her. Pt requesting to ask EDP if fluids are truly necessary. EDP agreeable to hold until hearing from OB. Pt given PO fluids per EDP request, drinking without issue.  ?

## 2021-04-30 NOTE — ED Notes (Signed)
Brooke, RN (OB RR) notified this RN that pt is OB cleared at this time. ?

## 2021-05-02 ENCOUNTER — Ambulatory Visit: Payer: BC Managed Care – PPO | Admitting: Family Medicine

## 2021-05-04 ENCOUNTER — Telehealth: Payer: Self-pay

## 2021-05-04 NOTE — Telephone Encounter (Signed)
Patient called stating that she woke up and "feels different". Patient is [redacted] weeks pregnant was seen at [facility] ED 4 days ago for abdominal pain.  ? ?Patient reports fetal movement this morning and no bleeding.  ?Patient asked where she can be seen this weekend if she needs it. Assured her that she can be seen anytime at Maternity Admissions unit - entrance C at [facility]. Patient states understanding. Kathrene Alu RN ?

## 2021-05-16 ENCOUNTER — Ambulatory Visit: Payer: BC Managed Care – PPO | Admitting: Internal Medicine

## 2021-05-17 ENCOUNTER — Ambulatory Visit (INDEPENDENT_AMBULATORY_CARE_PROVIDER_SITE_OTHER): Payer: BC Managed Care – PPO | Admitting: Family Medicine

## 2021-05-17 VITALS — BP 104/57 | HR 62 | Wt 230.0 lb

## 2021-05-17 DIAGNOSIS — N3000 Acute cystitis without hematuria: Secondary | ICD-10-CM

## 2021-05-17 DIAGNOSIS — O24415 Gestational diabetes mellitus in pregnancy, controlled by oral hypoglycemic drugs: Secondary | ICD-10-CM

## 2021-05-17 DIAGNOSIS — Z3A25 25 weeks gestation of pregnancy: Secondary | ICD-10-CM

## 2021-05-17 DIAGNOSIS — O99210 Obesity complicating pregnancy, unspecified trimester: Secondary | ICD-10-CM

## 2021-05-17 LAB — POCT URINALYSIS DIPSTICK
Bilirubin, UA: NEGATIVE
Blood, UA: NEGATIVE
Glucose, UA: NEGATIVE
Ketones, UA: NEGATIVE
Leukocytes, UA: NEGATIVE
Nitrite, UA: POSITIVE
Protein, UA: NEGATIVE
Spec Grav, UA: 1.015 (ref 1.010–1.025)
Urobilinogen, UA: 0.2 E.U./dL
pH, UA: 6.5 (ref 5.0–8.0)

## 2021-05-17 MED ORDER — SULFAMETHOXAZOLE-TRIMETHOPRIM 800-160 MG PO TABS: 1.0000 | ORAL_TABLET | Freq: Two times a day (BID) | ORAL | 1 refills | Status: DC

## 2021-05-17 MED ORDER — FLUCONAZOLE 150 MG PO TABS
150.0000 mg | ORAL_TABLET | Freq: Once | ORAL | 0 refills | Status: AC
Start: 2021-05-17 — End: 2021-05-17

## 2021-05-17 NOTE — Progress Notes (Signed)
? ?  PRENATAL VISIT NOTE ? ?Subjective:  ?Melanie Cruz is a 26 y.o. G4P1021 at 39w3dbeing seen today for ongoing prenatal care.  She is currently monitored for the following issues for this high-risk pregnancy and has Tobacco abuse counseling; Furunculosis of multiple sites; Avulsion fracture of tibial tuberosity; Migraine without aura and without status migrainosus, not intractable; Tension type headache; Prediabetes; PCOS (polycystic ovarian syndrome); Diarrhea; Chronic rhinitis; Supervision of high risk pregnancy, antepartum; Gestational diabetes mellitus (GDM), antepartum; Positive ANA (antinuclear antibody); Rash and other nonspecific skin eruption; Sedimentation rate elevation; and Patellar tendinitis of left knee on their problem list. ? ?Patient reports no complaints.  Contractions: Not present. Vag. Bleeding: None.  Movement: Present. Denies leaking of fluid.  ? ?The following portions of the patient's history were reviewed and updated as appropriate: allergies, current medications, past family history, past medical history, past social history, past surgical history and problem list.  ? ?Objective:  ? ?Vitals:  ? 05/17/21 1010  ?BP: (!) 104/57  ?Pulse: 62  ?Weight: 230 lb (104.3 kg)  ? ? ?Fetal Status: Fetal Heart Rate (bpm): 166   Movement: Present    ? ?General:  Alert, oriented and cooperative. Patient is in no acute distress.  ?Skin: Skin is warm and dry. No rash noted.   ?Cardiovascular: Normal heart rate noted  ?Respiratory: Normal respiratory effort, no problems with respiration noted  ?Abdomen: Soft, gravid, appropriate for gestational age.  Pain/Pressure: Absent     ?Pelvic: Cervical exam deferred        ?Extremities: Normal range of motion.  Edema: Trace  ?Mental Status: Normal mood and affect. Normal behavior. Normal judgment and thought content.  ? ?Assessment and Plan:  ?Pregnancy: [id number] 244w3d1. [redacted] weeks gestation of pregnancy ? ? ?2. Gestational diabetes mellitus (GDM) in second  trimester controlled on oral hypoglycemic drug ?A couple elevated fasting. PP controlled ?On metformin '500mg'$  in AM with breakfast. ?Not checking after dinner because she eats late, then goes to bed. ?Has [place] week. ? ?3. Obesity in pregnancy, antepartum ? ?4. Acute cystitis without hematuria ?Bactrim sent to pharmacy. ?- POCT urinalysis dipstick ?- Culture, OB Urine ? ?Preterm labor symptoms and general obstetric precautions including but not limited to vaginal bleeding, contractions, leaking of fluid and fetal movement were reviewed in detail with the patient. ?Please refer to After Visit Summary for other counseling recommendations.  ? ?No follow-ups on file. ? ?Future Appointments  ?Date Time Provider [place]?05/23/2021 11:15 AM WMC-MFC NURSE WMC-MFC WMC  ?05/23/2021 11:30 AM WMC-MFC US3 WMC-MFCUS WMC  ?06/14/2021 10:15 AM StTruett MainlandDO CWH-WMHP None  ?06/20/2021 10:45 AM WMC-MFC NURSE WMC-MFC WMC  ?06/20/2021 11:00 AM WMC-MFC US1 WMC-MFCUS WMC  ?06/28/2021 10:35 AM StNehemiah SettleaTanna SavoyDO CWH-WMHP None  ?07/13/2021  3:00 PM Tobb, KaGodfrey PickDO CVD-WMC None  ?02/18/2022 10:40 AM Rice, ChResa MinerMD CR-G[facility] None  ? ? ?JaTruett MainlandDO ?

## 2021-05-19 LAB — CULTURE, OB URINE

## 2021-05-19 LAB — URINE CULTURE, OB REFLEX

## 2021-05-23 ENCOUNTER — Ambulatory Visit: Payer: BC Managed Care – PPO | Attending: Obstetrics

## 2021-05-23 ENCOUNTER — Ambulatory Visit: Payer: BC Managed Care – PPO | Admitting: *Deleted

## 2021-05-23 ENCOUNTER — Encounter: Payer: Self-pay | Admitting: *Deleted

## 2021-05-23 ENCOUNTER — Other Ambulatory Visit: Payer: Self-pay | Admitting: *Deleted

## 2021-05-23 VITALS — BP 112/70 | HR 96

## 2021-05-23 DIAGNOSIS — E669 Obesity, unspecified: Secondary | ICD-10-CM

## 2021-05-23 DIAGNOSIS — O0990 Supervision of high risk pregnancy, unspecified, unspecified trimester: Secondary | ICD-10-CM | POA: Diagnosis not present

## 2021-05-23 DIAGNOSIS — Z362 Encounter for other antenatal screening follow-up: Secondary | ICD-10-CM | POA: Insufficient documentation

## 2021-05-23 DIAGNOSIS — O3410 Maternal care for benign tumor of corpus uteri, unspecified trimester: Secondary | ICD-10-CM

## 2021-05-23 DIAGNOSIS — O24419 Gestational diabetes mellitus in pregnancy, unspecified control: Secondary | ICD-10-CM

## 2021-05-23 DIAGNOSIS — O99212 Obesity complicating pregnancy, second trimester: Secondary | ICD-10-CM | POA: Diagnosis not present

## 2021-05-23 DIAGNOSIS — O24415 Gestational diabetes mellitus in pregnancy, controlled by oral hypoglycemic drugs: Secondary | ICD-10-CM | POA: Diagnosis not present

## 2021-05-23 DIAGNOSIS — Z3A26 26 weeks gestation of pregnancy: Secondary | ICD-10-CM

## 2021-05-23 DIAGNOSIS — Z3689 Encounter for other specified antenatal screening: Secondary | ICD-10-CM

## 2021-05-23 DIAGNOSIS — D219 Benign neoplasm of connective and other soft tissue, unspecified: Secondary | ICD-10-CM

## 2021-05-23 DIAGNOSIS — D259 Leiomyoma of uterus, unspecified: Secondary | ICD-10-CM

## 2021-05-24 ENCOUNTER — Encounter: Payer: Self-pay | Admitting: Family Medicine

## 2021-05-31 ENCOUNTER — Telehealth (INDEPENDENT_AMBULATORY_CARE_PROVIDER_SITE_OTHER): Payer: BC Managed Care – PPO | Admitting: Family Medicine

## 2021-05-31 VITALS — BP 107/55 | HR 80 | Wt 233.0 lb

## 2021-05-31 DIAGNOSIS — R768 Other specified abnormal immunological findings in serum: Secondary | ICD-10-CM

## 2021-05-31 DIAGNOSIS — O24410 Gestational diabetes mellitus in pregnancy, diet controlled: Secondary | ICD-10-CM

## 2021-05-31 DIAGNOSIS — G44229 Chronic tension-type headache, not intractable: Secondary | ICD-10-CM

## 2021-05-31 DIAGNOSIS — R21 Rash and other nonspecific skin eruption: Secondary | ICD-10-CM

## 2021-05-31 DIAGNOSIS — E282 Polycystic ovarian syndrome: Secondary | ICD-10-CM

## 2021-05-31 DIAGNOSIS — O0990 Supervision of high risk pregnancy, unspecified, unspecified trimester: Secondary | ICD-10-CM

## 2021-05-31 MED ORDER — BUSPIRONE HCL 5 MG PO TABS: 5.0000 mg | ORAL_TABLET | Freq: Two times a day (BID) | ORAL | 3 refills | Status: DC

## 2021-05-31 MED ORDER — CLINDAMYCIN PHOSPHATE 1 % EX GEL: Freq: Two times a day (BID) | CUTANEOUS | 11 refills | Status: DC

## 2021-05-31 NOTE — Patient Instructions (Signed)
Vitamin B12 225mg daily for headaches

## 2021-05-31 NOTE — Progress Notes (Signed)
   PRENATAL VISIT NOTE  Subjective:  Melanie Cruz is a 26 y.o. G4P1021 at 73w3dbeing seen today for ongoing prenatal care.  She is currently monitored for the following issues for this high-risk pregnancy and has Tobacco abuse counseling; Furunculosis of multiple sites; Avulsion fracture of tibial tuberosity; Migraine without aura and without status migrainosus, not intractable; Tension type headache; Prediabetes; PCOS (polycystic ovarian syndrome); Diarrhea; Chronic rhinitis; Supervision of high risk pregnancy, antepartum; Gestational diabetes mellitus (GDM), antepartum; Positive ANA (antinuclear antibody); Rash and other nonspecific skin eruption; Sedimentation rate elevation; and Patellar tendinitis of left knee on their problem list.  Patient reports  increased anxiety. Was on buspar and thought it was helpful. Has small bumps behind ear. Not itch or painful. Frequent mild headaches .  Contractions: Not present. Vag. Bleeding: None.  Movement: Present. Denies leaking of fluid.   The following portions of the patient's history were reviewed and updated as appropriate: allergies, current medications, past family history, past medical history, past social history, past surgical history and problem list.   Objective:   Vitals:   05/31/21 1055  BP: (!) 107/55  Pulse: 80  Weight: 233 lb (105.7 kg)    Fetal Status: Fetal Heart Rate (bpm): 161   Movement: Present     General:  Alert, oriented and cooperative. Patient is in no acute distress.  Skin: Skin is warm and dry. Small bumps behind ears. Possible clogged sweat ducts  Cardiovascular: Normal heart rate noted  Respiratory: Normal respiratory effort, no problems with respiration noted  Abdomen: Soft, gravid, appropriate for gestational age.  Pain/Pressure: Absent     Pelvic: Cervical exam deferred        Extremities: Normal range of motion.  Edema: None  Mental Status: Normal mood and affect. Normal behavior. Normal judgment and  thought content.   Assessment and Plan:  Pregnancy: G4P1021 at 228w3d. Supervision of high risk pregnancy, antepartum FHT and FH normal  2. Diet controlled gestational diabetes mellitus (GDM), antepartum On metformin  3. Rash and other nonspecific skin eruption Will try clindagel  4. Positive ANA (antinuclear antibody)  5. PCOS (polycystic ovarian syndrome)  6. Chronic tension-type headache, not intractable Magnesium and vit B12.    Preterm labor symptoms and general obstetric precautions including but not limited to vaginal bleeding, contractions, leaking of fluid and fetal movement were reviewed in detail with the patient. Please refer to After Visit Summary for other counseling recommendations.   No follow-ups on file.  Future Appointments  Date Time Provider [place]6/08/2021 10:15 AM StTruett MainlandDO CWH-WMHP None  06/20/2021 10:45 AM [facility]URSE [facility]14/2023 11:00 AM [facility]/22/2023 10:35 AM StTruett MainlandDO CWH-WMHP None  07/04/2021 10:45 AM [facility] NURSE [facility]/28/2023 11:00 AM [facility] US[facility]/05/2021 10:45 AM [facility] NURSE [facility]05/2021 11:00 AM [facility] US[facility]7/07/2021  3:00 PM Tobb, Kardie, DO CVD-WMC None  02/18/2022 10:40 AM Rice, ChResa MinerMD CR-[facility] None    JaTruett MainlandDO

## 2021-06-05 ENCOUNTER — Telehealth: Payer: Self-pay | Admitting: Obstetrics and Gynecology

## 2021-06-05 NOTE — Telephone Encounter (Signed)
Spoke with nurse line.   Pt is a 04/1019 who at 40w1dwith mild left side pain that is just above her hip. No radiation to her abdomen. Pain has been present for 1-2 hours. Using heating pad. No VB or LOF. She has not tried anything for pain.   Advised for them to suggest patient try tylenol or flexeril and fluids and if this doesn't help, or pain worsens, she should come to MAU for evaluation.   OB history reviewed - h/o FT SVD, uncomplicated in [address]40 2 SABs. CL at 23w was 3.38cm.   She is low risk for PTB.   PRadene Gunning MD Attending [facility]St[facility]

## 2021-06-07 ENCOUNTER — Telehealth: Payer: Self-pay

## 2021-06-07 NOTE — Telephone Encounter (Signed)
Pt states she had a gush of fluids during intercourse and wasn't sure if her water broke. Pt states, baby is moving well and she denies pain. She states she is no longer leaking fluids and states that it only happened during intercourse. Advised pt to continue to monitor symptoms and to go to [facility] to be seen if she has a gush of fluids or cramping. Understanding was voiced. Jazman Reuter l Cesia Orf, CMA

## 2021-06-14 ENCOUNTER — Ambulatory Visit (INDEPENDENT_AMBULATORY_CARE_PROVIDER_SITE_OTHER): Payer: BC Managed Care – PPO | Admitting: Family Medicine

## 2021-06-14 VITALS — BP 103/60 | HR 91 | Wt 231.0 lb

## 2021-06-14 DIAGNOSIS — Z3A29 29 weeks gestation of pregnancy: Secondary | ICD-10-CM | POA: Diagnosis not present

## 2021-06-14 DIAGNOSIS — O24410 Gestational diabetes mellitus in pregnancy, diet controlled: Secondary | ICD-10-CM

## 2021-06-14 DIAGNOSIS — O0990 Supervision of high risk pregnancy, unspecified, unspecified trimester: Secondary | ICD-10-CM

## 2021-06-14 DIAGNOSIS — R768 Other specified abnormal immunological findings in serum: Secondary | ICD-10-CM

## 2021-06-14 NOTE — Progress Notes (Signed)
   PRENATAL VISIT NOTE  Subjective:  Melanie Cruz is a 26 y.o. G4P1021 at 105w3dbeing seen today for ongoing prenatal care.  She is currently monitored for the following issues for this high-risk pregnancy and has Tobacco abuse counseling; Furunculosis of multiple sites; Avulsion fracture of tibial tuberosity; Migraine without aura and without status migrainosus, not intractable; Tension type headache; Prediabetes; PCOS (polycystic ovarian syndrome); Diarrhea; Chronic rhinitis; Supervision of high risk pregnancy, antepartum; Gestational diabetes mellitus (GDM), antepartum; Positive ANA (antinuclear antibody); Rash and other nonspecific skin eruption; Sedimentation rate elevation; and Patellar tendinitis of left knee on their problem list.  Patient reports  pelvic pressure .  Contractions: Not present. Vag. Bleeding: None.  Movement: Present. Denies leaking of fluid.   The following portions of the patient's history were reviewed and updated as appropriate: allergies, current medications, past family history, past medical history, past social history, past surgical history and problem list.   Objective:   Vitals:   06/14/21 1023  BP: 103/60  Pulse: 91  Weight: 231 lb (104.8 kg)    Fetal Status: Fetal Heart Rate (bpm): 145   Movement: Present     General:  Alert, oriented and cooperative. Patient is in no acute distress.  Skin: Skin is warm and dry. No rash noted.   Cardiovascular: Normal heart rate noted  Respiratory: Normal respiratory effort, no problems with respiration noted  Abdomen: Soft, gravid, appropriate for gestational age.  Pain/Pressure: Absent     Pelvic: Cervical exam deferred        Extremities: Normal range of motion.  Edema: None  Mental Status: Normal mood and affect. Normal behavior. Normal judgment and thought content.   Assessment and Plan:  Pregnancy: G4P1021 at 293w3d. Supervision of high risk pregnancy, antepartum - CBC - HIV antibody (with reflex) -  RPR  2. [redacted] weeks gestation of pregnancy - CBC - HIV antibody (with reflex) - RPR  3. Diet controlled gestational diabetes mellitus (GDM), antepartum Did not bring log book. Reports most fastings 90-95. Occasional elevated CBG.  PP mostly controlled. Has elevated ones occasionally due to diet. Growth next week.  4. Positive ANA (antinuclear antibody) Negative SS antibodies.  Preterm labor symptoms and general obstetric precautions including but not limited to vaginal bleeding, contractions, leaking of fluid and fetal movement were reviewed in detail with the patient. Please refer to After Visit Summary for other counseling recommendations.   No follow-ups on file.  Future Appointments  Date Time Provider DeMount [place]6/14/2023 10:45 AM WMC-MFC NURSE WMC-MFC [facility]6/14/2023 11:00 AM WMC-MFC US1 WMC[facility]MFCUS [facility]6/22/2023 10:35 AM StTruett MainlandDO CWH-WMHP None  07/04/2021 10:45 AM WMC-MFC NURSE [facility]MFC [facility]6/28/2023 11:00 AM WMC-MFC US1 WMC-MFCUS [facility]7/05/2021 10:45 AM WMC-MFC NURSE WMC-MFC [facility]7/05/2021 11:00 AM WMC-MFC US1 WMC-MFCUS [facility]7/07/2021  3:00 PM Tobb, Kardie, DO CVD-WMC None  07/19/2021 11:15 AM Anyanwu, UgSallyanne HaversMD CWH-WMHP None  08/01/2021 11:15 AM Anyanwu, UgSallyanne HaversMD CWH-WMHP None  08/09/2021 10:55 AM StTruett MainlandDO CWH-WMHP None  08/16/2021 10:55 AM StTruett MainlandDO CWH-WMHP None  08/23/2021 10:55 AM StTruett MainlandDO CWH-WMHP None  02/18/2022 10:40 AM RiBenjamine MolaChResa MinerMD CR-[facility] None    JaTruett MainlandDO

## 2021-06-15 LAB — CBC
Hematocrit: 33.9 % — ABNORMAL LOW (ref 34.0–46.6)
Hemoglobin: 11.2 g/dL (ref 11.1–15.9)
MCH: 25.5 pg — ABNORMAL LOW (ref 26.6–33.0)
MCHC: 33 g/dL (ref 31.5–35.7)
MCV: 77 fL — ABNORMAL LOW (ref 79–97)
Platelets: 185 10*3/uL (ref 150–450)
RBC: 4.39 x10E6/uL (ref 3.77–5.28)
RDW: 13.9 % (ref 11.7–15.4)
WBC: 7.9 10*3/uL (ref 3.4–10.8)

## 2021-06-15 LAB — HIV ANTIBODY (ROUTINE TESTING W REFLEX): HIV Screen 4th Generation wRfx: NONREACTIVE

## 2021-06-15 LAB — RPR: RPR Ser Ql: NONREACTIVE

## 2021-06-20 ENCOUNTER — Ambulatory Visit: Payer: BC Managed Care – PPO | Attending: Obstetrics

## 2021-06-20 ENCOUNTER — Ambulatory Visit: Payer: BC Managed Care – PPO | Admitting: *Deleted

## 2021-06-20 ENCOUNTER — Other Ambulatory Visit: Payer: Self-pay | Admitting: *Deleted

## 2021-06-20 ENCOUNTER — Encounter: Payer: Self-pay | Admitting: *Deleted

## 2021-06-20 VITALS — BP 102/61 | HR 84

## 2021-06-20 DIAGNOSIS — Z362 Encounter for other antenatal screening follow-up: Secondary | ICD-10-CM | POA: Diagnosis not present

## 2021-06-20 DIAGNOSIS — O3410 Maternal care for benign tumor of corpus uteri, unspecified trimester: Secondary | ICD-10-CM | POA: Diagnosis not present

## 2021-06-20 DIAGNOSIS — O0990 Supervision of high risk pregnancy, unspecified, unspecified trimester: Secondary | ICD-10-CM | POA: Diagnosis not present

## 2021-06-20 DIAGNOSIS — O24415 Gestational diabetes mellitus in pregnancy, controlled by oral hypoglycemic drugs: Secondary | ICD-10-CM

## 2021-06-20 DIAGNOSIS — E669 Obesity, unspecified: Secondary | ICD-10-CM | POA: Diagnosis not present

## 2021-06-20 DIAGNOSIS — O24113 Pre-existing diabetes mellitus, type 2, in pregnancy, third trimester: Secondary | ICD-10-CM

## 2021-06-20 DIAGNOSIS — O99213 Obesity complicating pregnancy, third trimester: Secondary | ICD-10-CM | POA: Diagnosis not present

## 2021-06-20 DIAGNOSIS — D259 Leiomyoma of uterus, unspecified: Secondary | ICD-10-CM

## 2021-06-20 DIAGNOSIS — Z3A30 30 weeks gestation of pregnancy: Secondary | ICD-10-CM

## 2021-06-25 ENCOUNTER — Other Ambulatory Visit: Payer: Self-pay | Admitting: Family Medicine

## 2021-06-25 DIAGNOSIS — O24415 Gestational diabetes mellitus in pregnancy, controlled by oral hypoglycemic drugs: Secondary | ICD-10-CM

## 2021-06-28 ENCOUNTER — Ambulatory Visit (INDEPENDENT_AMBULATORY_CARE_PROVIDER_SITE_OTHER): Payer: BC Managed Care – PPO | Admitting: Family Medicine

## 2021-06-28 VITALS — BP 98/55 | HR 68 | Wt 236.0 lb

## 2021-06-28 DIAGNOSIS — O0990 Supervision of high risk pregnancy, unspecified, unspecified trimester: Secondary | ICD-10-CM

## 2021-06-28 DIAGNOSIS — O24415 Gestational diabetes mellitus in pregnancy, controlled by oral hypoglycemic drugs: Secondary | ICD-10-CM

## 2021-06-28 DIAGNOSIS — Z3A31 31 weeks gestation of pregnancy: Secondary | ICD-10-CM

## 2021-06-28 DIAGNOSIS — R768 Other specified abnormal immunological findings in serum: Secondary | ICD-10-CM

## 2021-06-28 LAB — POCT URINALYSIS DIPSTICK OB
Bilirubin, UA: NEGATIVE
Blood, UA: NEGATIVE
Glucose, UA: NEGATIVE
Ketones, UA: NEGATIVE
Leukocytes, UA: NEGATIVE
Nitrite, UA: NEGATIVE
POC,PROTEIN,UA: NEGATIVE
Spec Grav, UA: 1.015 (ref 1.010–1.025)
Urobilinogen, UA: 0.2 E.U./dL
pH, UA: 6.5 (ref 5.0–8.0)

## 2021-06-28 NOTE — Progress Notes (Signed)
   PRENATAL VISIT NOTE  Subjective:  Melanie Cruz is a 26 y.o. G4P1021 at 29w3dbeing seen today for ongoing prenatal care.  She is currently monitored for the following issues for this high-risk pregnancy and has Tobacco abuse counseling; Furunculosis of multiple sites; Avulsion fracture of tibial tuberosity; Migraine without aura and without status migrainosus, not intractable; Tension type headache; Prediabetes; PCOS (polycystic ovarian syndrome); Diarrhea; Chronic rhinitis; Supervision of high risk pregnancy, antepartum; Gestational diabetes mellitus (GDM), antepartum; Positive ANA (antinuclear antibody); Rash and other nonspecific skin eruption; Sedimentation rate elevation; and Patellar tendinitis of left knee on their problem list.  Patient reports no complaints.  Contractions: Not present. Vag. Bleeding: None.  Movement: Present. Denies leaking of fluid.   The following portions of the patient's history were reviewed and updated as appropriate: allergies, current medications, past family history, past medical history, past social history, past surgical history and problem list.   Objective:   Vitals:   06/28/21 1046  BP: (!) 98/55  Pulse: 68  Weight: 236 lb (107 kg)    Fetal Status: Fetal Heart Rate (bpm): 145 Fundal Height: 34 cm Movement: Present     General:  Alert, oriented and cooperative. Patient is in no acute distress.  Skin: Skin is warm and dry. No rash noted.   Cardiovascular: Normal heart rate noted  Respiratory: Normal respiratory effort, no problems with respiration noted  Abdomen: Soft, gravid, appropriate for gestational age.  Pain/Pressure: Present     Pelvic: Cervical exam deferred        Extremities: Normal range of motion.  Edema: None  Mental Status: Normal mood and affect. Normal behavior. Normal judgment and thought content.   Assessment and Plan:  Pregnancy: G4P1021 at 388w3d. [redacted] weeks gestation of pregnancy - POC Urinalysis Dipstick OB  2.  Supervision of high risk pregnancy, antepartum   3. Gestational diabetes mellitus (GDM) controlled on oral hypoglycemic drug, antepartum Currently on metformin twice a day. Fasting blood sugars under better control Has not been checking CBGs after lunch and dinner - discussed improvement of checks.  EFW 6/14 - 63% On ASA '81mg'$  BPPs weekly - POC Urinalysis Dipstick OB  4. Positive ANA (antinuclear antibody) Has been referred to rheumatology.  Preterm labor symptoms and general obstetric precautions including but not limited to vaginal bleeding, contractions, leaking of fluid and fetal movement were reviewed in detail with the patient. Please refer to After Visit Summary for other counseling recommendations.   No follow-ups on file.  Future Appointments  Date Time Provider DeWay[place]6/28/2023 10:45 AM WMC-MFC NURSE WMC-MFC [facility]6/28/2023 11:00 AM WMC-MFC US1 WMC[facility]MFCUS [facility]7/05/2021 10:45 AM WMC-MFC NURSE WMC-MFC WMD[place]7/05/2021 11:00 AM WMC-MFC US1 WMC-MFCUS WMAs[facility]7/07/2021  3:00 PM Tobb, Kardie, DO CVD-WMC None  07/17/2021 10:15 AM WMC-MFC NURSE WMC-MFC WMAdv[facility]7/11/2021 10:30 AM WMC-MFC US2 WMC-MFCUS WMS[facility]7/13/2023 11:15 AM StTruett MainlandDO CWH-WMHP None  07/25/2021 10:30 AM WMC-MFC NURSE WMC-MFC WM[facility]7/19/2023 10:45 AM WMC-MFC US6 WMC-MFCUS WMC[facility]7/26/2023 11:15 AM StNehemiah SettleJaTanna SavoyDO CWH-WMHP None  08/09/2021 10:55 AM StTruett MainlandDO CWH-WMHP None  08/16/2021 10:55 AM StTruett MainlandDO CWH-WMHP None  08/23/2021 10:55 AM StTruett MainlandDO CWH-WMHP None  02/18/2022 10:40 AM RiBenjamine MolaChResa MinerMD CR-[facility] None    JaTruett MainlandDO

## 2021-07-04 ENCOUNTER — Encounter: Payer: Self-pay | Admitting: *Deleted

## 2021-07-04 ENCOUNTER — Ambulatory Visit: Payer: BC Managed Care – PPO | Admitting: *Deleted

## 2021-07-04 ENCOUNTER — Ambulatory Visit: Payer: BC Managed Care – PPO | Attending: Obstetrics and Gynecology

## 2021-07-04 VITALS — BP 106/49 | HR 86

## 2021-07-04 DIAGNOSIS — O24415 Gestational diabetes mellitus in pregnancy, controlled by oral hypoglycemic drugs: Secondary | ICD-10-CM

## 2021-07-04 DIAGNOSIS — Z3689 Encounter for other specified antenatal screening: Secondary | ICD-10-CM | POA: Diagnosis not present

## 2021-07-04 DIAGNOSIS — D219 Benign neoplasm of connective and other soft tissue, unspecified: Secondary | ICD-10-CM | POA: Insufficient documentation

## 2021-07-04 DIAGNOSIS — D259 Leiomyoma of uterus, unspecified: Secondary | ICD-10-CM

## 2021-07-04 DIAGNOSIS — O99213 Obesity complicating pregnancy, third trimester: Secondary | ICD-10-CM

## 2021-07-04 DIAGNOSIS — O0990 Supervision of high risk pregnancy, unspecified, unspecified trimester: Secondary | ICD-10-CM

## 2021-07-04 DIAGNOSIS — O3410 Maternal care for benign tumor of corpus uteri, unspecified trimester: Secondary | ICD-10-CM | POA: Diagnosis not present

## 2021-07-04 DIAGNOSIS — O24419 Gestational diabetes mellitus in pregnancy, unspecified control: Secondary | ICD-10-CM | POA: Diagnosis not present

## 2021-07-04 DIAGNOSIS — E669 Obesity, unspecified: Secondary | ICD-10-CM

## 2021-07-04 DIAGNOSIS — Z3A32 32 weeks gestation of pregnancy: Secondary | ICD-10-CM

## 2021-07-11 ENCOUNTER — Encounter: Payer: Self-pay | Admitting: *Deleted

## 2021-07-11 ENCOUNTER — Ambulatory Visit: Payer: BC Managed Care – PPO | Attending: Obstetrics and Gynecology

## 2021-07-11 ENCOUNTER — Ambulatory Visit: Payer: BC Managed Care – PPO | Admitting: *Deleted

## 2021-07-11 VITALS — BP 101/60 | HR 80

## 2021-07-11 DIAGNOSIS — O24419 Gestational diabetes mellitus in pregnancy, unspecified control: Secondary | ICD-10-CM | POA: Diagnosis not present

## 2021-07-11 DIAGNOSIS — O3413 Maternal care for benign tumor of corpus uteri, third trimester: Secondary | ICD-10-CM | POA: Diagnosis not present

## 2021-07-11 DIAGNOSIS — D219 Benign neoplasm of connective and other soft tissue, unspecified: Secondary | ICD-10-CM | POA: Diagnosis not present

## 2021-07-11 DIAGNOSIS — O99213 Obesity complicating pregnancy, third trimester: Secondary | ICD-10-CM

## 2021-07-11 DIAGNOSIS — E669 Obesity, unspecified: Secondary | ICD-10-CM

## 2021-07-11 DIAGNOSIS — O24415 Gestational diabetes mellitus in pregnancy, controlled by oral hypoglycemic drugs: Secondary | ICD-10-CM

## 2021-07-11 DIAGNOSIS — Z3689 Encounter for other specified antenatal screening: Secondary | ICD-10-CM | POA: Insufficient documentation

## 2021-07-11 DIAGNOSIS — O0990 Supervision of high risk pregnancy, unspecified, unspecified trimester: Secondary | ICD-10-CM | POA: Diagnosis not present

## 2021-07-11 DIAGNOSIS — Z3A33 33 weeks gestation of pregnancy: Secondary | ICD-10-CM

## 2021-07-13 ENCOUNTER — Encounter: Payer: Self-pay | Admitting: Cardiology

## 2021-07-13 ENCOUNTER — Ambulatory Visit (INDEPENDENT_AMBULATORY_CARE_PROVIDER_SITE_OTHER): Payer: BC Managed Care – PPO | Admitting: Cardiology

## 2021-07-13 VITALS — BP 106/80 | HR 89 | Ht 65.0 in | Wt 234.3 lb

## 2021-07-13 DIAGNOSIS — O99210 Obesity complicating pregnancy, unspecified trimester: Secondary | ICD-10-CM | POA: Diagnosis not present

## 2021-07-13 DIAGNOSIS — Z87891 Personal history of nicotine dependence: Secondary | ICD-10-CM

## 2021-07-13 DIAGNOSIS — R7303 Prediabetes: Secondary | ICD-10-CM

## 2021-07-13 DIAGNOSIS — O24410 Gestational diabetes mellitus in pregnancy, diet controlled: Secondary | ICD-10-CM | POA: Diagnosis not present

## 2021-07-13 DIAGNOSIS — F17200 Nicotine dependence, unspecified, uncomplicated: Secondary | ICD-10-CM | POA: Diagnosis not present

## 2021-07-13 DIAGNOSIS — R002 Palpitations: Secondary | ICD-10-CM

## 2021-07-13 NOTE — Progress Notes (Signed)
Cardio-Obstetrics Clinic  Follow Up Note   Date:  07/13/2021   ID:  Melanie Cruz, DOB 1995-11-04, MRN [id number]  PCP:  Elise Benne   [facility] Providers Cardiologist:  None  Electrophysiologist:  None        Referring MD: Mackie Pai, PA-C   Chief Complaint:   History of Present Illness:    Melanie Cruz is a 26 y.o. female [G4P1021] who returns for follow up.  Medical history includes prediabetes, gestational diabetes, obesity, smoker.  At her last visit which was on April 20, 2021 she was experiencing palpitation we will place a ZIO monitor on the patient.  Also counseled patient against smoking.    Since her visit she had presented monitor patient of  arrhythmia.  She is here today for follow-up visit.  She has no complaints.  She is excited she tells me that she stopped smoking.  I am very happy for the patient.  I congratulated her during this time.   Prior CV Studies Reviewed: The following studies were reviewed today:  Zio monitor  Patch Wear Time:  6 days and 18 hours (2023-04-14T15:49:05-398 to 2023-04-21T10:02:13-0400)   Patient had a min HR of 66 bpm, max HR of 156 bpm, and avg HR of 94 bpm. Predominant underlying rhythm was Sinus Rhythm. Isolated SVEs were rare (<1.0%), and no SVE Couplets or SVE Triplets were present. No Isolated VEs, VE Couplets, or VE Triplets were  present.    Symptoms associated with sinus tachycardia.   Conclusion: Normal/unremarkable study with no significant arrhythmia.  Past Medical History:  Diagnosis Date   COVID-19    Migraine     Past Surgical History:  Procedure Laterality Date   NO PAST SURGERIES        OB History     Gravida  4   Para  1   Term  1   Preterm      AB  2   Living  1      SAB      IAB  2   Ectopic      Multiple  0   Live Births  1               Current Medications: Current Meds  Medication Sig   acetaminophen (TYLENOL) 325 MG tablet Take 650 mg  by mouth every 6 (six) hours as needed.   aspirin EC 81 MG tablet Take 1 tablet (81 mg total) by mouth daily. Take after 12 weeks for prevention of preeclampsia later in pregnancy   busPIRone (BUSPAR) 5 MG tablet Take 1 tablet (5 mg total) by mouth 2 (two) times daily.   clindamycin (CLINDAGEL) 1 % gel Apply topically 2 (two) times daily.   Continuous Blood Gluc Sensor (DEXCOM G6 SENSOR) MISC 1 Units by Does not apply route as directed. Change every 10 days.   Continuous Blood Gluc Transmit (DEXCOM G6 TRANSMITTER) MISC 1 Units by Does not apply route as directed. Change every 90 days   cyclobenzaprine (FLEXERIL) 10 MG tablet Take 0.5-1 tablets (5-10 mg total) by mouth 3 (three) times daily as needed for muscle spasms.   fluticasone (FLONASE) 50 MCG/ACT nasal spray Place into the nose as needed.   Magnesium 250 MG TABS Take by mouth.   metFORMIN (GLUCOPHAGE) 500 MG tablet TAKE 1 TABLET BY MOUTH TWICE A DAY   Prenatal Vit-Fe Fumarate-FA (PRENATAL VITAMINS PO) Take by mouth.   triamcinolone (KENALOG) 0.025 % ointment Apply 1 application. topically  2 (two) times daily.     Allergies:   Azithromycin and Cefdinir   Social History   Socioeconomic History   Marital status: Single    Spouse name: Not on file   Number of children: Not on file   Years of education: Not on file   Highest education level: Not on file  Occupational History   Not on file  Tobacco Use   Smoking status: Every Day    Packs/day: 0.10    Years: 5.00    Total pack years: 0.50    Types: Cigarettes   Smokeless tobacco: Never   Tobacco comments:    2 cig daily  Vaping Use   Vaping Use: Former   Substances: Flavoring  Substance and Sexual Activity   Alcohol use: Not Currently   Drug use: Not Currently    Types: Marijuana    Comment: occasional-last use 05/23/2017   Sexual activity: Yes    Birth control/protection: Inserts    Comment: nuvaring   Other Topics Concern   Not on file  Social History Narrative    Lives with roommate   Right handed   Caffeine: 0-1 cup/day   Social Determinants of Health   Financial Resource Strain: Not on file  Food Insecurity: No Food Insecurity (07/13/2021)   Hunger Vital Sign    Worried About Running Out of Food in the Last Year: Never true    Ran Out of Food in the Last Year: Never true  Transportation Needs: No Transportation Needs (07/13/2021)   PRAPARE - Hydrologist (Medical): No    Lack of Transportation (Non-Medical): No  Physical Activity: Not on file  Stress: Not on file  Social Connections: Not on file      Family History  Problem Relation Age of Onset   Hypertension Mother    Diabetes Mother    Breast cancer Paternal Aunt        age at onset 4's   Migraines Neg Hx       ROS:   Please see the history of present illness.     All other systems reviewed and are negative.   Labs/EKG Reviewed:    EKG:   EKG is was not  ordered today.   Recent Labs: 04/30/2021: ALT 18; BUN 8; Creatinine, Ser 0.57; Potassium 3.6; Sodium 136 06/14/2021: Hemoglobin 11.2; Platelets 185   Recent Lipid Panel Lab Results  Component Value Date/Time   CHOL 196 05/09/2020 12:01 PM   TRIG 122.0 05/09/2020 12:01 PM   HDL 40.10 05/09/2020 12:01 PM   CHOLHDL 5 05/09/2020 12:01 PM   LDLCALC 131 (H) 05/09/2020 12:01 PM    Physical Exam:    VS:  BP 106/80   Pulse 89   Ht '5\' 5"'$  (1.651 m)   Wt 234 lb 4.8 oz (106.3 kg)   LMP 11/14/2020 (Exact Date)   SpO2 98%   BMI 38.99 kg/m     Wt Readings from Last 3 Encounters:  07/13/21 234 lb 4.8 oz (106.3 kg)  06/28/21 236 lb (107 kg)  06/14/21 231 lb (104.8 kg)     GEN:  Well nourished, well developed in no acute distress HEENT: Normal NECK: No JVD; No carotid bruits LYMPHATICS: No lymphadenopathy CARDIAC: RRR, no murmurs, rubs, gallops RESPIRATORY:  Clear to auscultation without rales, wheezing or rhonchi  ABDOMEN: Soft, non-tender, non-distended MUSCULOSKELETAL:  No edema; No  deformity  SKIN: Warm and dry NEUROLOGIC:  Alert and oriented x 3 PSYCHIATRIC:  Normal  affect    Risk Assessment/Risk Calculators:     CARPREG II Risk Prediction Index Score:  1.  The patient's risk for a primary cardiac event is 5%.            ASSESSMENT & PLAN:    Gestational diabetes Prediabetes Obesity Smoker-recently quit  We talked about her monitor did not show any evidence of stroke Palpitations has improved. She is on aspirin for preeclampsia prophylaxis, I encouraged the patient to continue this plan. The patient understands the need to lose weight with diet and exercise. We have discussed specific strategies for this. She has quit smoking at [facility] the patient for this.   She will need to be monitored closely for preventative cardiovascular risk reduction.  The patient is in agreement with the above plan. The patient left the office in stable condition.  The patient will follow up in 16 weeks or sooner if needed.    Patient Instructions  Medication Instructions:  Your physician recommends that you continue on your current medications as directed. Please refer to the Current Medication list given to you today.  *If you need a refill on your cardiac medications before your next appointment, please call your pharmacy*   Lab Work: NONE If you have labs (blood work) drawn today and your tests are completely normal, you will receive your results only by: [place] (if you have MyChart) OR A paper copy in the mail If you have any lab test that is abnormal or we need to change your treatment, we will call you to review the results.   Testing/Procedures: NONE   Follow-Up: At [facility], you and your health needs are our priority.  As part of our continuing mission to provide you with exceptional heart care, we have created designated Provider Care Teams.  These Care Teams include your primary Cardiologist (physician) and Advanced Practice  Providers (APPs -  Physician Assistants and Nurse Practitioners) who all work together to provide you with the care you need, when you need it.  Your next appointment:   16 week(s)  The format for your next appointment:   In Person  Provider:   Berniece Salines  [facility] [address], [place], [place] [address] {    Dispo:  Return in about 16 weeks (around 11/02/2021).   Medication Adjustments/Labs and Tests Ordered: Current medicines are reviewed at length with the patient today.  Concerns regarding medicines are outlined above.  Tests Ordered: No orders of the defined types were placed in this encounter.  Medication Changes: No orders of the defined types were placed in this encounter.

## 2021-07-13 NOTE — Patient Instructions (Signed)
Medication Instructions:  Your physician recommends that you continue on your current medications as directed. Please refer to the Current Medication list given to you today.  *If you need a refill on your cardiac medications before your next appointment, please call your pharmacy*   Lab Work: NONE If you have labs (blood work) drawn today and your tests are completely normal, you will receive your results only by: [place] (if you have MyChart) OR A paper copy in the mail If you have any lab test that is abnormal or we need to change your treatment, we will call you to review the results.   Testing/Procedures: NONE   Follow-Up: At [facility], you and your health needs are our priority.  As part of our continuing mission to provide you with exceptional heart care, we have created designated Provider Care Teams.  These Care Teams include your primary Cardiologist (physician) and Advanced Practice Providers (APPs -  Physician Assistants and Nurse Practitioners) who all work together to provide you with the care you need, when you need it.  Your next appointment:   16 week(s)  The format for your next appointment:   In Person  Provider:   Berniece Salines  [facility] [address], [place], [place] [address] {

## 2021-07-17 ENCOUNTER — Ambulatory Visit: Payer: BC Managed Care – PPO | Admitting: *Deleted

## 2021-07-17 ENCOUNTER — Encounter: Payer: Self-pay | Admitting: *Deleted

## 2021-07-17 ENCOUNTER — Ambulatory Visit: Payer: BC Managed Care – PPO | Attending: Obstetrics

## 2021-07-17 VITALS — BP 119/65 | HR 79

## 2021-07-17 DIAGNOSIS — O99213 Obesity complicating pregnancy, third trimester: Secondary | ICD-10-CM | POA: Diagnosis not present

## 2021-07-17 DIAGNOSIS — Z362 Encounter for other antenatal screening follow-up: Secondary | ICD-10-CM

## 2021-07-17 DIAGNOSIS — O0990 Supervision of high risk pregnancy, unspecified, unspecified trimester: Secondary | ICD-10-CM | POA: Diagnosis not present

## 2021-07-17 DIAGNOSIS — O24113 Pre-existing diabetes mellitus, type 2, in pregnancy, third trimester: Secondary | ICD-10-CM | POA: Insufficient documentation

## 2021-07-17 DIAGNOSIS — E669 Obesity, unspecified: Secondary | ICD-10-CM | POA: Diagnosis not present

## 2021-07-17 DIAGNOSIS — O24415 Gestational diabetes mellitus in pregnancy, controlled by oral hypoglycemic drugs: Secondary | ICD-10-CM | POA: Diagnosis not present

## 2021-07-17 DIAGNOSIS — O3413 Maternal care for benign tumor of corpus uteri, third trimester: Secondary | ICD-10-CM

## 2021-07-17 DIAGNOSIS — Z3A34 34 weeks gestation of pregnancy: Secondary | ICD-10-CM

## 2021-07-17 DIAGNOSIS — D259 Leiomyoma of uterus, unspecified: Secondary | ICD-10-CM

## 2021-07-19 ENCOUNTER — Encounter: Payer: Self-pay | Admitting: General Practice

## 2021-07-19 ENCOUNTER — Ambulatory Visit (INDEPENDENT_AMBULATORY_CARE_PROVIDER_SITE_OTHER): Payer: BC Managed Care – PPO | Admitting: Family Medicine

## 2021-07-19 VITALS — BP 101/60 | HR 102 | Wt 234.0 lb

## 2021-07-19 DIAGNOSIS — Z3A34 34 weeks gestation of pregnancy: Secondary | ICD-10-CM

## 2021-07-19 DIAGNOSIS — R768 Other specified abnormal immunological findings in serum: Secondary | ICD-10-CM

## 2021-07-19 DIAGNOSIS — O24410 Gestational diabetes mellitus in pregnancy, diet controlled: Secondary | ICD-10-CM

## 2021-07-19 DIAGNOSIS — O0990 Supervision of high risk pregnancy, unspecified, unspecified trimester: Secondary | ICD-10-CM

## 2021-07-19 DIAGNOSIS — O0993 Supervision of high risk pregnancy, unspecified, third trimester: Secondary | ICD-10-CM

## 2021-07-19 DIAGNOSIS — O99213 Obesity complicating pregnancy, third trimester: Secondary | ICD-10-CM

## 2021-07-19 DIAGNOSIS — O99210 Obesity complicating pregnancy, unspecified trimester: Secondary | ICD-10-CM

## 2021-07-19 NOTE — Progress Notes (Signed)
   PRENATAL VISIT NOTE  Subjective:  Melanie Cruz is a 26 y.o. G4P1021 at 38w3dbeing seen today for ongoing prenatal care.  She is currently monitored for the following issues for this high-risk pregnancy and has Tobacco abuse counseling; Furunculosis of multiple sites; Avulsion fracture of tibial tuberosity; Migraine without aura and without status migrainosus, not intractable; Tension type headache; Prediabetes; PCOS (polycystic ovarian syndrome); Diarrhea; Chronic rhinitis; Supervision of high risk pregnancy, antepartum; Gestational diabetes mellitus (GDM), antepartum; Positive ANA (antinuclear antibody); Rash and other nonspecific skin eruption; Sedimentation rate elevation; Patellar tendinitis of left knee; and Obesity in pregnancy, antepartum on their problem list.  Patient reports no complaints.  Contractions: Not present. Vag. Bleeding: None.  Movement: Present. Denies leaking of fluid.   The following portions of the patient's history were reviewed and updated as appropriate: allergies, current medications, past family history, past medical history, past social history, past surgical history and problem list.   Objective:   Vitals:   07/19/21 1126  BP: 101/60  Pulse: (!) 102  Weight: 234 lb (106.1 kg)    Fetal Status:     Movement: Present     General:  Alert, oriented and cooperative. Patient is in no acute distress.  Skin: Skin is warm and dry. No rash noted.   Cardiovascular: Normal heart rate noted  Respiratory: Normal respiratory effort, no problems with respiration noted  Abdomen: Soft, gravid, appropriate for gestational age.  Pain/Pressure: Present     Pelvic: Cervical exam deferred        Extremities: Normal range of motion.  Edema: None  Mental Status: Normal mood and affect. Normal behavior. Normal judgment and thought content.   Assessment and Plan:  Pregnancy: G4P1021 at 344w3d. Supervision of high risk pregnancy, antepartum FHT and FH normal  2. Diet  controlled gestational diabetes mellitus (GDM), antepartum After dinner and fastings elevated. Patient only taking metformin in AM.  Increase metformin to BID with meals. Last growth 7/11 - efw 65%  3. Obesity in pregnancy, antepartum  4. Positive ANA (antinuclear antibody) Has rheumatologist SSA, SSB normal  Preterm labor symptoms and general obstetric precautions including but not limited to vaginal bleeding, contractions, leaking of fluid and fetal movement were reviewed in detail with the patient. Please refer to After Visit Summary for other counseling recommendations.   No follow-ups on file.  Future Appointments  Date Time Provider De[place]7/19/2023 10:30 AM WMC-MFC NURSE WMC-MFC WMPr[facility]7/19/2023 10:45 AM WMC-MFC US6 WMC-MFCUS [facility]7/26/2023 11:15 AM StTruett MainlandDO CWH-WMHP None  08/02/2021  3:15 PM WMC-WOCA NST WMI[facility] Center[facility]8/02/2021 10:15 AM WMC-WOCA NST WMPres[facility]8/03/2021 10:55 AM StNehemiah SettleJaTanna SavoyDO CWH-WMHP None  08/16/2021 10:55 AM StTruett MainlandDO CWH-WMHP None  08/23/2021 10:55 AM StTruett MainlandDO CWH-WMHP None  02/18/2022 10:40 AM RiBenjamine MolaChResa MinerMD CR-[facility] None    JaTruett MainlandDO

## 2021-07-25 ENCOUNTER — Ambulatory Visit: Payer: BC Managed Care – PPO | Admitting: *Deleted

## 2021-07-25 ENCOUNTER — Other Ambulatory Visit: Payer: Self-pay | Admitting: *Deleted

## 2021-07-25 ENCOUNTER — Ambulatory Visit: Payer: BC Managed Care – PPO | Attending: Obstetrics

## 2021-07-25 VITALS — BP 105/69 | HR 89

## 2021-07-25 DIAGNOSIS — O24415 Gestational diabetes mellitus in pregnancy, controlled by oral hypoglycemic drugs: Secondary | ICD-10-CM

## 2021-07-25 DIAGNOSIS — Z3A35 35 weeks gestation of pregnancy: Secondary | ICD-10-CM

## 2021-07-25 DIAGNOSIS — O99213 Obesity complicating pregnancy, third trimester: Secondary | ICD-10-CM | POA: Diagnosis not present

## 2021-07-25 DIAGNOSIS — R768 Other specified abnormal immunological findings in serum: Secondary | ICD-10-CM

## 2021-07-25 DIAGNOSIS — D259 Leiomyoma of uterus, unspecified: Secondary | ICD-10-CM

## 2021-07-25 DIAGNOSIS — O3413 Maternal care for benign tumor of corpus uteri, third trimester: Secondary | ICD-10-CM

## 2021-07-25 DIAGNOSIS — O0990 Supervision of high risk pregnancy, unspecified, unspecified trimester: Secondary | ICD-10-CM | POA: Insufficient documentation

## 2021-07-25 DIAGNOSIS — O24113 Pre-existing diabetes mellitus, type 2, in pregnancy, third trimester: Secondary | ICD-10-CM | POA: Diagnosis not present

## 2021-07-25 DIAGNOSIS — E669 Obesity, unspecified: Secondary | ICD-10-CM | POA: Diagnosis not present

## 2021-08-01 ENCOUNTER — Ambulatory Visit (INDEPENDENT_AMBULATORY_CARE_PROVIDER_SITE_OTHER): Payer: BC Managed Care – PPO | Admitting: Family Medicine

## 2021-08-01 ENCOUNTER — Other Ambulatory Visit (HOSPITAL_COMMUNITY)
Admission: RE | Admit: 2021-08-01 | Discharge: 2021-08-01 | Disposition: A | Discharge status: Home / Self Care | Payer: BC Managed Care – PPO | Source: Ambulatory Visit | Attending: Obstetrics & Gynecology | Admitting: Obstetrics & Gynecology

## 2021-08-01 ENCOUNTER — Other Ambulatory Visit: Payer: BC Managed Care – PPO

## 2021-08-01 VITALS — BP 103/63 | HR 86 | Wt 235.0 lb

## 2021-08-01 DIAGNOSIS — Z3A36 36 weeks gestation of pregnancy: Secondary | ICD-10-CM

## 2021-08-01 DIAGNOSIS — O0990 Supervision of high risk pregnancy, unspecified, unspecified trimester: Secondary | ICD-10-CM | POA: Insufficient documentation

## 2021-08-01 DIAGNOSIS — R768 Other specified abnormal immunological findings in serum: Secondary | ICD-10-CM

## 2021-08-01 DIAGNOSIS — O24415 Gestational diabetes mellitus in pregnancy, controlled by oral hypoglycemic drugs: Secondary | ICD-10-CM

## 2021-08-01 NOTE — Progress Notes (Signed)
   PRENATAL VISIT NOTE  Subjective:  Melanie Cruz is a 26 y.o. G4P1021 at 55w2dbeing seen today for ongoing prenatal care.  She is currently monitored for the following issues for this high-risk pregnancy and has Tobacco abuse counseling; Furunculosis of multiple sites; Avulsion fracture of tibial tuberosity; Migraine without aura and without status migrainosus, not intractable; Tension type headache; Prediabetes; PCOS (polycystic ovarian syndrome); Diarrhea; Chronic rhinitis; Supervision of high risk pregnancy, antepartum; Gestational diabetes mellitus (GDM), antepartum; Positive ANA (antinuclear antibody); Rash and other nonspecific skin eruption; Sedimentation rate elevation; Patellar tendinitis of left knee; and Obesity in pregnancy, antepartum on their problem list.  Patient reports vaginal irritation.  Contractions: Not present. Vag. Bleeding: None.  Movement: Present. Denies leaking of fluid.   The following portions of the patient's history were reviewed and updated as appropriate: allergies, current medications, past family history, past medical history, past social history, past surgical history and problem list.   Objective:   Vitals:   08/01/21 1121  BP: 103/63  Pulse: 86  Weight: 235 lb (106.6 kg)    Fetal Status: Fetal Heart Rate (bpm): 152 Fundal Height: 38 cm Movement: Present  Presentation: Vertex  General:  Alert, oriented and cooperative. Patient is in no acute distress.  Skin: Skin is warm and dry. No rash noted.   Cardiovascular: Normal heart rate noted  Respiratory: Normal respiratory effort, no problems with respiration noted  Abdomen: Soft, gravid, appropriate for gestational age.  Pain/Pressure: Present     Pelvic: Cervical exam performed in the presence of a chaperone Dilation: Closed Effacement (%): Thick    Extremities: Normal range of motion.  Edema: None  Mental Status: Normal mood and affect. Normal behavior. Normal judgment and thought content.    Assessment and Plan:  Pregnancy: G4P1021 at 338w2d. [redacted] weeks gestation of pregnancy  2. Supervision of high risk pregnancy, antepartum FHT and FH normal  3. Gestational diabetes mellitus (GDM) controlled on oral hypoglycemic drug, antepartum CBGs improved with metformin BID. BPP weekly Growth USKoreacheduled for 8/9 [place] at 39 weeks  4. Positive ANA (antinuclear antibody)   Preterm labor symptoms and general obstetric precautions including but not limited to vaginal bleeding, contractions, leaking of fluid and fetal movement were reviewed in detail with the patient. Please refer to After Visit Summary for other counseling recommendations.   No follow-ups on file.  Future Appointments  Date Time Provider DeKermit7/27/2023  3:15 PM WMWest Suburban Eye Surgery Center LLCST WMSouthern Crescent [facility] PcMInter[facility]8/02/2021 10:15 AM WMC-WOCA NST WMMid [facility] Surgery CenterMAdvanced [facility]8/03/2021 10:55 AM StTruett MainlandDO CWH-WMHP None  08/15/2021 10:15 AM WMC-MFC NURSE WMC-MFC WMM[facility]8/09/2021 10:30 AM WMC-MFC US2 WMC-MFCUS WMLong Island Amb[facility] LLC8/10/2021 10:55 AM StNehemiah SettleJaTanna SavoyDO CWH-WMHP None  08/21/2021  7:00 AM MC-LD SCNavajo MountainC-INDC None  08/23/2021 10:55 AM StTruett MainlandDO CWH-WMHP None  02/18/2022 10:40 AM RiBenjamine MolaChResa MinerMD CR-[facility] None    JaTruett MainlandDO

## 2021-08-02 ENCOUNTER — Ambulatory Visit (INDEPENDENT_AMBULATORY_CARE_PROVIDER_SITE_OTHER): Payer: BC Managed Care – PPO

## 2021-08-02 ENCOUNTER — Ambulatory Visit: Payer: BC Managed Care – PPO | Admitting: *Deleted

## 2021-08-02 VITALS — BP 114/67 | HR 79

## 2021-08-02 DIAGNOSIS — O24415 Gestational diabetes mellitus in pregnancy, controlled by oral hypoglycemic drugs: Secondary | ICD-10-CM

## 2021-08-02 LAB — CERVICOVAGINAL ANCILLARY ONLY
Bacterial Vaginitis (gardnerella): NEGATIVE
Candida Glabrata: NEGATIVE
Candida Vaginitis: NEGATIVE
Chlamydia: NEGATIVE
Comment: NEGATIVE
Comment: NEGATIVE
Comment: NEGATIVE
Comment: NEGATIVE
Comment: NORMAL
Neisseria Gonorrhea: NEGATIVE

## 2021-08-02 NOTE — Progress Notes (Signed)

## 2021-08-05 LAB — CULTURE, BETA STREP (GROUP B ONLY): Strep Gp B Culture: POSITIVE — AB

## 2021-08-06 ENCOUNTER — Encounter: Payer: Self-pay | Admitting: Family Medicine

## 2021-08-06 DIAGNOSIS — O99820 Streptococcus B carrier state complicating pregnancy: Secondary | ICD-10-CM | POA: Insufficient documentation

## 2021-08-07 ENCOUNTER — Telehealth (HOSPITAL_COMMUNITY): Payer: Self-pay | Admitting: *Deleted

## 2021-08-07 ENCOUNTER — Encounter (HOSPITAL_COMMUNITY): Payer: Self-pay | Admitting: *Deleted

## 2021-08-07 ENCOUNTER — Telehealth: Payer: Self-pay

## 2021-08-07 NOTE — Telephone Encounter (Signed)
Patient called and worried that she is GBS+ and still having a vaginal odor. Explained to patient that GBS does not affect vaginal odor and that her cultures for BV, yeast and GC/Chl were all negative.  Encourage patient that it is hot summer months and she can try washing with water and dove unscented soap daily.  Patient states she is still worried because she has an odor- patient has upcoming appointment and encouraged her to mention at that appointment. Kathrene Alu  RN

## 2021-08-07 NOTE — Telephone Encounter (Signed)
Preadmission screen  

## 2021-08-08 ENCOUNTER — Ambulatory Visit: Payer: BC Managed Care – PPO | Admitting: *Deleted

## 2021-08-08 ENCOUNTER — Ambulatory Visit (INDEPENDENT_AMBULATORY_CARE_PROVIDER_SITE_OTHER): Payer: BC Managed Care – PPO

## 2021-08-08 ENCOUNTER — Other Ambulatory Visit: Payer: BC Managed Care – PPO

## 2021-08-08 ENCOUNTER — Other Ambulatory Visit: Payer: Self-pay

## 2021-08-08 VITALS — BP 111/69 | HR 77

## 2021-08-08 DIAGNOSIS — O24415 Gestational diabetes mellitus in pregnancy, controlled by oral hypoglycemic drugs: Secondary | ICD-10-CM

## 2021-08-08 NOTE — Progress Notes (Signed)

## 2021-08-09 ENCOUNTER — Ambulatory Visit (INDEPENDENT_AMBULATORY_CARE_PROVIDER_SITE_OTHER): Payer: BC Managed Care – PPO | Admitting: Family Medicine

## 2021-08-09 VITALS — BP 112/55 | HR 67 | Wt 240.0 lb

## 2021-08-09 DIAGNOSIS — O0990 Supervision of high risk pregnancy, unspecified, unspecified trimester: Secondary | ICD-10-CM

## 2021-08-09 DIAGNOSIS — R768 Other specified abnormal immunological findings in serum: Secondary | ICD-10-CM

## 2021-08-09 DIAGNOSIS — O24415 Gestational diabetes mellitus in pregnancy, controlled by oral hypoglycemic drugs: Secondary | ICD-10-CM

## 2021-08-09 DIAGNOSIS — O99820 Streptococcus B carrier state complicating pregnancy: Secondary | ICD-10-CM

## 2021-08-09 NOTE — Progress Notes (Signed)
   PRENATAL VISIT NOTE  Subjective:  Melanie Cruz is a 26 y.o. G4P1021 at 62w3dbeing seen today for ongoing prenatal care.  She is currently monitored for the following issues for this high-risk pregnancy and has Tobacco abuse counseling; Furunculosis of multiple sites; Avulsion fracture of tibial tuberosity; Migraine without aura and without status migrainosus, not intractable; Tension type headache; Prediabetes; PCOS (polycystic ovarian syndrome); Diarrhea; Chronic rhinitis; Supervision of high risk pregnancy, antepartum; Gestational diabetes mellitus (GDM), antepartum; Positive ANA (antinuclear antibody); Rash and other nonspecific skin eruption; Sedimentation rate elevation; Patellar tendinitis of left knee; Obesity in pregnancy, antepartum; and GBS (group B Streptococcus carrier), +RV culture, currently pregnant on their problem list.  Patient reports no complaints.  Contractions: Irritability. Vag. Bleeding: None.  Movement: Present. Denies leaking of fluid.   The following portions of the patient's history were reviewed and updated as appropriate: allergies, current medications, past family history, past medical history, past social history, past surgical history and problem list.   Objective:   Vitals:   08/09/21 1105  BP: (!) 112/55  Pulse: 67  Weight: 240 lb (108.9 kg)    Fetal Status:     Movement: Present     General:  Alert, oriented and cooperative. Patient is in no acute distress.  Skin: Skin is warm and dry. No rash noted.   Cardiovascular: Normal heart rate noted  Respiratory: Normal respiratory effort, no problems with respiration noted  Abdomen: Soft, gravid, appropriate for gestational age.  Pain/Pressure: Present     Pelvic: Cervical exam deferred        Extremities: Normal range of motion.  Edema: None  Mental Status: Normal mood and affect. Normal behavior. Normal judgment and thought content.   Assessment and Plan:  Pregnancy: G4P1021 at 350w3d.  Supervision of high risk pregnancy, antepartum FHT and FH normal  2. Gestational diabetes mellitus (GDM) controlled on oral hypoglycemic drug, antepartum BPP 10/10 yesterday. Fasting CBGs 95-97.  PP 130 after lunch Will increase metformin to '1000mg'$  in AM and '500mg'$  in PM Induce at 39 weeks - scheduled on 8/15.  3. GBS (group B Streptococcus carrier), +RV culture, currently pregnant Intrapartum PPx  4. Positive ANA (antinuclear antibody)   Preterm labor symptoms and general obstetric precautions including but not limited to vaginal bleeding, contractions, leaking of fluid and fetal movement were reviewed in detail with the patient. Please refer to After Visit Summary for other counseling recommendations.   No follow-ups on file.  Future Appointments  Date Time Provider [place]8/09/2021 10:15 AM [facility] NURSE [facility]/09/2021 10:30 AM [facility] US2 [facility]/10/2021 10:55 AM StTruett MainlandDO [facility] None  08/21/2021  7:00 AM MC-LD SCHED ROOM MC-INDC None  02/18/2022 10:40 AM Rice, ChResa MinerMD [facility] None    JaTruett MainlandDO

## 2021-08-12 ENCOUNTER — Other Ambulatory Visit: Payer: Self-pay | Admitting: Advanced Practice Midwife

## 2021-08-12 DIAGNOSIS — O24419 Gestational diabetes mellitus in pregnancy, unspecified control: Secondary | ICD-10-CM

## 2021-08-15 ENCOUNTER — Ambulatory Visit: Payer: BC Managed Care – PPO | Attending: Obstetrics and Gynecology

## 2021-08-15 ENCOUNTER — Encounter: Payer: Self-pay | Admitting: *Deleted

## 2021-08-15 ENCOUNTER — Ambulatory Visit: Payer: BC Managed Care – PPO | Admitting: *Deleted

## 2021-08-15 VITALS — BP 103/67 | HR 82

## 2021-08-15 DIAGNOSIS — E669 Obesity, unspecified: Secondary | ICD-10-CM

## 2021-08-15 DIAGNOSIS — O3413 Maternal care for benign tumor of corpus uteri, third trimester: Secondary | ICD-10-CM

## 2021-08-15 DIAGNOSIS — O0990 Supervision of high risk pregnancy, unspecified, unspecified trimester: Secondary | ICD-10-CM

## 2021-08-15 DIAGNOSIS — O99213 Obesity complicating pregnancy, third trimester: Secondary | ICD-10-CM | POA: Insufficient documentation

## 2021-08-15 DIAGNOSIS — D259 Leiomyoma of uterus, unspecified: Secondary | ICD-10-CM

## 2021-08-15 DIAGNOSIS — O24415 Gestational diabetes mellitus in pregnancy, controlled by oral hypoglycemic drugs: Secondary | ICD-10-CM | POA: Insufficient documentation

## 2021-08-15 DIAGNOSIS — Z3A38 38 weeks gestation of pregnancy: Secondary | ICD-10-CM

## 2021-08-15 DIAGNOSIS — R768 Other specified abnormal immunological findings in serum: Secondary | ICD-10-CM | POA: Insufficient documentation

## 2021-08-16 ENCOUNTER — Encounter: Payer: Self-pay | Admitting: Family Medicine

## 2021-08-16 ENCOUNTER — Ambulatory Visit (INDEPENDENT_AMBULATORY_CARE_PROVIDER_SITE_OTHER): Payer: BC Managed Care – PPO | Admitting: Family Medicine

## 2021-08-16 VITALS — BP 103/62 | HR 81 | Wt 242.0 lb

## 2021-08-16 DIAGNOSIS — O24415 Gestational diabetes mellitus in pregnancy, controlled by oral hypoglycemic drugs: Secondary | ICD-10-CM

## 2021-08-16 DIAGNOSIS — R7689 Other specified abnormal immunological findings in serum: Secondary | ICD-10-CM

## 2021-08-16 DIAGNOSIS — Z0289 Encounter for other administrative examinations: Secondary | ICD-10-CM

## 2021-08-16 DIAGNOSIS — R768 Other specified abnormal immunological findings in serum: Secondary | ICD-10-CM

## 2021-08-16 DIAGNOSIS — O99820 Streptococcus B carrier state complicating pregnancy: Secondary | ICD-10-CM

## 2021-08-16 DIAGNOSIS — O09893 Supervision of other high risk pregnancies, third trimester: Secondary | ICD-10-CM

## 2021-08-16 DIAGNOSIS — Z3A38 38 weeks gestation of pregnancy: Secondary | ICD-10-CM

## 2021-08-16 DIAGNOSIS — O0990 Supervision of high risk pregnancy, unspecified, unspecified trimester: Secondary | ICD-10-CM

## 2021-08-16 DIAGNOSIS — O24419 Gestational diabetes mellitus in pregnancy, unspecified control: Secondary | ICD-10-CM

## 2021-08-16 NOTE — Progress Notes (Signed)
   PRENATAL VISIT NOTE  Subjective:  Melanie Cruz is a 26 y.o. G4P1021 at 74w3dbeing seen today for ongoing prenatal care.  She is currently monitored for the following issues for this high-risk pregnancy and has Tobacco abuse counseling; Furunculosis of multiple sites; Avulsion fracture of tibial tuberosity; Migraine without aura and without status migrainosus, not intractable; Tension type headache; Prediabetes; PCOS (polycystic ovarian syndrome); Diarrhea; Chronic rhinitis; Supervision of high risk pregnancy, antepartum; Gestational diabetes mellitus (GDM), antepartum; Positive ANA (antinuclear antibody); Rash and other nonspecific skin eruption; Sedimentation rate elevation; Patellar tendinitis of left knee; Obesity in pregnancy, antepartum; and GBS (group B Streptococcus carrier), +RV culture, currently pregnant on their problem list.  Patient reports no complaints.  Contractions: Irritability. Vag. Bleeding: None.  Movement: Present. Denies leaking of fluid.   The following portions of the patient's history were reviewed and updated as appropriate: allergies, current medications, past family history, past medical history, past social history, past surgical history and problem list.   Objective:   Vitals:   08/16/21 1118  BP: 103/62  Pulse: 81  Weight: 242 lb (109.8 kg)    Fetal Status: Fetal Heart Rate (bpm): 143  Fundal Height: 40 cm Movement: Present  Presentation: Vertex  General:  Alert, oriented and cooperative. Patient is in no acute distress.  Skin: Skin is warm and dry. No rash noted.   Cardiovascular: Normal heart rate noted  Respiratory: Normal respiratory effort, no problems with respiration noted  Abdomen: Soft, gravid, appropriate for gestational age.  Pain/Pressure: Present     Pelvic: Cervical exam performed in the presence of a chaperone Dilation: Fingertip Effacement (%): Thick Station: -3  Extremities: Normal range of motion.  Edema: None  Mental Status: Normal  mood and affect. Normal behavior. Normal judgment and thought content.   Assessment and Plan:  Pregnancy: G4P1021 at 318w3d. Supervision of high risk pregnancy, antepartum  2. GDM, class A2 On metformin  Reports controlled CBGs. BPPs 8/8 EFW 49% Induction on 8/15.  3. GBS (group B Streptococcus carrier), +RV culture, currently pregnant Intrapartum Ppx  4. Positive ANA (antinuclear antibody)  Term labor symptoms and general obstetric precautions including but not limited to vaginal bleeding, contractions, leaking of fluid and fetal movement were reviewed in detail with the patient. Please refer to After Visit Summary for other counseling recommendations.   No follow-ups on file.  Future Appointments  Date Time Provider DeFort Ash[place]8/15/2023  7:00 AM MC-LD SCMcGrewC-INDC None  10/03/2021 11:15 AM StTruett MainlandDO [facility] None  02/18/2022 10:40 AM RiBenjamine MolaChResa MinerMD [facility] None    JaTruett MainlandDO

## 2021-08-16 NOTE — Progress Notes (Signed)
Pt presents for ROB forgot to bring CBG readings today. IOL scheduled 08/21/21

## 2021-08-18 ENCOUNTER — Encounter (HOSPITAL_COMMUNITY): Payer: Self-pay | Admitting: Obstetrics and Gynecology

## 2021-08-18 ENCOUNTER — Inpatient Hospital Stay (HOSPITAL_COMMUNITY)
Admission: AD | Admit: 2021-08-18 | Discharge: 2021-08-18 | Disposition: A | Discharge status: Home / Self Care | Payer: BC Managed Care – PPO | Attending: Obstetrics and Gynecology | Admitting: Obstetrics and Gynecology

## 2021-08-18 ENCOUNTER — Other Ambulatory Visit: Payer: Self-pay

## 2021-08-18 DIAGNOSIS — F17210 Nicotine dependence, cigarettes, uncomplicated: Secondary | ICD-10-CM | POA: Diagnosis not present

## 2021-08-18 DIAGNOSIS — O99323 Drug use complicating pregnancy, third trimester: Secondary | ICD-10-CM | POA: Diagnosis not present

## 2021-08-18 DIAGNOSIS — Z3689 Encounter for other specified antenatal screening: Secondary | ICD-10-CM

## 2021-08-18 DIAGNOSIS — O99333 Smoking (tobacco) complicating pregnancy, third trimester: Secondary | ICD-10-CM | POA: Insufficient documentation

## 2021-08-18 DIAGNOSIS — O479 False labor, unspecified: Secondary | ICD-10-CM

## 2021-08-18 DIAGNOSIS — N930 Postcoital and contact bleeding: Secondary | ICD-10-CM | POA: Diagnosis not present

## 2021-08-18 DIAGNOSIS — Z3A38 38 weeks gestation of pregnancy: Secondary | ICD-10-CM | POA: Diagnosis not present

## 2021-08-18 NOTE — MAU Provider Note (Addendum)
History     CSN: [id number]  Arrival date and time: 08/18/21 2041  Event Date/Time  First Provider Initiated Contact with Patient 08/18/21 2119      Chief Complaint  Patient presents with   Vaginal Discharge   HPI Melanie Cruz is a 26 y.o. [id number] at 11w5dwho presents to MAU with chief complaint of vaginal discharge. This occurred x 1 episode during the 3 o clock hour today. Discharge was "a single stripe" and brown. She has not visualized discharge since that time. She denies frank red bleeding. She is not wearing a pad or pantyliner on arrival to MAU. Patient reports sexual intercourse yesterday.  Patient reports "a run of contractions" just before she arrived to MAU. She denies contractions during MAU encounter. She denies DFM, LOF, abdominal tenderness.   Patient receives care with [facility]. She is scheduled for IOL on Tuesday 08/21/2021.   OB History     Gravida  4   Para  1   Term  1   Preterm      AB  2   Living  1      SAB      IAB  2   Ectopic      Multiple  0   Live Births  1           Past Medical History:  Diagnosis Date   COVID-19    Migraine     Past Surgical History:  Procedure Laterality Date   NO PAST SURGERIES      Family History  Problem Relation Age of Onset   Hypertension Mother    Diabetes Mother    Breast cancer Paternal Aunt        age at onset 359's  Migraines Neg Hx     Social History   Tobacco Use   Smoking status: Every Day    Packs/day: 0.10    Years: 5.00    Total pack years: 0.50    Types: Cigarettes   Smokeless tobacco: Never   Tobacco comments:    2 cig daily  Vaping Use   Vaping Use: Former   Substances: Flavoring  Substance Use Topics   Alcohol use: Not Currently   Drug use: Not Currently    Types: Marijuana    Comment: occasional-last use 05/23/2017    Allergies:  Allergies  Allergen Reactions   Azithromycin Anaphylaxis   Cefdinir Other (See Comments)    Had sensation in throat  after taking, was getting over a cold at the time.    Medications Prior to Admission  Medication Sig Dispense Refill Last Dose   acetaminophen (TYLENOL) 325 MG tablet Take 650 mg by mouth every 6 (six) hours as needed.   Past Week   aspirin EC 81 MG tablet Take 1 tablet (81 mg total) by mouth daily. Take after 12 weeks for prevention of preeclampsia later in pregnancy 300 tablet 2 Past Week   Continuous Blood Gluc Sensor (DEXCOM G6 SENSOR) MISC 1 Units by Does not apply route as directed. Change every 10 days. 9 each 3 08/18/2021   Continuous Blood Gluc Transmit (DEXCOM G6 TRANSMITTER) MISC 1 Units by Does not apply route as directed. Change every 90 days 1 each 3 08/18/2021   cyclobenzaprine (FLEXERIL) 10 MG tablet Take 0.5-1 tablets (5-10 mg total) by mouth 3 (three) times daily as needed for muscle spasms. 30 tablet 2 Past Month   fluticasone (FLONASE) 50 MCG/ACT nasal spray Place into the nose as needed.  Past Month   Magnesium 250 MG TABS Take by mouth.   08/18/2021   metFORMIN (GLUCOPHAGE) 500 MG tablet TAKE 1 TABLET BY MOUTH TWICE A DAY 180 tablet 3 08/18/2021   Prenatal Vit-Fe Fumarate-FA (PRENATAL VITAMINS PO) Take by mouth.   08/18/2021   busPIRone (BUSPAR) 5 MG tablet Take 1 tablet (5 mg total) by mouth 2 (two) times daily. 60 tablet 3    clindamycin (CLINDAGEL) 1 % gel Apply topically 2 (two) times daily. (Patient not taking: Reported on 08/16/2021) 30 g 11    triamcinolone (KENALOG) 0.025 % ointment Apply 1 application. topically 2 (two) times daily. 30 g 0 More than a month    Review of Systems  Genitourinary:  Positive for vaginal discharge.  All other systems reviewed and are negative.  Physical Exam   Blood pressure 133/78, pulse (!) 104, temperature 98.4 F (36.9 C), temperature source Oral, resp. rate 16, height '5\' 5"'$  (1.651 m), weight 109.8 kg, last menstrual period 11/14/2020, SpO2 97 %.  Physical Exam Vitals and nursing note reviewed. Exam conducted with a chaperone  present.  Constitutional:      General: She is not in acute distress.    Appearance: Normal appearance. She is not ill-appearing.  Cardiovascular:     Rate and Rhythm: Normal rate.     Pulses: Normal pulses.  Pulmonary:     Effort: Pulmonary effort is normal.  Abdominal:     Palpations: Abdomen is soft.     Tenderness: There is no abdominal tenderness.     Comments: Gravid  Skin:    Capillary Refill: Capillary refill takes less than 2 seconds.  Neurological:     Mental Status: She is alert and oriented to person, place, and time.  Psychiatric:        Mood and Affect: Mood normal.        Behavior: Behavior normal.        Thought Content: Thought content normal.        Judgment: Judgment normal.     MAU Course  Procedures  MDM --Pertinent negatives include: NR NST, abdominal tenderness, frank red vaginal bleeding --Reactive tracing x 1 hour: baseline 150, mod var, + accels, no decels --Toco: irregular contractions q 6-8 min --No blood on glove worn for cervical exams  Patient Vitals for the past 24 hrs:  BP Temp Temp src Pulse Resp SpO2 Height Weight  08/18/21 2158 137/70 98.2 F (36.8 C) Oral 79 18 -- -- --  08/18/21 2100 133/78 -- -- -- -- -- '5\' 5"'$  (1.651 m) 109.8 kg  08/18/21 2053 -- 98.4 F (36.9 C) Oral (!) 104 16 97 % -- --    Assessment and Plan  --26 y.o. [id number] at [redacted]w[redacted]d--Reactive tracing --Postcoital spotting x "1 stripe" --Not laboring --Posterior placenta --Discharge home in stable condition with return precautions  F/U: --IOL in 3 days  SDarlina Rumpf MSA, MSN, CNM 08/18/2021, 11:03 PM

## 2021-08-18 NOTE — MAU Note (Signed)
..  Melanie Cruz is a 26 y.o. at 71w5dhere in MAU reporting: brown discharge on a liner around 1500. +FM.   Onset of complaint: 08/18/2021 Pain score: 0/10 Vitals:   08/18/21 2053 08/18/21 2100  BP:  133/78  Pulse: (!) 104   Resp: 16   Temp: 98.4 F (36.9 C)   SpO2: 97%      FHT:151 Lab orders placed from triage: UA

## 2021-08-20 DIAGNOSIS — Z3A39 39 weeks gestation of pregnancy: Secondary | ICD-10-CM | POA: Diagnosis not present

## 2021-08-20 DIAGNOSIS — O24420 Gestational diabetes mellitus in childbirth, diet controlled: Secondary | ICD-10-CM | POA: Diagnosis not present

## 2021-08-20 DIAGNOSIS — O24425 Gestational diabetes mellitus in childbirth, controlled by oral hypoglycemic drugs: Secondary | ICD-10-CM | POA: Diagnosis not present

## 2021-08-21 ENCOUNTER — Inpatient Hospital Stay (HOSPITAL_COMMUNITY): Payer: BC Managed Care – PPO

## 2021-08-21 ENCOUNTER — Inpatient Hospital Stay (HOSPITAL_COMMUNITY): Admission: AD | Admit: 2021-08-21 | Payer: BC Managed Care – PPO | Source: Home / Self Care | Admitting: Family Medicine

## 2021-08-23 ENCOUNTER — Encounter: Payer: BC Managed Care – PPO | Admitting: Family Medicine

## 2021-09-11 ENCOUNTER — Other Ambulatory Visit: Payer: Self-pay | Admitting: Family

## 2021-09-11 ENCOUNTER — Encounter: Payer: Self-pay | Admitting: Family

## 2021-09-11 ENCOUNTER — Ambulatory Visit (INDEPENDENT_AMBULATORY_CARE_PROVIDER_SITE_OTHER): Payer: Medicaid Other | Admitting: Family

## 2021-09-11 VITALS — BP 110/60 | HR 98 | Temp 98.5°F | Ht 65.0 in | Wt 216.2 lb

## 2021-09-11 DIAGNOSIS — J309 Allergic rhinitis, unspecified: Secondary | ICD-10-CM

## 2021-09-11 DIAGNOSIS — H9202 Otalgia, left ear: Secondary | ICD-10-CM | POA: Diagnosis not present

## 2021-09-11 MED ORDER — FLUTICASONE PROPIONATE 50 MCG/ACT NA SUSP
2.0000 | Freq: Every day | NASAL | 1 refills | Status: DC | PRN
Start: 2021-09-11 — End: 2021-10-01

## 2021-09-11 NOTE — Progress Notes (Signed)
Melanie Cruz is a 26 y.o. female with the following history as recorded in EpicCare:  Patient Active Problem List   Diagnosis Date Noted   GBS (group B Streptococcus carrier), +RV culture, currently pregnant 08/06/2021   Obesity in pregnancy, antepartum 07/19/2021   Patellar tendinitis of left knee 04/04/2021   Positive ANA (antinuclear antibody) 03/09/2021   Rash and other nonspecific skin eruption 03/09/2021   Sedimentation rate elevation 03/09/2021   Gestational diabetes mellitus (GDM), antepartum 02/21/2021   Supervision of high risk pregnancy, antepartum 01/25/2021   Diarrhea 11/07/2020   PCOS (polycystic ovarian syndrome) 09/29/2020   Prediabetes 04/06/2020   Migraine without aura and without status migrainosus, not intractable 03/06/2020   Tension type headache 03/06/2020   Avulsion fracture of tibial tuberosity 02/25/2020   Chronic rhinitis 03/25/2019   Furunculosis of multiple sites 04/15/2017   Tobacco abuse counseling 03/13/2017    Current Outpatient Medications  Medication Sig Dispense Refill   acetaminophen (TYLENOL) 325 MG tablet Take 650 mg by mouth every 6 (six) hours as needed.     busPIRone (BUSPAR) 5 MG tablet Take 1 tablet (5 mg total) by mouth 2 (two) times daily. 60 tablet 3   Magnesium 250 MG TABS Take by mouth.     Prenatal Vit-Fe Fumarate-FA (PRENATAL VITAMINS PO) Take by mouth.     triamcinolone (KENALOG) 0.025 % ointment Apply 1 application. topically 2 (two) times daily. 30 g 0   Continuous Blood Gluc Sensor (DEXCOM G6 SENSOR) MISC 1 Units by Does not apply route as directed. Change every 10 days. (Patient not taking: Reported on 09/11/2021) 9 each 3   Continuous Blood Gluc Transmit (DEXCOM G6 TRANSMITTER) MISC 1 Units by Does not apply route as directed. Change every 90 days (Patient not taking: Reported on 09/11/2021) 1 each 3   cyclobenzaprine (FLEXERIL) 10 MG tablet Take 0.5-1 tablets (5-10 mg total) by mouth 3 (three) times daily as needed for muscle  spasms. (Patient not taking: Reported on 09/11/2021) 30 tablet 2   fluticasone (FLONASE) 50 MCG/ACT nasal spray Place 2 sprays into both nostrils daily as needed. 16 g 1   metFORMIN (GLUCOPHAGE) 500 MG tablet TAKE 1 TABLET BY MOUTH TWICE A DAY (Patient not taking: Reported on 09/11/2021) 180 tablet 3   No current facility-administered medications for this visit.    Allergies: Azithromycin and Cefdinir  Past Medical History:  Diagnosis Date   COVID-19    Migraine     Past Surgical History:  Procedure Laterality Date   NO PAST SURGERIES      Family History  Problem Relation Age of Onset   Hypertension Mother    Diabetes Mother    Breast cancer Paternal Aunt        age at onset 64's   Migraines Neg Hx     Social History   Tobacco Use   Smoking status: Every Day    Packs/day: 0.10    Years: 5.00    Total pack years: 0.50    Types: Cigarettes   Smokeless tobacco: Never   Tobacco comments:    2 cig daily  Substance Use Topics   Alcohol use: Not Currently    Subjective:   3 weeks post-partum; + breast-feeding;  Sore on inside of left outer ear x 2 days; no fever; does feel that ear is clogged;   Objective:  Vitals:   09/11/21 1546  BP: 110/60  Pulse: 98  Temp: 98.5 F (36.9 C)  TempSrc: Oral  SpO2: 98%  Weight: 216 lb 3.2 oz (98.1 kg)  Height: '5\' 5"'$  (1.651 m)    General: Well developed, well nourished, in no acute distress  Skin : Warm and dry.  Head: Normocephalic and atraumatic  Eyes: Sclera and conjunctiva clear; pupils round and reactive to light; extraocular movements intact  Ears: External normal-mild swelling noted over outer ear lobe; canals clear; tympanic membranes normal  Oropharynx: Pink, supple. No suspicious lesions  Neck: Supple without thyromegaly, adenopathy  Lungs: Respirations unlabored;  Neurologic: Alert and oriented; speech intact; face symmetrical; moves all extremities well; CNII-XII intact without focal deficit   Assessment:  1. Ear  pain, left   2. Allergic rhinitis, unspecified seasonality, unspecified trigger     Plan:  Reassurance; recommend to apply warm compresses to area; continue Tylenol; follow up worse, no better; Refill updated on Flonase;   No follow-ups on file.  No orders of the defined types were placed in this encounter.   Requested Prescriptions   Signed Prescriptions Disp Refills   fluticasone (FLONASE) 50 MCG/ACT nasal spray 16 g 1    Sig: Place 2 sprays into both nostrils daily as needed.

## 2021-10-01 ENCOUNTER — Ambulatory Visit (INDEPENDENT_AMBULATORY_CARE_PROVIDER_SITE_OTHER): Payer: BC Managed Care – PPO | Admitting: Obstetrics & Gynecology

## 2021-10-01 ENCOUNTER — Encounter: Payer: Self-pay | Admitting: Obstetrics & Gynecology

## 2021-10-01 DIAGNOSIS — Z8632 Personal history of gestational diabetes: Secondary | ICD-10-CM | POA: Diagnosis not present

## 2021-10-01 NOTE — Progress Notes (Signed)
Medication Samples have been provided to the patient.  Drug name: Slynd       Strength: 4 mg tabs       Qty: 2 boxes  LOT: [id number]  Exp.Date: 02/2022  Dosing instructions: take one daily.  The patient has been instructed regarding the correct time, dose, and frequency of taking this medication, including desired effects and most common side effects.   Kathrene Alu 8:56 AM 10/01/2021

## 2021-10-01 NOTE — Progress Notes (Signed)
[place] Partum Visit Note  Melanie Cruz is a 26 y.o. G51P1021 female who presents for a postpartum visit. She is 5 weeks postpartum following a normal spontaneous vaginal delivery.  I have fully reviewed the prenatal and intrapartum course, patient had medication controlled GDM. The delivery was at 39.6 gestational weeks.  Anesthesia: epidural. Postpartum course has been uncomplicated. Baby is doing well. Baby is feeding by breast and  Marcos Eke . Bleeding no bleeding. Bowel function is normal. Bladder function is normal. Patient is sexually active. Postpartum depression screening: negative.  The pregnancy intention screening data noted above was reviewed. Potential methods of contraception were discussed. The patient elected to proceed with pills   Edinburgh Postnatal Depression Scale - 10/01/21 0833       Edinburgh Postnatal Depression Scale:  In the Past 7 Days   I have been able to laugh and see the funny side of things. 0    I have looked forward with enjoyment to things. 1    I have blamed myself unnecessarily when things went wrong. 1    I have been anxious or worried for no good reason. 0    I have felt scared or panicky for no good reason. 1    Things have been getting on top of me. 1    I have been so unhappy that I have had difficulty sleeping. 1    I have felt sad or miserable. 0    I have been so unhappy that I have been crying. 1    The thought of harming myself has occurred to me. 0    Edinburgh Postnatal Depression Scale Total 6             Health Maintenance Due  Topic Date Due   COVID-19 Vaccine (1) Never done   INFLUENZA VACCINE  Never done    The following portions of the patient's history were reviewed and updated as appropriate: allergies, current medications, past family history, past medical history, past social history, past surgical history, and problem list.  Review of Systems Pertinent items noted in HPI and remainder of comprehensive ROS  otherwise negative.  Objective:  BP 108/68   Pulse 61   Wt 216 lb (98 kg)   LMP 11/14/2020 (Exact Date)   BMI 35.94 kg/m    General:  alert and no distress   Breasts:  normal (done in presence of RN as chaperone)  Lungs: clear to auscultation bilaterally  Heart:  regular rate and rhythm  Abdomen: soft, non-tender; bowel sounds normal; no masses,  no organomegaly   GU exam:  not indicated       Assessment:   Normal postpartum exam.  History of A2GDM.  Plan:   Essential components of care per ACOG recommendations:  1.  Mood and well being: Patient with negative depression screening today. Reviewed local resources for support.  - Patient tobacco use? No.   - hx of drug use? No.    2. Infant care and feeding:  -Patient currently breastmilk feeding? Yes. Reviewed importance of draining breast regularly to support lactation.  -Social determinants of health (SDOH) reviewed in EPIC. No concerns.  3. Sexuality, contraception and birth spacing - Patient does not want a pregnancy in the next year.  Desired family size is 2 children.  - Reviewed reproductive life planning. Reviewed contraceptive methods based on pt preferences and effectiveness.  Patient desired Oral Contraceptive today.  Slynd samples (2) given to her, patient reports history of migraines  with estrogen containing pills, also she is breastfeeding.  Patient advised to use reliable back up contraception for next two weeks, condoms were provided for patient.  4. Sleep and fatigue -Encouraged family/partner/community support of 4 hrs of uninterrupted sleep to help with mood and fatigue  5. Physical Recovery  - Discussed patients delivery and complications. She describes her labor as good. - Patient had a vaginal delivery, no problems at delivery. Patient had  no  laceration.  - Patient has urinary incontinence? No. - Patient is safe to resume physical and sexual activity  6.  Health Maintenance - HM due items  addressed Yes - Last pap smear  Diagnosis  Date Value Ref Range Status  11/24/2019   Final   - Negative for intraepithelial lesion or malignancy (NILM)   Pap smear not done at today's visit.   7. Pregnancy Condition follow up: Gestational Diabetes - 2 hr GTT today, will follow up results and manage accordingly. - PCP follow up     Verita Schneiders, MD [facility] [place]

## 2021-10-02 ENCOUNTER — Telehealth: Payer: Self-pay

## 2021-10-02 LAB — GLUCOSE TOLERANCE, 2 HOURS
Glucose, 2 hour: 131 mg/dL (ref 70–139)
Glucose, GTT - Fasting: 179 mg/dL — ABNORMAL HIGH (ref 70–99)

## 2021-10-02 NOTE — Telephone Encounter (Signed)
-----   Message from Osborne Oman, MD sent at 10/02/2021  5:48 AM EDT ----- Elevated fasting level is concerning for Type 2 DM.  Recommend that she come back to office and check Hgb A1C for confirmation.  Can also follow up with PCP for further evaluation.

## 2021-10-02 NOTE — Telephone Encounter (Signed)
Called patient and made her aware that her Fasting Glucose was elevated. Patient made aware that this is concerning for Type 2 DM. Patient is scheduled to come in for Hgb A1C lab on 10/04/21. Understanding was voiced. Damarion Mendizabal l Shawntae Lowy, CMA

## 2021-10-03 ENCOUNTER — Ambulatory Visit: Payer: BC Managed Care – PPO | Admitting: Family Medicine

## 2021-10-03 ENCOUNTER — Ambulatory Visit: Payer: Medicaid Other | Admitting: *Deleted

## 2021-10-03 DIAGNOSIS — Z8632 Personal history of gestational diabetes: Secondary | ICD-10-CM

## 2021-10-03 NOTE — Progress Notes (Signed)
Pt here for Hgb A1C only

## 2021-10-04 ENCOUNTER — Encounter: Payer: Self-pay | Admitting: Medical

## 2021-10-04 LAB — HEMOGLOBIN A1C
Est. average glucose Bld gHb Est-mCnc: 131 mg/dL
Hgb A1c MFr Bld: 6.2 % — ABNORMAL HIGH (ref 4.8–5.6)

## 2021-10-10 ENCOUNTER — Ambulatory Visit (INDEPENDENT_AMBULATORY_CARE_PROVIDER_SITE_OTHER): Payer: BC Managed Care – PPO | Admitting: Medical

## 2021-10-10 VITALS — BP 114/70 | HR 79 | Temp 98.0°F | Resp 18 | Ht 65.0 in | Wt 215.0 lb

## 2021-10-10 DIAGNOSIS — K0889 Other specified disorders of teeth and supporting structures: Secondary | ICD-10-CM

## 2021-10-10 DIAGNOSIS — R7303 Prediabetes: Secondary | ICD-10-CM | POA: Diagnosis not present

## 2021-10-10 DIAGNOSIS — R739 Hyperglycemia, unspecified: Secondary | ICD-10-CM | POA: Diagnosis not present

## 2021-10-10 MED ORDER — METFORMIN HCL 500 MG PO TABS: 500.0000 mg | ORAL_TABLET | Freq: Two times a day (BID) | ORAL | 3 refills | Status: DC

## 2021-10-10 MED ORDER — AMOXICILLIN-POT CLAVULANATE 875-125 MG PO TABS: 1.0000 | ORAL_TABLET | Freq: Two times a day (BID) | ORAL | 0 refills | Status: DC

## 2021-10-10 NOTE — Progress Notes (Signed)
Subjective:    Patient ID: Melanie Cruz, female    DOB: Jun 14, 1995, 26 y.o.   MRN: [id number]  HPI  Pt in for follow up on prediabetes.  Pt states had gestational diabetes.  Pt glucose tolerance test came back at 179 at gyn office. Pt a1c 2 days ago came back at 6.2. Pt had been on metformin during her pregnancy and no adverse side effects.  Pt had family history of diabetes both mom and grandmom diabetics.   Pt has glucometer at home.   Pt also mentions some left side foreahead pain eating cold hard piece of pizza. Happens once on saturday and Tuesday. One left upper tooth crown mild tender.  Pt in past used augmentin rx'd by ENT in past last year.     Review of Systems  Constitutional:  Negative for chills, diaphoresis and fatigue.  HENT:         Tooth pain and some forehead pain when eating.  Respiratory:  Negative for cough, chest tightness, shortness of breath and wheezing.   Cardiovascular:  Negative for chest pain and palpitations.  Gastrointestinal:  Negative for abdominal pain.  Genitourinary:  Negative for dyspareunia and dysuria.  Musculoskeletal:  Negative for back pain.  Skin:  Negative for rash.  Neurological:  Negative for facial asymmetry.  Hematological:  Negative for adenopathy. Does not bruise/bleed easily.    Past Medical History:  Diagnosis Date   COVID-19    Gestational diabetes mellitus (GDM), antepartum 02/21/2021   Migraine      Social History   Socioeconomic History   Marital status: Single    Spouse name: Not on file   Number of children: Not on file   Years of education: Not on file   Highest education level: Not on file  Occupational History   Not on file  Tobacco Use   Smoking status: Every Day    Packs/day: 0.10    Years: 5.00    Total pack years: 0.50    Types: Cigarettes   Smokeless tobacco: Never   Tobacco comments:    2 cig daily  Vaping Use   Vaping Use: Former   Substances: Flavoring  Substance and Sexual Activity    Alcohol use: Not Currently   Drug use: Not Currently    Types: Marijuana    Comment: occasional-last use 05/23/2017   Sexual activity: Yes    Birth control/protection: Inserts    Comment: nuvaring   Other Topics Concern   Not on file  Social History Narrative   Lives with roommate   Right handed   Caffeine: 0-1 cup/day   Social Determinants of Health   Financial Resource Strain: Not on file  Food Insecurity: No Food Insecurity (07/13/2021)   Hunger Vital Sign    Worried About Running Out of Food in the Last Year: Never true    Ran Out of Food in the Last Year: Never true  Transportation Needs: No Transportation Needs (07/13/2021)   PRAPARE - Hydrologist (Medical): No    Lack of Transportation (Non-Medical): No  Physical Activity: Not on file  Stress: Not on file  Social Connections: Not on file  Intimate Partner Violence: Not on file    Past Surgical History:  Procedure Laterality Date   NO PAST SURGERIES      Family History  Problem Relation Age of Onset   Hypertension Mother    Diabetes Mother    Breast cancer Paternal Aunt  age at onset 53's   Migraines Neg Hx     Allergies  Allergen Reactions   Azithromycin Anaphylaxis   Cefdinir Other (See Comments)    Had sensation in throat after taking, was getting over a cold at the time.    No current outpatient medications on file prior to visit.   No current facility-administered medications on file prior to visit.    BP 114/70   Pulse 79   Temp 98 F (36.7 C)   Resp 18   Ht '5\' 5"'$  (1.651 m)   Wt 215 lb (97.5 kg)   SpO2 100%   BMI 35.78 kg/m        Objective:   Physical Exam  General- No acute distress. Pleasant patient. Neck- Full range of motion, no jvd Lungs- Clear, even and unlabored. Heart- regular rate and rhythm. Neurologic- CNII- XII grossly intact.  HEENT- LEFT UPPER TOOTH/CROWN MILD TENDER.  NO LYMPHADENOPATHY. NO SINUS PRESSURE. LEFT EAR CANAL MILD  WAX PRESENT. NO OBVOIUS SWELLING OF LEFT EAR LOBE.      Assessment & Plan:   Patient Instructions  Prediabetes with a1c 6.2. Recommend low sugar diet, exercise and metformin.  Repeat a1c in 3 months.   For left tooth pain with left forehead region pain on eating will rx augmentin. If pain worsens go ahead and start. Also please make appointment with dentist.   Follow up 3 months or sooner if needed.   Mackie Pai, PA-C

## 2021-10-10 NOTE — Patient Instructions (Signed)
Prediabetes with a1c 6.2. Recommend low sugar diet, exercise and metformin.  Repeat a1c in 3 months.   For left tooth pain with left forehead region pain on eating will rx augmentin. If pain worsens go ahead and start. Also please make appointment with dentist.   Follow up 3 months or sooner if needed.

## 2021-11-09 ENCOUNTER — Ambulatory Visit (INDEPENDENT_AMBULATORY_CARE_PROVIDER_SITE_OTHER): Payer: BC Managed Care – PPO

## 2021-11-09 ENCOUNTER — Other Ambulatory Visit (HOSPITAL_COMMUNITY)
Admission: RE | Admit: 2021-11-09 | Discharge: 2021-11-09 | Disposition: A | Discharge status: Home / Self Care | Payer: BC Managed Care – PPO | Source: Ambulatory Visit | Attending: Obstetrics and Gynecology | Admitting: Obstetrics and Gynecology

## 2021-11-09 VITALS — BP 108/66 | HR 83 | Wt 215.0 lb

## 2021-11-09 DIAGNOSIS — Z113 Encounter for screening for infections with a predominantly sexual mode of transmission: Secondary | ICD-10-CM

## 2021-11-09 DIAGNOSIS — N898 Other specified noninflammatory disorders of vagina: Secondary | ICD-10-CM

## 2021-11-09 DIAGNOSIS — R300 Dysuria: Secondary | ICD-10-CM

## 2021-11-09 NOTE — Progress Notes (Signed)
Patient presents for STD testing. Patient states she has some discharge and burning when she uses the restroom. Patient denies any new partners. Patient request vaginal cultures and blood drawn for STD testing be done.Kathrene Alu RN

## 2021-11-10 LAB — HEPATITIS B SURFACE ANTIGEN: Hepatitis B Surface Ag: NEGATIVE

## 2021-11-10 LAB — RPR: RPR Ser Ql: NONREACTIVE

## 2021-11-10 LAB — HEPATITIS C ANTIBODY: Hep C Virus Ab: NONREACTIVE

## 2021-11-10 LAB — HIV ANTIBODY (ROUTINE TESTING W REFLEX): HIV Screen 4th Generation wRfx: NONREACTIVE

## 2021-11-12 LAB — CERVICOVAGINAL ANCILLARY ONLY
Bacterial Vaginitis (gardnerella): POSITIVE — AB
Candida Glabrata: NEGATIVE
Candida Vaginitis: POSITIVE — AB
Chlamydia: NEGATIVE
Comment: NEGATIVE
Comment: NEGATIVE
Comment: NEGATIVE
Comment: NEGATIVE
Comment: NEGATIVE
Comment: NORMAL
Neisseria Gonorrhea: NEGATIVE
Trichomonas: NEGATIVE

## 2021-11-12 LAB — URINE CULTURE

## 2021-11-13 ENCOUNTER — Other Ambulatory Visit: Payer: Self-pay

## 2021-11-13 DIAGNOSIS — B379 Candidiasis, unspecified: Secondary | ICD-10-CM

## 2021-11-13 DIAGNOSIS — B9689 Other specified bacterial agents as the cause of diseases classified elsewhere: Secondary | ICD-10-CM

## 2021-11-13 MED ORDER — FLUCONAZOLE 150 MG PO TABS: ORAL_TABLET | ORAL | 1 refills | Status: DC

## 2021-11-13 MED ORDER — FLUCONAZOLE 150 MG PO TABS: ORAL_TABLET | ORAL | 0 refills | Status: DC

## 2021-11-13 MED ORDER — METRONIDAZOLE 500 MG PO TABS: 500.0000 mg | ORAL_TABLET | Freq: Two times a day (BID) | ORAL | 0 refills | Status: DC

## 2021-11-13 NOTE — Progress Notes (Signed)
Called pt. Left message for patient to call the office back.Patient tested positive for BV and yeast. Diflucan 150 mg 1 tablet PO once was sent in for yeast and Flagyl 500 mg 1 tablet BID x 7 days was sent to her pharmacy. A Mychart message will be sent to patient. Saim Almanza l Haeli Gerlich, CMA

## 2021-11-26 ENCOUNTER — Telehealth: Payer: Self-pay | Admitting: *Deleted

## 2021-11-26 NOTE — Telephone Encounter (Signed)
Who Is Calling Patient / Member / Family / Caregiver Call Type Triage / Clinical Relationship To Patient Self Return Phone Number [phone] (Primary) Chief Complaint Joint Swelling Reason for Call Symptomatic / Request for Health Information Initial Comment caller states 3 of her fingers are swollen. Hands and feet started itching. They hurt and swelling. Translation No No Triage Reason Other Nurse Assessment Nurse: Marcello Moores, RN, Cheri Date/Time Eilene Ghazi Time): 11/23/2021 11:15:10 PM Confirm and document reason for call. If symptomatic, describe symptoms. ---Caller states she has three swollen fingers and one swollen toe with itching to her hands and feet. States she took diflucan and within 20-30 minutes her symptoms started. States she is currently at the ER being seen. Does the patient have any new or worsening symptoms? ---Yes Will a triage be completed? ---No Select reason for no triage. ---Other Please document clinical information provided and list any resource used. ---Patient is currently being seen at the ER. Recommended she call back for any new or worsening symptoms after she is seen.

## 2021-11-26 NOTE — Telephone Encounter (Signed)
Patient went to ER>

## 2021-11-28 ENCOUNTER — Encounter: Payer: Self-pay | Admitting: Medical

## 2021-12-03 ENCOUNTER — Ambulatory Visit (INDEPENDENT_AMBULATORY_CARE_PROVIDER_SITE_OTHER): Payer: Medicaid Other | Admitting: Medical

## 2021-12-03 ENCOUNTER — Encounter: Payer: Self-pay | Admitting: Medical

## 2021-12-03 VITALS — BP 120/70 | HR 96 | Temp 98.0°F | Resp 18 | Ht 65.0 in | Wt 218.0 lb

## 2021-12-03 DIAGNOSIS — T148XXA Other injury of unspecified body region, initial encounter: Secondary | ICD-10-CM | POA: Diagnosis not present

## 2021-12-03 DIAGNOSIS — R1030 Lower abdominal pain, unspecified: Secondary | ICD-10-CM | POA: Diagnosis not present

## 2021-12-03 DIAGNOSIS — S61209A Unspecified open wound of unspecified finger without damage to nail, initial encounter: Secondary | ICD-10-CM

## 2021-12-03 LAB — CBC WITH DIFFERENTIAL/PLATELET
Basophils Absolute: 0.1 10*3/uL (ref 0.0–0.1)
Basophils Relative: 0.9 % (ref 0.0–3.0)
Eosinophils Absolute: 0.1 10*3/uL (ref 0.0–0.7)
Eosinophils Relative: 1.6 % (ref 0.0–5.0)
HCT: 37.8 % (ref 36.0–46.0)
Hemoglobin: 12 g/dL (ref 12.0–15.0)
Lymphocytes Relative: 36.4 % (ref 12.0–46.0)
Lymphs Abs: 2.5 10*3/uL (ref 0.7–4.0)
MCHC: 31.8 g/dL (ref 30.0–36.0)
MCV: 80.5 fl (ref 78.0–100.0)
Monocytes Absolute: 0.5 10*3/uL (ref 0.1–1.0)
Monocytes Relative: 7.1 % (ref 3.0–12.0)
Neutro Abs: 3.7 10*3/uL (ref 1.4–7.7)
Neutrophils Relative %: 54 % (ref 43.0–77.0)
Platelets: 208 10*3/uL (ref 150.0–400.0)
RBC: 4.69 Mil/uL (ref 3.87–5.11)
RDW: 16.9 % — ABNORMAL HIGH (ref 11.5–15.5)
WBC: 6.9 10*3/uL (ref 4.0–10.5)

## 2021-12-03 LAB — COMPREHENSIVE METABOLIC PANEL
ALT: 20 U/L (ref 0–35)
AST: 16 U/L (ref 0–37)
Albumin: 4.1 g/dL (ref 3.5–5.2)
Alkaline Phosphatase: 40 U/L (ref 39–117)
BUN: 11 mg/dL (ref 6–23)
CO2: 26 mEq/L (ref 19–32)
Calcium: 8.6 mg/dL (ref 8.4–10.5)
Chloride: 101 mEq/L (ref 96–112)
Creatinine, Ser: 0.66 mg/dL (ref 0.40–1.20)
GFR: 119.08 mL/min (ref >60.00)
Glucose, Bld: 95 mg/dL (ref 70–99)
Potassium: 3.5 mEq/L (ref 3.5–5.1)
Sodium: 136 mEq/L (ref 135–145)
Total Bilirubin: 0.3 mg/dL (ref 0.2–1.2)
Total Protein: 6.9 g/dL (ref 6.0–8.3)

## 2021-12-03 LAB — LIPASE: Lipase: 27 U/L (ref 11.0–59.0)

## 2021-12-03 MED ORDER — MUPIROCIN 2 % EX OINT: 1.0000 | TOPICAL_OINTMENT | Freq: Two times a day (BID) | CUTANEOUS | 0 refills | Status: DC

## 2021-12-03 NOTE — Patient Instructions (Addendum)
Recent blister eruption after use of diflucan. Given steroid in ED and then used benadryl. Probable allergic reaction. If any new blister eruption occur in future will refer to dermatologist.  For the time being not to use diflucan.  Presently getting wound culture from area base of rt 5th digit to see if bacteria grows out. Pending that result use mupirocin topical ointment twice daily.  Intermittent low-level transient right-sided abdomen pain.  Negative associated features on review.  Also not associated with eating.  Explained would get CBC, CMP and lipase level.  Will update patient on lab results and asked her to update our office on any changing or worsening signs or symptoms.  Changes signs and symptoms might direct next diagnostic step.  Particularly might consider imaging studies.  Patient expressed understanding.  Follow up in 2 weeks or sooner if needed.

## 2021-12-03 NOTE — Progress Notes (Signed)
Subjective:    Patient ID: Melanie Cruz, female    DOB: 24-Jul-1995, 26 y.o.   MRN: [id number]  HPI Pt states hx of some recent new onset blisters to rt second digit pip join,  rt 5th digit mcp join and base of thumb.(All on dorsal aspect).  Also recent blister at base of 4th toe.   This areas erupted on 11-23-2021.   These area did itch originally. Went to ED and they gave steroid in the ED and benadryl.   Pt has seen rheumatologist in past for +ana. Told did not have lupus but did need follow up. That appt for follow up in in [place].    "Medical Decision Making 26 year old female presents to ED for evaluation of rash starting earlier this evening. Patient reports she started taking Diflucan. Notes that she began to have itching to the hands and feet. Patient does have wheals present on evaluation. At this time, patient is not endorsing any shortness of breath, drooling or dyspnea. Discussed with patient that we will discontinue her Diflucan and start her on a course of clotrimazole. Patient is also advised that we will give her a dose of steroids and Benadryl here. She is encouraged to continue use of Benadryl at home. Advised to follow-up closely with her primary care provider and return to the ED for any new or worsening symptoms including any difficulties with breathing or swallowing. Patient is agreeable with plan of care.  Allergic reaction, initial encounter: acute illness or injury Amount and/or Complexity of Data Reviewed External Data Reviewed: notes. Details: Reviewed Department of Health records from 11/09/2021 for management of STD screening  Risk OTC drugs. Prescription drug management.  ED Clinical Impression   1. Allergic reaction, initial encounter    Medication List   START taking these medications  clotrimazole 1 % vaginal cream Commonly known as: LOTRIMIN Place 1 Applicatorful vaginally nightly for 5 days.   ASK your doctor about these medications   aspirin 81 MG EC tablet *ANTIPLATELET*  fluticasone propionate 50 mcg/actuation nasal spray Commonly known as: FLONASE 2 sprays by Nasal route daily for 30 days.  ibuprofen 800 MG tablet Commonly known as: ADVIL,MOTRIN Take 1 tablet (800 mg total) by mouth every 8 (eight) hours.  magnesium 30 mg tablet  metFORMIN 500 MG tablet Commonly known as: GLUCOPHAGE  PRENATAL-1 ORAL "   At very end of interview as wrapping up on above noted. Rt side mid abdoman pain night of thanksgiving. 1-2 minutes at most. Random intemrittent. See hpi. Lmp 11-12-21 to 11-16-21. No pain during interview/exam   Review of Systems  Constitutional:  Negative for chills, fatigue and fever.  Respiratory:  Negative for chest tightness, shortness of breath and wheezing.   Cardiovascular:  Negative for chest pain and palpitations.  Gastrointestinal:  Positive for abdominal pain. Negative for abdominal distention, blood in stool, constipation, diarrhea, nausea, rectal pain and vomiting.       Rt side mid abdoman pain night of thanksgiving. 1-2 minutes at most. Random intemrittent. See hpi. Lmp 11-12-21 to 11-16-21.  Genitourinary:  Negative for dysuria and flank pain.  Musculoskeletal:  Negative for back pain.  Skin:  Positive for rash.       See hpi.  Neurological:  Negative for dizziness, speech difficulty, weakness, numbness and headaches.  Hematological:  Negative for adenopathy. Does not bruise/bleed easily.  Psychiatric/Behavioral:  Negative for behavioral problems, confusion and dysphoric mood. The patient is not nervous/anxious.     Past Medical History:  Diagnosis Date   COVID-19    Gestational diabetes mellitus (GDM), antepartum 02/21/2021   Migraine      Social History   Socioeconomic History   Marital status: Single    Spouse name: Not on file   Number of children: Not on file   Years of education: Not on file   Highest education level: Not on file  Occupational History   Not on file   Tobacco Use   Smoking status: Every Day    Packs/day: 0.10    Years: 5.00    Total pack years: 0.50    Types: Cigarettes   Smokeless tobacco: Never   Tobacco comments:    2 cig daily  Vaping Use   Vaping Use: Former   Substances: Flavoring  Substance and Sexual Activity   Alcohol use: Not Currently   Drug use: Not Currently    Types: Marijuana    Comment: occasional-last use 05/23/2017   Sexual activity: Yes    Birth control/protection: Inserts    Comment: nuvaring   Other Topics Concern   Not on file  Social History Narrative   Lives with roommate   Right handed   Caffeine: 0-1 cup/day   Social Determinants of Health   Financial Resource Strain: Not on file  Food Insecurity: No Food Insecurity (07/13/2021)   Hunger Vital Sign    Worried About Running Out of Food in the Last Year: Never true    Ran Out of Food in the Last Year: Never true  Transportation Needs: No Transportation Needs (07/13/2021)   PRAPARE - Hydrologist (Medical): No    Lack of Transportation (Non-Medical): No  Physical Activity: Not on file  Stress: Not on file  Social Connections: Not on file  Intimate Partner Violence: Not on file    Past Surgical History:  Procedure Laterality Date   NO PAST SURGERIES      Family History  Problem Relation Age of Onset   Hypertension Mother    Diabetes Mother    Breast cancer Paternal Aunt        age at onset 73's   Migraines Neg Hx     Allergies  Allergen Reactions   Azithromycin Anaphylaxis   Cefdinir Other (See Comments)    Had sensation in throat after taking, was getting over a cold at the time.   Fluconazole Rash    Current Outpatient Medications on File Prior to Visit  Medication Sig Dispense Refill   metFORMIN (GLUCOPHAGE) 500 MG tablet Take 1 tablet (500 mg total) by mouth 2 (two) times daily with a meal. 180 tablet 3   metroNIDAZOLE (FLAGYL) 500 MG tablet Take 1 tablet (500 mg total) by mouth 2 (two) times  daily. 14 tablet 0   No current facility-administered medications on file prior to visit.    BP 120/70   Pulse 96   Temp 98 F (36.7 C)   Resp 18   Ht '5\' 5"'$  (1.651 m)   Wt 218 lb (98.9 kg)   SpO2 100%   BMI 36.28 kg/m        Objective:   Physical Exam  General Mental Status- Alert. General Appearance- Not in acute distress.   Skin Hyperpigmented area to rt second digit pip area,  rt 5th digit mcp joint 8 mm broken down area of skin where had former blister and base of thumb small hyperpigmented area(not blisters presenlty)  Also recent blister at base of 4th toe. Now only small  hyperpigmented area.  Neck Carotid Arteries- Normal color. Moisture- Normal Moisture. No carotid bruits. No JVD.  Chest and Lung Exam Auscultation: Breath Sounds:-Normal.  Cardiovascular Auscultation:Rythm- Regular. Murmurs & Other Heart Sounds:Auscultation of the heart reveals- No Murmurs.  Abdomen Inspection:-Inspeection Normal. Palpation/Percussion:Note:No mass. Palpation and Percussion of the abdomen reveal- Non Tender, Non Distended + BS, no rebound or guarding.  Protuberant abdomen with some pain to the right than just below umbilicus.  No masses felt.  No obvious hernia felt.  No warmth to the skin and no redness.    Neurologic Cranial Nerve exam:- CN III-XII intact(No nystagmus), symmetric smile. Drift Test:- No drift. Romberg Exam:- Negative.  Heal to Toe Gait exam:-Normal. Finger to Nose:- Normal/Intact Strength:- 5/5 equal and symmetric strength both upper and lower extremities.   Pt states hx of some recent new onset blisters to rt second digit pip join,  rt 5th digit mcp join and base of thumb.(All on dorsal aspect).  Also recent blister at base of 4th toe.     Assessment & Plan:   Patient Instructions  Recent blister eruption after use of diflucan. Given steroid in ED and then used benadryl. Probable allergic reaction. If any new blister eruption occur in future will  refer to dermatologist.  For the time being not to use diflucan.  Presently getting wound culture from area base of rt 5th digit to see if bacteria grows out. Pending that result use mupirocin topical ointment twice daily.  Intermittent low-level transient right-sided abdomen pain.  Negative associated features on review.  Also not associated with eating.  Explained would get CBC, CMP and lipase level.  Will update patient on lab results and asked her to update our office on any changing or worsening signs or symptoms.  Changes signs and symptoms might direct next diagnostic step.  Particularly might consider imaging studies.  Patient expressed understanding.  Follow up in 2 weeks or sooner if needed.   Mackie Pai, PA-C

## 2021-12-06 LAB — WOUND CULTURE
MICRO NUMBER:: 14233467
SPECIMEN QUALITY:: ADEQUATE

## 2021-12-24 ENCOUNTER — Encounter: Payer: Self-pay | Admitting: Medical

## 2021-12-26 ENCOUNTER — Other Ambulatory Visit: Payer: Self-pay | Admitting: Medical

## 2021-12-26 ENCOUNTER — Ambulatory Visit (INDEPENDENT_AMBULATORY_CARE_PROVIDER_SITE_OTHER): Payer: Medicaid Other | Admitting: Medical

## 2021-12-26 ENCOUNTER — Encounter: Payer: Self-pay | Admitting: Medical

## 2021-12-26 VITALS — BP 112/66 | HR 90 | Temp 98.0°F | Resp 18 | Ht 65.0 in | Wt 218.6 lb

## 2021-12-26 DIAGNOSIS — J309 Allergic rhinitis, unspecified: Secondary | ICD-10-CM | POA: Diagnosis not present

## 2021-12-26 DIAGNOSIS — R1010 Upper abdominal pain, unspecified: Secondary | ICD-10-CM

## 2021-12-26 MED ORDER — FAMOTIDINE 20 MG PO TABS: 20.0000 mg | ORAL_TABLET | Freq: Every day | ORAL | 0 refills | Status: DC

## 2021-12-26 MED ORDER — BENZONATATE 100 MG PO CAPS: 100.0000 mg | ORAL_CAPSULE | Freq: Three times a day (TID) | ORAL | 0 refills | Status: DC | PRN

## 2021-12-26 MED ORDER — FLUTICASONE PROPIONATE 50 MCG/ACT NA SUSP: 2.0000 | Freq: Every day | NASAL | 1 refills | Status: DC

## 2021-12-26 MED ORDER — LEVOCETIRIZINE DIHYDROCHLORIDE 5 MG PO TABS: 5.0000 mg | ORAL_TABLET | Freq: Every evening | ORAL | 3 refills | Status: DC

## 2021-12-26 NOTE — Patient Instructions (Addendum)
Recent gassiness and epigastric area discomfort but now resolved. Recommend healthy diet and can consider probiotic use. If gassiness and epigastric discomfort return can use gas-x and famotadine.   Recent early on nasal congestion, pnd and random cough. Will treat for allergies. Does not appear infectious like symptoms presently. If symptoms change or worse let me know. Rx flonase, xyzal and making benzonatate available to use for cough if needed.

## 2021-12-26 NOTE — Progress Notes (Signed)
Subjective:    Patient ID: Melanie Cruz, female    DOB: 12-28-1995, 28 y.o.   MRN: [id number]  HPI  She states on weekend got gassy and had some diarrhea. She took some peptbismal and helped some. But when woke felt some mild soreness to abdomen.  Over past 2 days stomach signs and symptoms have resolve. When felt gassy most of pain was epigastric.  Pt does admit eating fried foods. Some belching.   She got mild nasal congestion, pnd and random cough for one day. PND prominent when woke up. No sneezing.  When younger she had year round allergies.   Pt not breast feeding her 50 month old.  Review of Systems  Constitutional:  Negative for chills and fatigue.  HENT:  Positive for congestion and postnasal drip.   Respiratory:  Negative for cough, chest tightness and wheezing.   Cardiovascular:  Negative for chest pain and palpitations.  Musculoskeletal:  Negative for back pain and joint swelling.  Neurological:  Negative for dizziness, seizures, light-headedness and headaches.  Hematological:  Negative for adenopathy. Does not bruise/bleed easily.  Psychiatric/Behavioral:  Negative for behavioral problems and confusion.     Past Medical History:  Diagnosis Date   COVID-19    Gestational diabetes mellitus (GDM), antepartum 02/21/2021   Migraine      Social History   Socioeconomic History   Marital status: Single    Spouse name: Not on file   Number of children: Not on file   Years of education: Not on file   Highest education level: Not on file  Occupational History   Not on file  Tobacco Use   Smoking status: Every Day    Packs/day: 0.10    Years: 5.00    Total pack years: 0.50    Types: Cigarettes   Smokeless tobacco: Never   Tobacco comments:    2 cig daily  Vaping Use   Vaping Use: Former   Substances: Flavoring  Substance and Sexual Activity   Alcohol use: Not Currently   Drug use: Not Currently    Types: Marijuana    Comment: occasional-last use  05/23/2017   Sexual activity: Yes    Birth control/protection: Inserts    Comment: nuvaring   Other Topics Concern   Not on file  Social History Narrative   Lives with roommate   Right handed   Caffeine: 0-1 cup/day   Social Determinants of Health   Financial Resource Strain: Not on file  Food Insecurity: No Food Insecurity (07/13/2021)   Hunger Vital Sign    Worried About Running Out of Food in the Last Year: Never true    Ran Out of Food in the Last Year: Never true  Transportation Needs: No Transportation Needs (07/13/2021)   PRAPARE - Hydrologist (Medical): No    Lack of Transportation (Non-Medical): No  Physical Activity: Not on file  Stress: Not on file  Social Connections: Not on file  Intimate Partner Violence: Not on file    Past Surgical History:  Procedure Laterality Date   NO PAST SURGERIES      Family History  Problem Relation Age of Onset   Hypertension Mother    Diabetes Mother    Breast cancer Paternal Aunt        age at onset 73's   Migraines Neg Hx     Allergies  Allergen Reactions   Azithromycin Anaphylaxis   Cefdinir Other (See Comments)    Had  sensation in throat after taking, was getting over a cold at the time.   Fluconazole Rash    Current Outpatient Medications on File Prior to Visit  Medication Sig Dispense Refill   metFORMIN (GLUCOPHAGE) 500 MG tablet Take 1 tablet (500 mg total) by mouth 2 (two) times daily with a meal. 180 tablet 3   No current facility-administered medications on file prior to visit.    BP 112/66   Pulse 90   Temp 98 F (36.7 C)   Resp 18   Ht '5\' 5"'$  (1.651 m)   Wt 218 lb 9.6 oz (99.2 kg)   SpO2 99%   BMI 36.38 kg/m        Objective:   Physical Exam  General- No acute distress. Pleasant patient. Neck- Full range of motion, no jvd Lungs- Clear, even and unlabored. Heart- regular rate and rhythm. Neurologic- CNII- XII grossly intact.   Heent- no sinus pressure. Canals  with some wax. Tm normal. No sinus pressure, boggy turbinates, +pnd.      Assessment & Plan:   Patient Instructions  Recent gassiness and epigastric area discomfort but now resolved. Recommend healthy diet and can consider probiotic use. If gassiness and epigastric discomfort return can use gas-x and famotadine.   Recent early on nasal congestion, pnd and random cough. Will treat for allergies. Does not appear infectious like symptoms presently. If symptoms change or worse let me know. Rx flonase, xyzal and making benzonatate available to use for cough if needed.     Mackie Pai, PA-C

## 2021-12-28 ENCOUNTER — Ambulatory Visit: Payer: Medicaid Other | Admitting: Obstetrics and Gynecology

## 2022-01-25 ENCOUNTER — Other Ambulatory Visit (HOSPITAL_COMMUNITY)
Admission: RE | Admit: 2022-01-25 | Discharge: 2022-01-25 | Disposition: A | Discharge status: Home / Self Care | Payer: Medicaid Other | Source: Ambulatory Visit | Attending: Advanced Practice Midwife | Admitting: Advanced Practice Midwife

## 2022-01-25 ENCOUNTER — Encounter: Payer: Self-pay | Admitting: Obstetrics and Gynecology

## 2022-01-25 ENCOUNTER — Ambulatory Visit (INDEPENDENT_AMBULATORY_CARE_PROVIDER_SITE_OTHER): Payer: Medicaid Other | Admitting: Obstetrics and Gynecology

## 2022-01-25 VITALS — BP 113/70 | HR 97 | Wt 217.0 lb

## 2022-01-25 DIAGNOSIS — Z30017 Encounter for initial prescription of implantable subdermal contraceptive: Secondary | ICD-10-CM | POA: Diagnosis present

## 2022-01-25 DIAGNOSIS — Z3202 Encounter for pregnancy test, result negative: Secondary | ICD-10-CM | POA: Diagnosis not present

## 2022-01-25 DIAGNOSIS — L0292 Furuncle, unspecified: Secondary | ICD-10-CM | POA: Diagnosis not present

## 2022-01-25 LAB — POCT URINE PREGNANCY: Preg Test, Ur: NEGATIVE

## 2022-01-25 MED ORDER — ETONOGESTREL 68 MG ~~LOC~~ IMPL
68.0000 mg | DRUG_IMPLANT | Freq: Once | SUBCUTANEOUS | Status: AC
  Administered 2022-01-25: 68 mg via SUBCUTANEOUS

## 2022-01-25 NOTE — Progress Notes (Signed)
GYNECOLOGY VISIT  Patient name: Melanie Cruz MRN [id number]  Date of birth: 1995/06/22 Chief Complaint:   Contraception  History:  Melanie Cruz is a 27 y.o. [phone] being seen today for nexplanon insertion. Does not want to keep up with daily pills, does not want something inserted in the uterus, did not like placing nuvaring, and doesn't want the patch. Has used nexplanon previously - had 3-4 menses a year and reliable at preventing pregnancy. Has not completed childbearing. Would also like STI testing   Also noted to have boil on lower inner thigh near gluteal fold that burst after application of hot compress. Has had it return at least 3 times. Additional "boil" on other inner thigh near vulva and one in right under arm. Not sure if previously diagnosed with HS but would like to see derm for evaluation.   Past Medical History:  Diagnosis Date   COVID-19    Gestational diabetes mellitus (GDM), antepartum 02/21/2021   Migraine     Past Surgical History:  Procedure Laterality Date   NO PAST SURGERIES      The following portions of the patient's history were reviewed and updated as appropriate: allergies, current medications, past family history, past medical history, past social history, past surgical history and problem list.   Health Maintenance:   Last pap     Component Value Date/Time   DIAGPAP  11/24/2019 1512    - Negative for intraepithelial lesion or malignancy (NILM)   DIAGPAP  02/13/2017 0000    NEGATIVE FOR INTRAEPITHELIAL LESIONS OR MALIGNANCY.   DIAGPAP  02/13/2017 0000    FUNGAL ORGANISMS PRESENT CONSISTENT WITH CANDIDA SPP.   ADEQPAP  11/24/2019 1512    Satisfactory for evaluation; transformation zone component PRESENT.   ADEQPAP  02/13/2017 0000    Satisfactory for evaluation  endocervical/transformation zone component PRESENT.    Last mammogram: n/a   Review of Systems:  Pertinent items are noted in HPI. Comprehensive review of systems was  otherwise negative.   Objective:  Physical Exam BP 113/70   Pulse 97   Wt 217 lb (98.4 kg)   LMP 01/17/2022 (Exact Date)   BMI 36.11 kg/m    Physical Exam Vitals and nursing note reviewed. Exam conducted with a chaperone present.  Constitutional:      Appearance: Normal appearance.  HENT:     Head: Normocephalic and atraumatic.  Pulmonary:     Effort: Pulmonary effort is normal.  Genitourinary:    Exam position: Lithotomy position.       Comments: ~4cm area of induration including lower gluteus and mostly inner thig, 80m opening with purulent discharge, tender to palpation  Small area on right groin fold with 3101mopening, minimal erythema and no discharge  Skin:    General: Skin is warm and dry.  Neurological:     General: No focal deficit present.     Mental Status: She is alert.  Psychiatric:        Mood and Affect: Mood normal.        Behavior: Behavior normal.        Thought Content: Thought content normal.        Judgment: Judgment normal.    Nexplanon Insertion Procedure Patient identified, informed consent performed, consent signed.   Patient does understand that irregular bleeding is a very common side effect of this medication. She was advised to have backup contraception for one week after placement. Pregnancy test in clinic today was negative.  Appropriate  time out taken.  Patient's left arm was prepped and draped in the usual sterile fashion. The area of insertion was noted on the left arm.  Patient was prepped with alcohol swab and then injected with 3 ml of 1% lidocaine.  She was prepped with betadine, Nexplanon removed from packaging,  Device confirmed in needle, then inserted full length of needle and withdrawn per handbook instructions. Nexplanon was able to palpated in the patient's arm; patient  palpated the insert herself. There was minimal blood loss.  Patient insertion site covered with guaze and a pressure bandage to reduce any bruising.  The patient  tolerated the procedure well and was given post procedure instructions.    Labs and Imaging Lab Results  Component Value Date   PREGTESTUR Negative 01/25/2022        Assessment & Plan:   1. Nexplanon insertion S/p uncomplicated insertion - discussed likely changes in menstrual pattern, postprocedure and use of back for next week - POCT urine pregnancy - etonogestrel (NEXPLANON) implant 68 mg - Cervicovaginal ancillary only( [place])  2. Recurrent boils Cultured 'boil' on inner thigh. Derm referral placed for possible HS given recurrence and location  Will rx abx pending culture  - Anaerobic and Aerobic Culture   Routine preventative health maintenance measures emphasized.  Darliss Cheney, MD [facility] [place]

## 2022-01-28 LAB — CERVICOVAGINAL ANCILLARY ONLY
Bacterial Vaginitis (gardnerella): POSITIVE — AB
Candida Glabrata: NEGATIVE
Candida Vaginitis: POSITIVE — AB
Chlamydia: NEGATIVE
Comment: NEGATIVE
Comment: NEGATIVE
Comment: NEGATIVE
Comment: NEGATIVE
Comment: NEGATIVE
Comment: NORMAL
Neisseria Gonorrhea: NEGATIVE
Trichomonas: NEGATIVE

## 2022-01-29 ENCOUNTER — Other Ambulatory Visit: Payer: Self-pay | Admitting: Obstetrics and Gynecology

## 2022-01-29 ENCOUNTER — Encounter: Payer: Self-pay | Admitting: General Practice

## 2022-01-29 DIAGNOSIS — B3731 Acute candidiasis of vulva and vagina: Secondary | ICD-10-CM

## 2022-01-29 DIAGNOSIS — B9689 Other specified bacterial agents as the cause of diseases classified elsewhere: Secondary | ICD-10-CM

## 2022-01-29 MED ORDER — METRONIDAZOLE 500 MG PO TABS: 500.0000 mg | ORAL_TABLET | Freq: Two times a day (BID) | ORAL | 0 refills | Status: AC

## 2022-01-29 MED ORDER — TERCONAZOLE 0.8 % VA CREA: 1.0000 | TOPICAL_CREAM | Freq: Every day | VAGINAL | 0 refills | Status: DC

## 2022-01-30 LAB — ANAEROBIC AND AEROBIC CULTURE

## 2022-01-31 ENCOUNTER — Encounter: Payer: Self-pay | Admitting: Medical

## 2022-01-31 NOTE — Addendum Note (Signed)
Addended by: Cindi Carbon on: 01/31/2022 06:22 PM   Modules accepted: Orders

## 2022-02-05 ENCOUNTER — Ambulatory Visit: Payer: Medicaid Other | Admitting: Advanced Practice Midwife

## 2022-02-18 ENCOUNTER — Ambulatory Visit: Payer: BC Managed Care – PPO | Admitting: Internal Medicine

## 2022-03-04 ENCOUNTER — Ambulatory Visit (INDEPENDENT_AMBULATORY_CARE_PROVIDER_SITE_OTHER): Payer: Medicaid Other | Admitting: Medical

## 2022-03-04 ENCOUNTER — Encounter: Payer: Self-pay | Admitting: Medical

## 2022-03-04 VITALS — BP 122/78 | HR 94 | Resp 18 | Ht 65.0 in | Wt 214.0 lb

## 2022-03-04 DIAGNOSIS — R739 Hyperglycemia, unspecified: Secondary | ICD-10-CM

## 2022-03-04 DIAGNOSIS — K219 Gastro-esophageal reflux disease without esophagitis: Secondary | ICD-10-CM | POA: Diagnosis not present

## 2022-03-04 DIAGNOSIS — R0781 Pleurodynia: Secondary | ICD-10-CM | POA: Diagnosis not present

## 2022-03-04 LAB — COMPREHENSIVE METABOLIC PANEL
ALT: 21 U/L (ref 0–35)
AST: 14 U/L (ref 0–37)
Albumin: 4.1 g/dL (ref 3.5–5.2)
Alkaline Phosphatase: 41 U/L (ref 39–117)
BUN: 15 mg/dL (ref 6–23)
CO2: 23 mEq/L (ref 19–32)
Calcium: 9.2 mg/dL (ref 8.4–10.5)
Chloride: 109 mEq/L (ref 96–112)
Creatinine, Ser: 0.69 mg/dL (ref 0.40–1.20)
GFR: 117.61 mL/min (ref >60.00)
Glucose, Bld: 104 mg/dL — ABNORMAL HIGH (ref 70–99)
Potassium: 3.8 mEq/L (ref 3.5–5.1)
Sodium: 139 mEq/L (ref 135–145)
Total Bilirubin: 0.3 mg/dL (ref 0.2–1.2)
Total Protein: 7.1 g/dL (ref 6.0–8.3)

## 2022-03-04 LAB — HEMOGLOBIN A1C: Hgb A1c MFr Bld: 6.1 % (ref 4.6–6.5)

## 2022-03-04 NOTE — Progress Notes (Signed)
Subjective:    Patient ID: Melanie Cruz, female    DOB: 08/25/1995, 27 y.o.   MRN: [id number]  HPI  Pt in stating recent pain in rib area just beneath her breast on both sides. This pain was present for 5-10 minutes intermittently. When asked to specifiy frequency hard for pt to remember details. Pt did change her bra and has pain under breast resolved(she thinks bra wire was not fitting correctly). No rash under her breast.  Pt also reporting some occasional intermittent epigastric pain. Stats last time had heart burn like pain was one week ago. States some reflux and belch about 1-2 times every 2 weeks. No reported ruq pain after eating.   A1c in prediabetic range. Pt on metformin. Tolerated 1 tab daily. She indicated will increase to twice daily.   Review of Systems  Constitutional:  Negative for chills, fatigue and fever.  Respiratory:  Negative for cough, chest tightness, shortness of breath and wheezing.   Genitourinary:  Negative for dyspareunia and flank pain.       Faint epigastric pain on exam today.  Musculoskeletal:  Negative for back pain, myalgias and neck pain.  Skin:  Negative for rash.  Neurological:  Negative for dizziness, seizures and headaches.  Hematological:  Negative for adenopathy. Does not bruise/bleed easily.  Psychiatric/Behavioral:  Negative for decreased concentration. The patient is nervous/anxious.        Gyn gave buspar in the past for anxiety. She has some presently. Does help if she takes daily.    Past Medical History:  Diagnosis Date   COVID-19    Gestational diabetes mellitus (GDM), antepartum 02/21/2021   Migraine      Social History   Socioeconomic History   Marital status: Single    Spouse name: Not on file   Number of children: Not on file   Years of education: Not on file   Highest education level: Not on file  Occupational History   Not on file  Tobacco Use   Smoking status: Every Day    Packs/day: 0.10    Years: 5.00     Total pack years: 0.50    Types: Cigarettes   Smokeless tobacco: Never   Tobacco comments:    2 cig daily  Vaping Use   Vaping Use: Former   Substances: Flavoring  Substance and Sexual Activity   Alcohol use: Not Currently   Drug use: Not Currently    Types: Marijuana    Comment: occasional-last use 05/23/2017   Sexual activity: Yes    Birth control/protection: Inserts    Comment: nuvaring   Other Topics Concern   Not on file  Social History Narrative   Lives with roommate   Right handed   Caffeine: 0-1 cup/day   Social Determinants of Health   Financial Resource Strain: Not on file  Food Insecurity: No Food Insecurity (07/13/2021)   Hunger Vital Sign    Worried About Running Out of Food in the Last Year: Never true    Ran Out of Food in the Last Year: Never true  Transportation Needs: No Transportation Needs (07/13/2021)   PRAPARE - Hydrologist (Medical): No    Lack of Transportation (Non-Medical): No  Physical Activity: Not on file  Stress: Not on file  Social Connections: Not on file  Intimate Partner Violence: Not on file    Past Surgical History:  Procedure Laterality Date   NO PAST SURGERIES      Family  History  Problem Relation Age of Onset   Hypertension Mother    Diabetes Mother    Breast cancer Paternal Aunt        age at onset 13's   Migraines Neg Hx     Allergies  Allergen Reactions   Azithromycin Anaphylaxis   Cefdinir Other (See Comments)    Had sensation in throat after taking, was getting over a cold at the time.   Fluconazole Rash    Current Outpatient Medications on File Prior to Visit  Medication Sig Dispense Refill   fluticasone (FLONASE) 50 MCG/ACT nasal spray SHAKE LIQUID AND USE 2 SPRAYS IN EACH NOSTRIL DAILY 48 g 2   levocetirizine (XYZAL) 5 MG tablet Take 1 tablet (5 mg total) by mouth every evening. 30 tablet 3   metFORMIN (GLUCOPHAGE) 500 MG tablet Take 1 tablet (500 mg total) by mouth 2 (two) times  daily with a meal. 180 tablet 3   benzonatate (TESSALON) 100 MG capsule Take 1 capsule (100 mg total) by mouth 3 (three) times daily as needed for cough. (Patient not taking: Reported on 01/25/2022) 30 capsule 0   famotidine (PEPCID) 20 MG tablet TAKE 1 TABLET(20 MG) BY MOUTH DAILY (Patient not taking: Reported on 03/04/2022) 90 tablet 2   terconazole (TERAZOL 3) 0.8 % vaginal cream Place 1 applicator vaginally at bedtime. Apply nightly for three nights. (Patient not taking: Reported on 03/04/2022) 20 g 0   No current facility-administered medications on file prior to visit.    BP 122/78 (BP Location: Right Arm, Patient Position: Sitting, Cuff Size: Normal)   Pulse 94   Resp 18   Ht '5\' 5"'$  (1.651 m)   Wt 214 lb (97.1 kg)   SpO2 100%   Breastfeeding No   BMI 35.61 kg/m        Objective:   Physical Exam  General Mental Status- Alert. General Appearance- Not in acute distress.   Skin General: Color- Normal Color. Moisture- Normal Moisture.  Neck Carotid Arteries- Normal color. Moisture- Normal Moisture. No carotid bruits. No JVD.  Chest and Lung Exam Auscultation: Breath Sounds:-Normal.  Cardiovascular Auscultation:Rythm- Regular. Murmurs & Other Heart Sounds:Auscultation of the heart reveals- No Murmurs.  Abdomen Inspection:-Inspeection Normal. Palpation/Percussion:Note:No mass. Palpation and Percussion of the abdomen reveal- Non Tender, Non Distended + BS, no rebound or guarding.   Neurologic Cranial Nerve exam:- CN III-XII intact(No nystagmus), symmetric smile. Strength:- 5/5 equal and symmetric strength both upper and lower extremities.       Assessment & Plan:   Patient Instructions  Pain under breast/lower region appeared to get better after you changed bra. On inspection. No rash or abnormality under your breast/rib area. If you note recurrent pain lt me know. If itchy rash under breat then rx nystatin.  Mild intermittent gerd type symptoms described. Recommend  healthy diet and famotadine as needed.  For elevated sugar recommend low sugar diet and can increase metfomrin to twice a day. Will go ahead and get cmp and A1c.  Follow up date to be determined after lab review.      Mackie Pai, PA-C

## 2022-03-04 NOTE — Addendum Note (Signed)
Addended by: Anabel Halon on: 03/04/2022 11:00 AM   Modules accepted: Orders

## 2022-03-04 NOTE — Patient Instructions (Addendum)
Pain under breast/lower region appeared to get better after you changed bra. On inspection. No rash or abnormality under your breast/rib area. If you note recurrent pain lt me know. If itchy rash under breat then rx nystatin.  Mild intermittent gerd type symptoms described. Recommend healthy diet and famotadine as needed.  For elevated sugar recommend low sugar diet and can increase metfomrin to twice a day. Will go ahead and get cmp and A1c.  Follow up date to be determined after lab review.

## 2022-03-10 NOTE — Progress Notes (Deleted)
Office Visit Note  Patient: Melanie Cruz             Date of Birth: July 02, 1995           MRN: [id number]             PCP: Elise Benne Referring: Elise Benne Visit Date: 03/11/2022   Subjective:  No chief complaint on file.   History of Present Illness: Melanie Cruz is a 27 y.o. female here for follow up ***   Previous HPI 03/09/21 Melanie Cruz is a 27 y.o. female here for evaluation of positive ANA, rashes, itching, and lymphadenopathy.  Her medical history includes prediabetes, past left knee tibial tuberosity avulsion, and tobacco use. She is currently pregnant in the second trimester.  She started to have itching affected bilateral hands and feet with a few areas of hyperpigmentation that started last summer. This occasionally becomes burning type pain if she scratches excessively. Also found to have enlarged cervical lymph nodes in PCP office exam. She does not use any medication or treatments for this. Symptoms have decreased in severity compared to the start. Lab workup showed low positive ANA and mildly elevated sedimentation rate. Prenatal evaluation included SSA and SSB antibody testing that was negative. She has occasional painful ulcers or sores on gingival surfaces. She has right sided intermittent painless lymph node swelling. She denies raynaud's symptoms, leg edema, or history of abnormal bleeding or blood clots.   Labs reviewed 12/2020 ANA 1:40 homogenous SSA, SSB neg RF neg ESR 34 CRP neg     No Rheumatology ROS completed.   PMFS History:  Patient Active Problem List   Diagnosis Date Noted   Patellar tendinitis of left knee 04/04/2021   Positive ANA (antinuclear antibody) 03/09/2021   Rash and other nonspecific skin eruption 03/09/2021   Sedimentation rate elevation 03/09/2021   Diarrhea 11/07/2020   PCOS (polycystic ovarian syndrome) 09/29/2020   Prediabetes 04/06/2020   Migraine without aura and without status migrainosus, not  intractable 03/06/2020   Tension type headache 03/06/2020   Avulsion fracture of tibial tuberosity 02/25/2020   Chronic rhinitis 03/25/2019   Furunculosis of multiple sites 04/15/2017   Tobacco abuse counseling 03/13/2017    Past Medical History:  Diagnosis Date   COVID-19    Gestational diabetes mellitus (GDM), antepartum 02/21/2021   Migraine     Family History  Problem Relation Age of Onset   Hypertension Mother    Diabetes Mother    Breast cancer Paternal Aunt        age at onset 59's   Migraines Neg Hx    Past Surgical History:  Procedure Laterality Date   NO PAST SURGERIES     Social History   Social History Narrative   Lives with roommate   Right handed   Caffeine: 0-1 cup/day   Immunization History  Administered Date(s) Administered   MMR 09/10/2017   Tdap 03/15/2017, 09/10/2017     Objective: Vital Signs: There were no vitals taken for this visit.   Physical Exam   Musculoskeletal Exam: ***  CDAI Exam: CDAI Score: -- Patient Global: --; Provider Global: -- Swollen: --; Tender: -- Joint Exam 03/11/2022   No joint exam has been documented for this visit   There is currently no information documented on the homunculus. Go to the Rheumatology activity and complete the homunculus joint exam.  Investigation: No additional findings.  Imaging: No results found.  Recent Labs: Lab Results  Component Value Date  WBC 6.9 12/03/2021   HGB 12.0 12/03/2021   PLT 208.0 12/03/2021   NA 139 03/04/2022   K 3.8 03/04/2022   CL 109 03/04/2022   CO2 23 03/04/2022   GLUCOSE 104 (H) 03/04/2022   BUN 15 03/04/2022   CREATININE 0.69 03/04/2022   BILITOT 0.3 03/04/2022   ALKPHOS 41 03/04/2022   AST 14 03/04/2022   ALT 21 03/04/2022   PROT 7.1 03/04/2022   ALBUMIN 4.1 03/04/2022   CALCIUM 9.2 03/04/2022   GFRAA >60 05/24/2019    Speciality Comments: No specialty comments available.  Procedures:  No procedures performed Allergies: Azithromycin,  Cefdinir, and Fluconazole   Assessment / Plan:     Visit Diagnoses: No diagnosis found.  ***  Orders: No orders of the defined types were placed in this encounter.  No orders of the defined types were placed in this encounter.    Follow-Up Instructions: No follow-ups on file.   Collier Salina, MD  Note - This record has been created using [employer].  Chart creation errors have been sought, but may not always  have been located. Such creation errors do not reflect on  the standard of medical care.

## 2022-03-11 ENCOUNTER — Ambulatory Visit: Payer: BC Managed Care – PPO | Admitting: Internal Medicine

## 2022-03-26 NOTE — Progress Notes (Signed)
Office Visit Note  Patient: Melanie Cruz             Date of Birth: 1995-01-14           MRN: [id number]             PCP: Marisue Brooklyn Referring: Marisue Brooklyn Visit Date: 03/27/2022   Subjective:  Follow-up   History of Present Illness: Melanie Cruz is a 27 y.o. female here for follow up here for follow-up monitoring with abnormal labs positive ANA and sedimentation rate.  Compared to last visit she delivered her child without severe complications and has been feeling overall reasonably well without the severe extent of skin irritation or mouth lesions as before.  Does have a lot of fatigue but also has disrupted sleep due to newborn.  Still getting some joint pains in multiple areas not associated with swelling or warmth, maybe seeing discoloration.  Previous HPI 03/09/21 Melanie Cruz is a 27 y.o. female here for evaluation of positive ANA, rashes, itching, and lymphadenopathy.  Her medical history includes prediabetes, past left knee tibial tuberosity avulsion, and tobacco use. She is currently pregnant in the second trimester.  She started to have itching affected bilateral hands and feet with a few areas of hyperpigmentation that started last summer. This occasionally becomes burning type pain if she scratches excessively. Also found to have enlarged cervical lymph nodes in PCP office exam. She does not use any medication or treatments for this. Symptoms have decreased in severity compared to the start. Lab workup showed low positive ANA and mildly elevated sedimentation rate. Prenatal evaluation included SSA and SSB antibody testing that was negative. She has occasional painful ulcers or sores on gingival surfaces. She has right sided intermittent painless lymph node swelling. She denies raynaud's symptoms, leg edema, or history of abnormal bleeding or blood clots.   Labs reviewed 12/2020 ANA 1:40 homogenous SSA, SSB neg RF neg ESR 34 CRP neg   Review of  Systems  Constitutional:  Positive for fatigue.  HENT: Negative.  Negative for mouth sores and mouth dryness.   Eyes: Negative.  Negative for dryness.  Respiratory: Negative.  Negative for shortness of breath.   Cardiovascular: Negative.  Negative for chest pain and palpitations.  Gastrointestinal: Negative.  Negative for blood in stool, constipation and diarrhea.  Endocrine: Negative.  Negative for increased urination.  Genitourinary: Negative.  Negative for involuntary urination.  Musculoskeletal:  Positive for joint pain and joint pain. Negative for gait problem, joint swelling, myalgias, muscle weakness, morning stiffness, muscle tenderness and myalgias.  Skin: Negative.  Negative for color change, rash, hair loss and sensitivity to sunlight.  Allergic/Immunologic: Negative.  Negative for susceptible to infections.  Neurological: Negative.  Negative for dizziness and headaches.  Hematological: Negative.  Negative for swollen glands.  Psychiatric/Behavioral:  Positive for depressed mood and suicidal ideas. Negative for sleep disturbance. The patient is nervous/anxious.     PMFS History:  Patient Active Problem List   Diagnosis Date Noted   Patellar tendinitis of left knee 04/04/2021   Positive ANA (antinuclear antibody) 03/09/2021   Rash and other nonspecific skin eruption 03/09/2021   Sedimentation rate elevation 03/09/2021   Diarrhea 11/07/2020   PCOS (polycystic ovarian syndrome) 09/29/2020   Prediabetes 04/06/2020   Migraine without aura and without status migrainosus, not intractable 03/06/2020   Tension type headache 03/06/2020   Avulsion fracture of tibial tuberosity 02/25/2020   Chronic rhinitis 03/25/2019   Furunculosis of multiple sites 04/15/2017  Tobacco abuse counseling 03/13/2017    Past Medical History:  Diagnosis Date   COVID-19    Gestational diabetes mellitus (GDM), antepartum 02/21/2021   Migraine     Family History  Problem Relation Age of Onset    Hypertension Mother    Diabetes Mother    Breast cancer Paternal Aunt        age at onset 49's   Migraines Neg Hx    Past Surgical History:  Procedure Laterality Date   NO PAST SURGERIES     Social History   Social History Narrative   Lives with roommate   Right handed   Caffeine: 0-1 cup/day   Immunization History  Administered Date(s) Administered   MMR 09/10/2017   Tdap 03/15/2017, 09/10/2017     Objective: Vital Signs: BP 111/73 (BP Location: Left Arm, Patient Position: Sitting, Cuff Size: Large)   Pulse 77   Resp 16   Ht 5\' 5"  (1.651 m)   Wt 214 lb (97.1 kg)   BMI 35.61 kg/m    Physical Exam Constitutional:      Appearance: She is obese.  Eyes:     Conjunctiva/sclera: Conjunctivae normal.  Cardiovascular:     Rate and Rhythm: Normal rate and regular rhythm.  Pulmonary:     Effort: Pulmonary effort is normal.     Breath sounds: Normal breath sounds.  Lymphadenopathy:     Cervical: No cervical adenopathy.  Skin:    General: Skin is warm and dry.     Findings: Rash present.     Comments: Faintly hyperpigmented changes on dorsum of hands and fingers with no papules, no synovitis  Neurological:     Mental Status: She is alert.  Psychiatric:        Mood and Affect: Mood normal.      Musculoskeletal Exam:  Shoulders full ROM no tenderness or swelling Elbows full ROM no tenderness or swelling Wrists full ROM no tenderness or swelling Fingers full ROM no tenderness or swelling Knees full ROM no tenderness or swelling   Investigation: No additional findings.  Imaging: DG Foot Complete Right  Result Date: 04/09/2022 CLINICAL DATA:  Several month history of worsening right calcaneal pain. No known injury. EXAM: RIGHT FOOT COMPLETE - 3 VIEW COMPARISON:  None Available. FINDINGS: There is no evidence of fracture or dislocation. Dorsal calcaneal spur. Soft tissues are unremarkable. IMPRESSION: No acute findings. Dorsal calcaneal spur, which can be seen with  Achilles enthesopathy. Electronically Signed   By: Agustin Cree M.D.   On: 04/09/2022 09:30    Recent Labs: Lab Results  Component Value Date   WBC 6.6 04/26/2022   HGB 12.3 04/26/2022   PLT 211.0 04/26/2022   NA 139 04/26/2022   K 3.9 04/26/2022   CL 108 04/26/2022   CO2 22 04/26/2022   GLUCOSE 107 (H) 04/26/2022   BUN 10 04/26/2022   CREATININE 0.68 04/26/2022   BILITOT 0.3 04/26/2022   ALKPHOS 42 04/26/2022   AST 14 04/26/2022   ALT 14 04/26/2022   PROT 7.1 04/26/2022   ALBUMIN 4.3 04/26/2022   CALCIUM 8.8 04/26/2022   GFRAA >60 05/24/2019    Speciality Comments: No specialty comments available.  Procedures:  No procedures performed Allergies: Azithromycin, Cefdinir, and Fluconazole   Assessment / Plan:     Visit Diagnoses: Positive ANA (antinuclear antibody) - Plan: ANA, Sedimentation rate, C3 and C4 Sedimentation rate elevation  Plan to recheck the positive ANA and elevated sedimentation rate for which I did not have  a good answer last year.  Still no peripheral synovitis appreciable on exam today.  Unless considerably increased or new findings such as hypocomplementemia would overall be less suspicious for systemic autoimmune disease especially with lack of symptom progression after another year without treatment.  Rash and other nonspecific skin eruption  Continue skin rash is not very visibly impressive compared to the extent of ongoing itching symptoms.  May be benign such as atopic dermatitis.  Is not in a malar distribution nor typical photosensitivity.  Tobacco use  Ongoing daily cigarette smoking also potential contributor to persistent elevated sedimentation rate.  Orders: Orders Placed This Encounter  Procedures   ANA   Sedimentation rate   C3 and C4   Anti-nuclear ab-titer (ANA titer)   No orders of the defined types were placed in this encounter.    Follow-Up Instructions: No follow-ups on file.   Fuller Plan, MD  Note - This record  has been created using [employer].  Chart creation errors have been sought, but may not always  have been located. Such creation errors do not reflect on  the standard of medical care.

## 2022-03-27 ENCOUNTER — Encounter: Payer: Self-pay | Admitting: Obstetrics and Gynecology

## 2022-03-27 ENCOUNTER — Encounter: Payer: Self-pay | Admitting: Internal Medicine

## 2022-03-27 ENCOUNTER — Ambulatory Visit: Payer: Medicaid Other | Attending: Internal Medicine | Admitting: Internal Medicine

## 2022-03-27 VITALS — BP 111/73 | HR 77 | Resp 16 | Ht 65.0 in | Wt 214.0 lb

## 2022-03-27 DIAGNOSIS — R21 Rash and other nonspecific skin eruption: Secondary | ICD-10-CM

## 2022-03-27 DIAGNOSIS — R700 Elevated erythrocyte sedimentation rate: Secondary | ICD-10-CM | POA: Diagnosis not present

## 2022-03-27 DIAGNOSIS — R768 Other specified abnormal immunological findings in serum: Secondary | ICD-10-CM

## 2022-03-28 LAB — C3 AND C4
C3 Complement: 148 mg/dL (ref 83–193)
C4 Complement: 38 mg/dL (ref 15–57)

## 2022-03-28 LAB — ANTI-NUCLEAR AB-TITER (ANA TITER)
ANA TITER: 1:40 {titer} — ABNORMAL HIGH
ANA Titer 1: 1:80 {titer} — ABNORMAL HIGH

## 2022-03-28 LAB — ANA: Anti Nuclear Antibody (ANA): POSITIVE — AB

## 2022-03-28 LAB — SEDIMENTATION RATE: Sed Rate: 39 mm/h — ABNORMAL HIGH (ref 0–20)

## 2022-04-02 ENCOUNTER — Ambulatory Visit (INDEPENDENT_AMBULATORY_CARE_PROVIDER_SITE_OTHER): Payer: Medicaid Other | Admitting: Medical

## 2022-04-02 ENCOUNTER — Encounter: Payer: Self-pay | Admitting: Medical

## 2022-04-02 VITALS — BP 106/70 | HR 74 | Resp 18 | Ht 65.0 in | Wt 214.6 lb

## 2022-04-02 DIAGNOSIS — R5383 Other fatigue: Secondary | ICD-10-CM

## 2022-04-02 DIAGNOSIS — R739 Hyperglycemia, unspecified: Secondary | ICD-10-CM

## 2022-04-02 DIAGNOSIS — M79671 Pain in right foot: Secondary | ICD-10-CM

## 2022-04-02 LAB — VITAMIN B12: Vitamin B-12: 459 pg/mL (ref 211–911)

## 2022-04-02 LAB — CBC WITH DIFFERENTIAL/PLATELET
Basophils Absolute: 0.1 10*3/uL (ref 0.0–0.1)
Basophils Relative: 1.1 % (ref 0.0–3.0)
Eosinophils Absolute: 0.1 10*3/uL (ref 0.0–0.7)
Eosinophils Relative: 1.5 % (ref 0.0–5.0)
HCT: 36.9 % (ref 36.0–46.0)
Hemoglobin: 11.9 g/dL — ABNORMAL LOW (ref 12.0–15.0)
Lymphocytes Relative: 41.5 % (ref 12.0–46.0)
Lymphs Abs: 2.4 10*3/uL (ref 0.7–4.0)
MCHC: 32.3 g/dL (ref 30.0–36.0)
MCV: 80.3 fl (ref 78.0–100.0)
Monocytes Absolute: 0.3 10*3/uL (ref 0.1–1.0)
Monocytes Relative: 5.7 % (ref 3.0–12.0)
Neutro Abs: 2.9 10*3/uL (ref 1.4–7.7)
Neutrophils Relative %: 50.2 % (ref 43.0–77.0)
Platelets: 243 10*3/uL (ref 150.0–400.0)
RBC: 4.6 Mil/uL (ref 3.87–5.11)
RDW: 14.5 % (ref 11.5–15.5)
WBC: 5.7 10*3/uL (ref 4.0–10.5)

## 2022-04-02 LAB — IRON: Iron: 97 ug/dL (ref 42–145)

## 2022-04-02 LAB — TSH: TSH: 0.93 u[IU]/mL (ref 0.35–5.50)

## 2022-04-02 LAB — T4, FREE: Free T4: 0.86 ng/dL (ref 0.60–1.60)

## 2022-04-02 NOTE — Addendum Note (Signed)
Addended by: Anabel Halon on: 04/02/2022 12:32 PM   Modules accepted: Orders

## 2022-04-02 NOTE — Patient Instructions (Addendum)
1. Fatigue, unspecified type Will do below labs to see if can find cause. Young mother 2 kids common for fatigue but look for contributing factors. - TSH - T4, free - B12 - Vitamin B1 - Iron  -cbc  2. Discussed your current weight and overall appears you are in your historical range/no wt loss. But do recommend overall healthy diet and avoid sugar. On review sugar average has been in prediabetic range.  Discussed your question on normal bowel pattern. Your bm pattern appears normal. Recommend daily exercise, hydrate well and high fiber diet.  Follow up date to be determined after lab review.

## 2022-04-02 NOTE — Progress Notes (Addendum)
Subjective:    Patient ID: Melanie Cruz, female    DOB: 15-Feb-1995, 27 y.o.   MRN: [id number]  HPI Pt in today for weight loss concerns.  6 days ago wt was 212 at rheumatologist per pt report but they recorded 214 lb. Today pt weight is 214 lb.    03-04-22 wt was 214 lb.   Pt states yesterday her weight at home was 213 lb.    Pt has 13 month old child and she states after birth of child she lost weight down to 214 lb. She states when pregnant max weight may have been 228 lb. Child on formula. She breast for 3-4 months.   Back in January 25, 2022 weight was 217 lb day she got nexplanon.   Pt states trying to eat healthy. She has cut out sugary drinks. Pt used to drinks juices and 3 sodas a day.   Pt is fatigued. She notes has 2 children. On review mild elevated blood sugar but A1c indicates not diabetic.     Review of Systems  Constitutional:  Positive for fatigue. Negative for chills and fever.  HENT:  Negative for congestion, drooling and ear pain.   Respiratory:  Negative for cough, chest tightness, shortness of breath and wheezing.   Cardiovascular:  Negative for chest pain and palpitations.  Gastrointestinal:  Negative for abdominal pain, blood in stool, constipation and nausea.  Genitourinary:  Negative for dysuria, flank pain and frequency.  Musculoskeletal:  Negative for back pain, joint swelling and neck pain.       Rt foot pain at times when walking.  Skin:  Negative for pallor and rash.  Neurological:  Negative for dizziness, speech difficulty, numbness and headaches.  Psychiatric/Behavioral:  Negative for behavioral problems, decreased concentration, dysphoric mood and suicidal ideas. The patient is not nervous/anxious.     Past Medical History:  Diagnosis Date   COVID-19    Gestational diabetes mellitus (GDM), antepartum 02/21/2021   Migraine      Social History   Socioeconomic History   Marital status: Single    Spouse name: Not on file   Number of  children: Not on file   Years of education: Not on file   Highest education level: Not on file  Occupational History   Not on file  Tobacco Use   Smoking status: Every Day    Packs/day: 0.10    Years: 5.00    Additional pack years: 0.00    Total pack years: 0.50    Types: Cigarettes    Passive exposure: Past   Smokeless tobacco: Never   Tobacco comments:    2 cig daily  Vaping Use   Vaping Use: Former   Substances: Flavoring  Substance and Sexual Activity   Alcohol use: Not Currently   Drug use: Not Currently    Types: Marijuana    Comment: occasional-last use 05/23/2017   Sexual activity: Yes    Birth control/protection: Inserts    Comment: nuvaring   Other Topics Concern   Not on file  Social History Narrative   Lives with roommate   Right handed   Caffeine: 0-1 cup/day   Social Determinants of Health   Financial Resource Strain: Not on file  Food Insecurity: No Food Insecurity (07/13/2021)   Hunger Vital Sign    Worried About Running Out of Food in the Last Year: Never true    Ran Out of Food in the Last Year: Never true  Transportation Needs: No Transportation Needs (  07/13/2021)   PRAPARE - Hydrologist (Medical): No    Lack of Transportation (Non-Medical): No  Physical Activity: Not on file  Stress: Not on file  Social Connections: Not on file  Intimate Partner Violence: Not on file    Past Surgical History:  Procedure Laterality Date   NO PAST SURGERIES      Family History  Problem Relation Age of Onset   Hypertension Mother    Diabetes Mother    Breast cancer Paternal Aunt        age at onset 21's   Migraines Neg Hx     Allergies  Allergen Reactions   Azithromycin Anaphylaxis   Cefdinir Other (See Comments)    Had sensation in throat after taking, was getting over a cold at the time.   Fluconazole Rash    Current Outpatient Medications on File Prior to Visit  Medication Sig Dispense Refill   benzonatate  (TESSALON) 100 MG capsule Take 1 capsule (100 mg total) by mouth 3 (three) times daily as needed for cough. (Patient not taking: Reported on 01/25/2022) 30 capsule 0   busPIRone (BUSPAR) 5 MG tablet Take 5 mg by mouth 3 (three) times daily.     famotidine (PEPCID) 20 MG tablet TAKE 1 TABLET(20 MG) BY MOUTH DAILY (Patient not taking: Reported on 03/04/2022) 90 tablet 2   fluticasone (FLONASE) 50 MCG/ACT nasal spray SHAKE LIQUID AND USE 2 SPRAYS IN EACH NOSTRIL DAILY 48 g 2   levocetirizine (XYZAL) 5 MG tablet Take 1 tablet (5 mg total) by mouth every evening. 30 tablet 3   metFORMIN (GLUCOPHAGE) 500 MG tablet Take 1 tablet (500 mg total) by mouth 2 (two) times daily with a meal. 180 tablet 3   terconazole (TERAZOL 3) 0.8 % vaginal cream Place 1 applicator vaginally at bedtime. Apply nightly for three nights. (Patient not taking: Reported on 03/04/2022) 20 g 0   No current facility-administered medications on file prior to visit.    BP 106/70   Pulse 74   Resp 18   Ht 5\' 5"  (1.651 m)   Wt 214 lb 9.6 oz (97.3 kg)   SpO2 96%   BMI 35.71 kg/m        Objective:   Physical Exam  General Mental Status- Alert. General Appearance- Not in acute distress.   Skin General: Color- Normal Color. Moisture- Normal Moisture.  Neck Carotid Arteries- Normal color. Moisture- Normal Moisture. No carotid bruits. No JVD.  Chest and Lung Exam Auscultation: Breath Sounds:-Normal.  Cardiovascular Auscultation:Rythm- Regular. Murmurs & Other Heart Sounds:Auscultation of the heart reveals- No Murmurs.  Abdomen Inspection:-Inspeection Normal. Palpation/Percussion:Note:No mass. Palpation and Percussion of the abdomen reveal- Non Tender, Non Distended + BS, no rebound or guarding.   Neurologic Cranial Nerve exam:- CN III-XII intact(No nystagmus), symmetric smile. Strength:- 5/5 equal and symmetric strength both upper and lower extremities.       Assessment & Plan:   Patient Instructions  1.  Fatigue, unspecified type Will do below labs to see if can find cause. Young mother 2 kids common for fatigue but look for contributing factors. - TSH - T4, free - B12 - Vitamin B1 - Iron  -cbc  2. Discussed your current weight and overall appears you are in your historical range/no wt loss. But do recommend overall healthy diet and avoid sugar. On review sugar average has been in prediabetic range.  Discussed your question on normal bowel pattern. Your bm pattern appears normal. Recommend daily  exercise, hydrate well and high fiber diet.  Follow up date to be determined after lab review.    Mackie Pai, PA-C

## 2022-04-05 LAB — VITAMIN B1: Vitamin B1 (Thiamine): 10 nmol/L (ref 8–30)

## 2022-04-08 ENCOUNTER — Telehealth: Payer: Self-pay | Admitting: *Deleted

## 2022-04-08 NOTE — Telephone Encounter (Signed)
Patient states she would like to know based on her lab results if she needs to schedule a follow up appointment. Please advise.

## 2022-04-09 ENCOUNTER — Ambulatory Visit (HOSPITAL_BASED_OUTPATIENT_CLINIC_OR_DEPARTMENT_OTHER)
Admission: RE | Admit: 2022-04-09 | Discharge: 2022-04-09 | Disposition: A | Discharge status: Home / Self Care | Payer: Medicaid Other | Source: Ambulatory Visit | Attending: Medical | Admitting: Medical

## 2022-04-09 DIAGNOSIS — M79671 Pain in right foot: Secondary | ICD-10-CM | POA: Diagnosis present

## 2022-04-09 NOTE — Telephone Encounter (Signed)
Patient advised ANA test remains positive but at a low level. Her sedimentation rate also remains elevated but at a very low level. With the results staying the same Dr. Benjamine Mola does not see any concern about lupus or other inflammatory problem developing. Dr. Benjamine Mola thinks we can follow up as needed if she starts to have more problems like joint swelling or rashes, but no scheduled f/u needed. Patient verbalized understanding.

## 2022-04-09 NOTE — Telephone Encounter (Signed)
ANA test remains positive but at a low level. Her sedimentation rate also remains elevated but at a very low level. With the results staying the same I don't see any concern about lupus or other inflammatory problem developing. I think we can follow up as needed if she starts to have more problems like joint swelling or rashes, but no scheduled f/u needed.

## 2022-04-12 ENCOUNTER — Encounter: Payer: Self-pay | Admitting: Medical

## 2022-04-13 NOTE — Addendum Note (Signed)
Addended by: Gwenevere Abbot on: 04/13/2022 08:05 AM   Modules accepted: Orders

## 2022-04-19 ENCOUNTER — Encounter: Payer: Self-pay | Admitting: Medical

## 2022-04-22 ENCOUNTER — Encounter: Payer: Self-pay | Admitting: *Deleted

## 2022-04-22 ENCOUNTER — Ambulatory Visit: Payer: Medicaid Other | Admitting: Medical

## 2022-04-24 ENCOUNTER — Ambulatory Visit: Payer: Medicaid Other | Admitting: Podiatry

## 2022-04-25 ENCOUNTER — Telehealth: Payer: Medicaid Other | Admitting: Medical

## 2022-04-26 ENCOUNTER — Ambulatory Visit (INDEPENDENT_AMBULATORY_CARE_PROVIDER_SITE_OTHER): Payer: Medicaid Other | Admitting: Medical

## 2022-04-26 VITALS — BP 110/68 | HR 84 | Temp 98.0°F | Resp 18 | Ht 65.0 in | Wt 212.0 lb

## 2022-04-26 DIAGNOSIS — R1011 Right upper quadrant pain: Secondary | ICD-10-CM

## 2022-04-26 DIAGNOSIS — R109 Unspecified abdominal pain: Secondary | ICD-10-CM

## 2022-04-26 LAB — COMPREHENSIVE METABOLIC PANEL
ALT: 14 U/L (ref 0–35)
AST: 14 U/L (ref 0–37)
Albumin: 4.3 g/dL (ref 3.5–5.2)
Alkaline Phosphatase: 42 U/L (ref 39–117)
BUN: 10 mg/dL (ref 6–23)
CO2: 22 mEq/L (ref 19–32)
Calcium: 8.8 mg/dL (ref 8.4–10.5)
Chloride: 108 mEq/L (ref 96–112)
Creatinine, Ser: 0.68 mg/dL (ref 0.40–1.20)
GFR: 117.9 mL/min (ref >60.00)
Glucose, Bld: 107 mg/dL — ABNORMAL HIGH (ref 70–99)
Potassium: 3.9 mEq/L (ref 3.5–5.1)
Sodium: 139 mEq/L (ref 135–145)
Total Bilirubin: 0.3 mg/dL (ref 0.2–1.2)
Total Protein: 7.1 g/dL (ref 6.0–8.3)

## 2022-04-26 LAB — CBC WITH DIFFERENTIAL/PLATELET
Basophils Absolute: 0.1 10*3/uL (ref 0.0–0.1)
Basophils Relative: 0.9 % (ref 0.0–3.0)
Eosinophils Absolute: 0.1 10*3/uL (ref 0.0–0.7)
Eosinophils Relative: 1.3 % (ref 0.0–5.0)
HCT: 38.4 % (ref 36.0–46.0)
Hemoglobin: 12.3 g/dL (ref 12.0–15.0)
Lymphocytes Relative: 37.1 % (ref 12.0–46.0)
Lymphs Abs: 2.4 10*3/uL (ref 0.7–4.0)
MCHC: 32 g/dL (ref 30.0–36.0)
MCV: 81.2 fl (ref 78.0–100.0)
Monocytes Absolute: 0.4 10*3/uL (ref 0.1–1.0)
Monocytes Relative: 5.4 % (ref 3.0–12.0)
Neutro Abs: 3.6 10*3/uL (ref 1.4–7.7)
Neutrophils Relative %: 55.3 % (ref 43.0–77.0)
Platelets: 211 10*3/uL (ref 150.0–400.0)
RBC: 4.74 Mil/uL (ref 3.87–5.11)
RDW: 15 % (ref 11.5–15.5)
WBC: 6.6 10*3/uL (ref 4.0–10.5)

## 2022-04-26 LAB — LIPASE: Lipase: 24 U/L (ref 11.0–59.0)

## 2022-04-26 NOTE — Patient Instructions (Addendum)
1. Acute right flank pain One event the other night and symptoms resolved. No asosciated uti symptoms. Think worth while to see if blood or early infection indicators present. Poct ua. Future test when not on menses. Next week. Do sooner if flank pain returns.   2. Right upper quadrant abdominal pain One brief event the other day. - CBC w/Diff - Comp Met (CMET) - Lipase   Both event rare and transient and not associated. Recommend getting above labs and will update you on test results. Ask you update if signs/symptoms return as this would be helpful.  Follow up date to be determined after lab review as well on update if signs/symptoms reoccur.

## 2022-04-26 NOTE — Progress Notes (Signed)
Subjective:    Patient ID: Melanie Cruz, female    DOB: November 20, 1995, 28 y.o.   MRN: [id number]  HPI  Pt in for rt flank pain. She states 2 nights ago she was sleeping. She states felt in her sleep and then went away. Pain not severe enough to wake her up. No uti type signs or symptoms.No nausea, no vomtiing or constipation.  On review pt state her son and husband and pt had loose stools pt as well but every one is getting better.  During exam and palpation of abdomen she mentioned some recent faint ruq pain for seconds. She can't remember if occures after eating. But no ruq pain presently.    Review of Systems  Constitutional:  Negative for chills, fatigue and fever.  Respiratory:  Negative for cough, chest tightness, shortness of breath and wheezing.   Cardiovascular:  Negative for chest pain.  Gastrointestinal:  Positive for abdominal pain. Negative for abdominal distention, blood in stool, diarrhea, nausea and vomiting.  Genitourinary:  Negative for dyspareunia, enuresis, flank pain and frequency.  Musculoskeletal:  Negative for back pain.  Skin:  Negative for rash.  Neurological:  Negative for dizziness, facial asymmetry and light-headedness.  Hematological:  Negative for adenopathy. Does not bruise/bleed easily.  Psychiatric/Behavioral:  Negative for behavioral problems, confusion, decreased concentration, dysphoric mood and sleep disturbance. The patient is not nervous/anxious.     Past Medical History:  Diagnosis Date   COVID-19    Gestational diabetes mellitus (GDM), antepartum 02/21/2021   Migraine      Social History   Socioeconomic History   Marital status: Single    Spouse name: Not on file   Number of children: Not on file   Years of education: Not on file   Highest education level: Not on file  Occupational History   Not on file  Tobacco Use   Smoking status: Every Day    Packs/day: 0.10    Years: 5.00    Additional pack years: 0.00    Total pack  years: 0.50    Types: Cigarettes    Passive exposure: Past   Smokeless tobacco: Never   Tobacco comments:    2 cig daily  Vaping Use   Vaping Use: Former   Substances: Flavoring  Substance and Sexual Activity   Alcohol use: Not Currently   Drug use: Not Currently    Types: Marijuana    Comment: occasional-last use 05/23/2017   Sexual activity: Yes    Birth control/protection: Inserts    Comment: nuvaring   Other Topics Concern   Not on file  Social History Narrative   Lives with roommate   Right handed   Caffeine: 0-1 cup/day   Social Determinants of Health   Financial Resource Strain: Not on file  Food Insecurity: No Food Insecurity (07/13/2021)   Hunger Vital Sign    Worried About Running Out of Food in the Last Year: Never true    Ran Out of Food in the Last Year: Never true  Transportation Needs: No Transportation Needs (07/13/2021)   PRAPARE - Administrator, Civil Service (Medical): No    Lack of Transportation (Non-Medical): No  Physical Activity: Not on file  Stress: Not on file  Social Connections: Not on file  Intimate Partner Violence: Not on file    Past Surgical History:  Procedure Laterality Date   NO PAST SURGERIES      Family History  Problem Relation Age of Onset   Hypertension  Mother    Diabetes Mother    Breast cancer Paternal Aunt        age at onset 36's   Migraines Neg Hx     Allergies  Allergen Reactions   Azithromycin Anaphylaxis   Cefdinir Other (See Comments)    Had sensation in throat after taking, was getting over a cold at the time.   Fluconazole Rash    Current Outpatient Medications on File Prior to Visit  Medication Sig Dispense Refill   busPIRone (BUSPAR) 5 MG tablet Take 5 mg by mouth 3 (three) times daily.     fluticasone (FLONASE) 50 MCG/ACT nasal spray SHAKE LIQUID AND USE 2 SPRAYS IN EACH NOSTRIL DAILY 48 g 2   levocetirizine (XYZAL) 5 MG tablet Take 1 tablet (5 mg total) by mouth every evening. 30 tablet  3   metFORMIN (GLUCOPHAGE) 500 MG tablet Take 1 tablet (500 mg total) by mouth 2 (two) times daily with a meal. 180 tablet 3   No current facility-administered medications on file prior to visit.    BP 110/68   Pulse 84   Temp 98 F (36.7 C)   Resp 18   Ht  (1.651 m)   Wt 212 lb (96.2 kg)   LMP 04/19/2022   SpO2 99%   BMI 35.28 kg/m        Objective:   Physical Exam  General Mental Status- Alert. General Appearance- Not in acute distress.   Skin General: Color- Normal Color. Moisture- Normal Moisture.  Neck Carotid Arteries- Normal color. Moisture- Normal Moisture. No carotid bruits. No JVD.  Chest and Lung Exam Auscultation: Breath Sounds:-Normal.  Cardiovascular Auscultation:Rythm- Regular. Murmurs & Other Heart Sounds:Auscultation of the heart reveals- No Murmurs.  Abdomen Inspection:-Inspeection Normal. Palpation/Percussion:Note:No mass. Palpation and Percussion of the abdomen reveal- Non Tender, Non Distended + BS, no rebound or guarding.   Neurologic Cranial Nerve exam:- CN III-XII intact(No nystagmus), symmetric smile. Strength:- 5/5 equal and symmetric strength both upper and lower extremities.   Back- no cva tendernss.    Assessment & Plan:   Patient Instructions  1. Acute right flank pain One event the other night and symptoms resolved. No asosciated uti symptoms. Think worth while to see if blood or early infection indicators present. Poct ua. Future test when not on menses. Next week. Do sooner if flank pain returns.   2. Right upper quadrant abdominal pain One brief event the other day. - CBC w/Diff - Comp Met (CMET) - Lipase   Both event rare and transient and not associated. Recommend getting above labs and will update you on test results. Ask you update if signs/symptoms return as this would be helpful.  Follow up date to be determined after lab review as well on update if signs/symptoms reoccur.   Esperanza Richters, PA-C

## 2022-05-01 ENCOUNTER — Other Ambulatory Visit (INDEPENDENT_AMBULATORY_CARE_PROVIDER_SITE_OTHER): Payer: Medicaid Other

## 2022-05-01 DIAGNOSIS — R109 Unspecified abdominal pain: Secondary | ICD-10-CM | POA: Diagnosis not present

## 2022-05-01 DIAGNOSIS — R8271 Bacteriuria: Secondary | ICD-10-CM

## 2022-05-01 DIAGNOSIS — R82998 Other abnormal findings in urine: Secondary | ICD-10-CM

## 2022-05-01 LAB — POC URINALSYSI DIPSTICK (AUTOMATED)
Blood, UA: NEGATIVE
Glucose, UA: NEGATIVE
Ketones, UA: NEGATIVE
Nitrite, UA: POSITIVE
Protein, UA: NEGATIVE
Spec Grav, UA: 1.02 (ref 1.010–1.025)
Urobilinogen, UA: 0.2 E.U./dL
pH, UA: 6 (ref 5.0–8.0)

## 2022-05-02 LAB — URINE CULTURE
MICRO NUMBER:: 14867812
Result:: NO GROWTH
SPECIMEN QUALITY:: ADEQUATE

## 2022-05-03 ENCOUNTER — Encounter: Payer: Self-pay | Admitting: Medical

## 2022-05-08 ENCOUNTER — Ambulatory Visit: Payer: Medicaid Other | Admitting: Podiatry

## 2022-05-09 ENCOUNTER — Ambulatory Visit: Payer: Medicaid Other

## 2022-05-09 ENCOUNTER — Other Ambulatory Visit (HOSPITAL_COMMUNITY)
Admission: RE | Admit: 2022-05-09 | Discharge: 2022-05-09 | Disposition: A | Discharge status: Home / Self Care | Payer: Medicaid Other | Source: Ambulatory Visit | Attending: Obstetrics and Gynecology | Admitting: Obstetrics and Gynecology

## 2022-05-09 VITALS — BP 113/60 | HR 82 | Wt 210.0 lb

## 2022-05-09 DIAGNOSIS — Z113 Encounter for screening for infections with a predominantly sexual mode of transmission: Secondary | ICD-10-CM | POA: Diagnosis present

## 2022-05-09 DIAGNOSIS — N898 Other specified noninflammatory disorders of vagina: Secondary | ICD-10-CM

## 2022-05-09 NOTE — Progress Notes (Signed)
Patient complaining of vaginal odor that comes and goes. Patient request for STD testing and bloodwork today. Armandina Stammer RN

## 2022-05-10 ENCOUNTER — Encounter: Payer: Self-pay | Admitting: Medical

## 2022-05-10 LAB — HEPATITIS B SURFACE ANTIGEN: Hepatitis B Surface Ag: NEGATIVE

## 2022-05-10 LAB — CERVICOVAGINAL ANCILLARY ONLY
Bacterial Vaginitis (gardnerella): POSITIVE — AB
Candida Glabrata: NEGATIVE
Candida Vaginitis: NEGATIVE
Chlamydia: NEGATIVE
Comment: NEGATIVE
Comment: NEGATIVE
Comment: NEGATIVE
Comment: NEGATIVE
Comment: NEGATIVE
Comment: NORMAL
Neisseria Gonorrhea: NEGATIVE
Trichomonas: NEGATIVE

## 2022-05-10 LAB — RPR: RPR Ser Ql: NONREACTIVE

## 2022-05-10 LAB — HIV ANTIBODY (ROUTINE TESTING W REFLEX): HIV Screen 4th Generation wRfx: NONREACTIVE

## 2022-05-10 LAB — HEPATITIS C ANTIBODY: Hep C Virus Ab: NONREACTIVE

## 2022-05-10 MED ORDER — BUSPIRONE HCL 10 MG PO TABS: 10.0000 mg | ORAL_TABLET | Freq: Two times a day (BID) | ORAL | 0 refills | Status: AC

## 2022-05-10 NOTE — Addendum Note (Signed)
Addended by: Gwenevere Abbot on: 05/10/2022 08:39 PM   Modules accepted: Orders

## 2022-05-13 ENCOUNTER — Telehealth: Payer: Self-pay

## 2022-05-13 DIAGNOSIS — B9689 Other specified bacterial agents as the cause of diseases classified elsewhere: Secondary | ICD-10-CM

## 2022-05-13 MED ORDER — METRONIDAZOLE 500 MG PO TABS: 500.0000 mg | ORAL_TABLET | Freq: Two times a day (BID) | ORAL | 0 refills | Status: DC

## 2022-05-13 NOTE — Telephone Encounter (Signed)
Patient called requesting a Rx for BV. Flagyl 500 mg take 1 tablet PO BID x 7 days was sent to her pharmacy. Understanding was voiced. Melanie Cruz, CMA

## 2022-05-16 ENCOUNTER — Ambulatory Visit: Payer: Medicaid Other | Admitting: Podiatry

## 2022-05-23 ENCOUNTER — Ambulatory Visit (INDEPENDENT_AMBULATORY_CARE_PROVIDER_SITE_OTHER): Payer: Medicaid Other | Admitting: Podiatry

## 2022-05-23 ENCOUNTER — Encounter: Payer: Self-pay | Admitting: Podiatry

## 2022-05-23 DIAGNOSIS — M722 Plantar fascial fibromatosis: Secondary | ICD-10-CM

## 2022-05-23 MED ORDER — MELOXICAM 15 MG PO TABS
15.0000 mg | ORAL_TABLET | Freq: Every day | ORAL | 3 refills | Status: DC
Start: 2022-05-23 — End: 2022-05-23

## 2022-05-23 MED ORDER — MELOXICAM 15 MG PO TABS: 15.0000 mg | ORAL_TABLET | Freq: Every day | ORAL | 3 refills | Status: AC

## 2022-05-23 NOTE — Patient Instructions (Addendum)

## 2022-05-23 NOTE — Progress Notes (Signed)
  Subjective:  Patient ID: Melanie Cruz, female    DOB: 1995/09/29,  MRN: [id number]  Chief Complaint  Patient presents with   Foot Pain    Foot pain, right Referring Provider: Esperanza Richters    27 y.o. female presents with the above complaint. History confirmed with patient.  Pain is worse in the morning on the bottom of the heel  Objective:  Physical Exam: warm, good capillary refill, no trophic changes or ulcerative lesions, normal DP and PT pulses, and normal sensory exam. Left Foot: normal exam, no swelling, tenderness, instability; ligaments intact, full range of motion of all ankle/foot joints Right Foot: point tenderness over the heel pad and gastrocnemius equinus is noted with a positive silverskiold test  No images are attached to the encounter.  Radiographs: Multiple views x-ray of the right foot: no fracture, dislocation, swelling or degenerative changes noted, posterior calcaneal spur, and Haglund deformity noted Assessment:   1. Plantar fasciitis of right foot      Plan:  Patient was evaluated and treated and all questions answered.  Discussed the etiology and treatment options for plantar fasciitis including stretching, formal physical therapy, supportive shoegears such as a running shoe or sneaker, pre fabricated orthoses, injection therapy, and oral medications. We also discussed the role of surgical treatment of this for patients who do not improve after exhausting non-surgical treatment options.   -XR reviewed with patient -Educated patient on stretching and icing of the affected limb -Injection delivered to the plantar fascia of the left foot. -Rx for meloxicam. Educated on use, risks and benefits of the medication  After sterile prep with povidone-iodine solution and alcohol, the left heel was injected with 0.5cc 2% xylocaine plain, 0.5cc 0.5% marcaine plain, 5mg  triamcinolone acetonide, and 2mg  dexamethasone was injected along the medial plantar fascia  at the insertion on the plantar calcaneus. The patient tolerated the procedure well without complication.  Return if symptoms worsen or fail to improve.

## 2022-06-04 ENCOUNTER — Telehealth: Payer: Self-pay

## 2022-06-04 NOTE — Telephone Encounter (Signed)
Pt sent follow up mychart message

## 2022-06-04 NOTE — Telephone Encounter (Signed)
Initial Comment Caller states she thinks she had an allergic reaction. She states she has a rash. She states she had a spider bite on yesterday. Translation No Nurse Assessment Nurse: Jayme Cloud, RN, Elmyra Ricks Date/Time Lamount Cohen Time): 06/01/2022 1:38:14 PM Confirm and document reason for call. If symptomatic, describe symptoms. ---Thinks she may be having an allergic reaction- she was on a medication for bacterial vaginitis and started with itching and tingling. Rash looks like burn skin with bubbling and blistering. Does the patient have any new or worsening symptoms? ---Yes Will a triage be completed? ---Yes Related visit to physician within the last 2 weeks? ---No Does the PT have any chronic conditions? (i.e. diabetes, asthma, this includes High risk factors for pregnancy, etc.) ---No Is the patient pregnant or possibly pregnant? (Ask all females between the ages of 15-55) ---No Is this a behavioral health or substance abuse call? ---No Guidelines Guideline Title Affirmed Question Affirmed Notes Nurse Date/Time (Eastern Time) Rash or Redness - Localized Mild localized rash Hulen Luster 06/01/2022 1:40:57 PM Disp. Time Lamount Cohen Time) Disposition Final User 06/01/2022 1:47:18 PM Home Care Yes Jayme Cloud, RN, Elmyra Ricks PLEASE NOTE: All timestamps contained within this report are represented as [place] Standard Time. CONFIDENTIALTY NOTICE: This fax transmission is intended only for the addressee. It contains information that is legally privileged, confidential or otherwise protected from use or disclosure. If you are not the intended recipient, you are strictly prohibited from reviewing, disclosing, copying using or disseminating any of this information or taking any action in reliance on or regarding this information. If you have received this fax in error, please notify us immediately by telephone so that we can arrange for its return to [place]. Phone: [phone], Toll-Free:  [phone], Fax: [phone] Page: 2 of 2 Call Id: [id number] Final Disposition 06/01/2022 1:47:18 PM Home Care Yes Jayme Cloud, RN, Rodney Cruise Disagree/Comply Comply Caller Understands Yes PreDisposition Call Doctor Care Advice Given Per Guideline HOME CARE: * You should be able to treat this at home. REASSURANCE AND EDUCATION - MILD LOCALIZED RASH: * New rashes that are in one small (localized) area are usually due to skin contact with an irritating substance. * Here is some care advice that should help. HYDROCORTISONE CREAM FOR ITCHING: * Put 1% hydrocortisone cream on the itchy area(s) 3 times a day. Use it for a couple days, until it feels better. This will help decrease the itching. * This is an over-the-counter (OTC) drug. You can buy it at the drugstore. EXPECTED COURSE: * Most of these rashes pass in 2 to 3 days. CALL BACK IF: * Rash spreads or becomes worse * Rash lasts over 1 week * You become worse Comments User: Christean Grief, RN Date/Time Lamount Cohen Time): 06/01/2022 1:41:17 PM Rash noted to interior R wrist. User: Christean Grief, RN Date/Time (Eastern Time): 06/01/2022 1:48:48 PM affected area is less than one inch in width and lenght, caller endorsed possible blistering that is starting to occur. Rash is not spreading, getting worse, or increased in redness. Caller denies burning, pain, or itching to area. Referrals REFERRED TO PCP OFFICE

## 2022-06-07 ENCOUNTER — Telehealth: Payer: Medicaid Other | Admitting: Medical

## 2022-06-14 ENCOUNTER — Encounter: Payer: Self-pay | Admitting: Medical

## 2022-06-18 ENCOUNTER — Ambulatory Visit: Payer: Medicaid Other

## 2022-06-18 ENCOUNTER — Other Ambulatory Visit (HOSPITAL_COMMUNITY)
Admission: RE | Admit: 2022-06-18 | Discharge: 2022-06-18 | Disposition: A | Discharge status: Home / Self Care | Payer: Medicaid Other | Source: Ambulatory Visit | Attending: Family Medicine | Admitting: Family Medicine

## 2022-06-18 VITALS — BP 113/60 | Wt 210.0 lb

## 2022-06-18 DIAGNOSIS — N898 Other specified noninflammatory disorders of vagina: Secondary | ICD-10-CM | POA: Insufficient documentation

## 2022-06-18 NOTE — Progress Notes (Signed)
Patient presents for ongoing vaginal discharge. Patient states that last time she was treated for BV she didn't finish the regimen. Would like to be tested again since she had a light brown discharge. Will send self swab and treat based off results. Armandina Stammer RN

## 2022-06-20 LAB — CERVICOVAGINAL ANCILLARY ONLY
Bacterial Vaginitis (gardnerella): POSITIVE — AB
Candida Glabrata: NEGATIVE
Candida Vaginitis: NEGATIVE
Chlamydia: NEGATIVE
Comment: NEGATIVE
Comment: NEGATIVE
Comment: NEGATIVE
Comment: NEGATIVE
Comment: NEGATIVE
Comment: NORMAL
Neisseria Gonorrhea: NEGATIVE
Trichomonas: NEGATIVE

## 2022-06-24 MED ORDER — METRONIDAZOLE 1.3 % VA GEL: 1.0000 | Freq: Every day | VAGINAL | 0 refills | Status: AC

## 2022-06-24 NOTE — Addendum Note (Signed)
Addended by: Levie Heritage on: 06/24/2022 11:13 AM   Modules accepted: Orders

## 2022-07-08 ENCOUNTER — Encounter: Payer: Self-pay | Admitting: Family Medicine

## 2022-07-08 ENCOUNTER — Ambulatory Visit (INDEPENDENT_AMBULATORY_CARE_PROVIDER_SITE_OTHER): Payer: Medicaid Other | Admitting: Family Medicine

## 2022-07-08 VITALS — BP 133/85 | HR 94 | Ht 65.0 in | Wt 207.0 lb

## 2022-07-08 DIAGNOSIS — M79641 Pain in right hand: Secondary | ICD-10-CM

## 2022-07-08 DIAGNOSIS — M79642 Pain in left hand: Secondary | ICD-10-CM | POA: Diagnosis not present

## 2022-07-08 MED ORDER — DICLOFENAC SODIUM 1 % EX GEL: 2.0000 g | Freq: Four times a day (QID) | CUTANEOUS | 1 refills | Status: DC

## 2022-07-08 NOTE — Progress Notes (Signed)
Acute Office Visit  Subjective:     Patient ID: Drake Leach, female    DOB: [birth year], 29 y.o.   MRN: [id number]  Chief Complaint  Patient presents with   Hand Pain     Patient is in today for bilateral hand pain.   Discussed the use of AI scribe software for clinical note transcription with the patient, who gave verbal consent to proceed.  History of Present Illness   The patient, who recently started a new job at [employer], presents with bilateral hand pain and numbness. Their job involves a lot of gripping, pulling, and pushing, which they believe is the cause of their symptoms. They describe the pain as sharp, radiating from the palms of their hands and sometimes extending up their arms. They also report numbness and tingling in their fingers, which is particularly noticeable when writing. The patient has tried using a carpal tunnel brace, but it did not provide significant relief. They have not been sleeping in the brace. The patient also reports muscle fatigue, which they attribute to the physical demands of their new job. They have a history of a foot spur, for which they were prescribed meloxicam, but they have not been taking it regularly.          ROS All review of systems negative except what is listed in the HPI      Objective:    BP 133/85   Pulse 94   Ht 5\' 5"  (1.651 m)   Wt 207 lb (93.9 kg)   LMP 05/17/2022 Comment: spotting - irregular on Nexplanon  SpO2 100%   BMI 34.45 kg/m    Physical Exam Vitals reviewed.  Constitutional:      Appearance: Normal appearance.  Musculoskeletal:        General: No swelling or tenderness. Normal range of motion.     Comments: Negative Tinel's, mild symptoms to right hand with Phalen's  Skin:    General: Skin is warm and dry.     Findings: No bruising or erythema.  Neurological:     General: No focal deficit present.     Mental Status: She is alert and oriented to person, place, and time. Mental  status is at baseline.  Psychiatric:        Mood and Affect: Mood normal.        Behavior: Behavior normal.        Thought Content: Thought content normal.        Judgment: Judgment normal.     No results found for any visits on 07/08/22.      Assessment & Plan:   Problem List Items Addressed This Visit   None Visit Diagnoses     Pain in both hands    -  Primary   Relevant Medications   diclofenac Sodium (VOLTAREN) 1 % GEL     Start taking the Meloxicam daily for the next 2 weeks then switch to as needed if making good progress Can try Voltaren gel as needed, heat, ice, massage, stretches If not making good progress we can go to sports medicine/ortho  Meds ordered this encounter  Medications   diclofenac Sodium (VOLTAREN) 1 % GEL    Sig: Apply 2 g topically 4 (four) times daily.    Dispense:  50 g    Refill:  1    Order Specific Question:   Supervising Provider    Answer:   Danise Edge A [[id number]]    Return if symptoms worsen  or fail to improve.  Terrilyn Saver, NP

## 2022-07-08 NOTE — Patient Instructions (Addendum)
Start taking the Meloxicam daily for the next 2 weeks then switch to as needed if making good progress Can try Voltaren gel as needed, heat, ice, massage, stretches If not making good progress we can go to sports medicine/ortho

## 2022-07-23 ENCOUNTER — Ambulatory Visit (INDEPENDENT_AMBULATORY_CARE_PROVIDER_SITE_OTHER): Payer: Medicaid Other

## 2022-07-23 ENCOUNTER — Other Ambulatory Visit (HOSPITAL_COMMUNITY)
Admission: RE | Admit: 2022-07-23 | Discharge: 2022-07-23 | Disposition: A | Discharge status: Home / Self Care | Payer: Medicaid Other | Source: Ambulatory Visit | Attending: Family Medicine | Admitting: Family Medicine

## 2022-07-23 VITALS — BP 113/70 | HR 80

## 2022-07-23 DIAGNOSIS — N898 Other specified noninflammatory disorders of vagina: Secondary | ICD-10-CM | POA: Diagnosis present

## 2022-07-23 MED ORDER — METRONIDAZOLE 1 % EX GEL: Freq: Every day | CUTANEOUS | 0 refills | Status: DC

## 2022-07-23 NOTE — Progress Notes (Signed)
Patient states she recently had a new partner. Patient states she think its BV due to having a vaginal odor. Patient states that the metrogel helped her last time.  Self swab obtained and will be sent patient desires to have GC/chl collected. Denies any blood STD testing at this time. Armandina Stammer RN

## 2022-07-25 ENCOUNTER — Telehealth: Payer: Self-pay

## 2022-07-25 LAB — CERVICOVAGINAL ANCILLARY ONLY
Bacterial Vaginitis (gardnerella): POSITIVE — AB
Candida Glabrata: NEGATIVE
Candida Vaginitis: NEGATIVE
Chlamydia: NEGATIVE
Comment: NEGATIVE
Comment: NEGATIVE
Comment: NEGATIVE
Comment: NEGATIVE
Comment: NEGATIVE
Comment: NORMAL
Neisseria Gonorrhea: NEGATIVE
Trichomonas: NEGATIVE

## 2022-07-25 MED ORDER — METRONIDAZOLE 1.3 % VA GEL: 1.0000 | Freq: Every day | VAGINAL | 0 refills | Status: AC

## 2022-07-25 NOTE — Telephone Encounter (Signed)
Patient presented for nurse visit and positive for BV. Patient wanting to use the one time treatment for BV that you prescribed last time. Armandina Stammer RN

## 2022-08-06 ENCOUNTER — Encounter: Payer: Self-pay | Admitting: Medical

## 2022-08-06 ENCOUNTER — Other Ambulatory Visit (HOSPITAL_COMMUNITY)
Admission: RE | Admit: 2022-08-06 | Discharge: 2022-08-06 | Disposition: A | Discharge status: Home / Self Care | Payer: Medicaid Other | Source: Ambulatory Visit | Attending: Family Medicine | Admitting: Family Medicine

## 2022-08-06 ENCOUNTER — Ambulatory Visit (INDEPENDENT_AMBULATORY_CARE_PROVIDER_SITE_OTHER): Payer: Medicaid Other

## 2022-08-06 VITALS — BP 108/53 | HR 67 | Wt 200.0 lb

## 2022-08-06 DIAGNOSIS — N898 Other specified noninflammatory disorders of vagina: Secondary | ICD-10-CM

## 2022-08-06 DIAGNOSIS — R350 Frequency of micturition: Secondary | ICD-10-CM | POA: Diagnosis not present

## 2022-08-06 LAB — POCT URINALYSIS DIPSTICK
Bilirubin, UA: NEGATIVE
Glucose, UA: NEGATIVE
Ketones, UA: NEGATIVE
Nitrite, UA: NEGATIVE
Protein, UA: POSITIVE — AB
Spec Grav, UA: 1.015 (ref 1.010–1.025)
Urobilinogen, UA: 0.2 E.U./dL
pH, UA: 7 (ref 5.0–8.0)

## 2022-08-06 NOTE — Progress Notes (Signed)
SUBJECTIVE:  27 y.o. female complains of clear vaginal discharge for 1 week(s). Denies abnormal vaginal bleeding or significant pelvic pain or fever. Patient states she has a new partner.   No LMP recorded. Patient has had an implant.  OBJECTIVE:  She appears well, afebrile. Urine dipstick: positive for leukocytes.  ASSESSMENT:  Vaginal Discharge  Vaginal Odor   PLAN:  GC, chlamydia, trichomonas, BVAG, CVAG probe sent to lab. Treatment: To be determined once lab results are received ROV prn if symptoms persist or worsen.   Gabrianna Fassnacht l Rendy Lazard, CMA

## 2022-08-08 ENCOUNTER — Other Ambulatory Visit: Payer: Self-pay

## 2022-08-08 MED ORDER — METRONIDAZOLE 500 MG PO TABS: 500.0000 mg | ORAL_TABLET | Freq: Two times a day (BID) | ORAL | 0 refills | Status: DC

## 2022-08-08 NOTE — Progress Notes (Signed)
Patient made aware of BV results. Prescription for flagyl sent to pharmacy. Armandina Stammer RN

## 2022-08-09 ENCOUNTER — Other Ambulatory Visit: Payer: Self-pay | Admitting: Medical

## 2022-08-12 ENCOUNTER — Ambulatory Visit: Payer: Medicaid Other | Admitting: Medical

## 2022-08-13 ENCOUNTER — Other Ambulatory Visit: Payer: Self-pay | Admitting: Medical

## 2022-08-13 DIAGNOSIS — N3000 Acute cystitis without hematuria: Secondary | ICD-10-CM

## 2022-08-13 MED ORDER — NITROFURANTOIN MONOHYD MACRO 100 MG PO CAPS
100.0000 mg | ORAL_CAPSULE | Freq: Two times a day (BID) | ORAL | 0 refills | Status: DC
Start: 2022-08-13 — End: 2022-11-07

## 2022-08-20 ENCOUNTER — Ambulatory Visit (INDEPENDENT_AMBULATORY_CARE_PROVIDER_SITE_OTHER): Payer: Medicaid Other | Admitting: Medical

## 2022-08-20 VITALS — BP 122/65 | HR 87 | Temp 98.0°F | Resp 16 | Ht 65.0 in | Wt 195.6 lb

## 2022-08-20 DIAGNOSIS — R5383 Other fatigue: Secondary | ICD-10-CM | POA: Diagnosis not present

## 2022-08-20 DIAGNOSIS — R634 Abnormal weight loss: Secondary | ICD-10-CM | POA: Diagnosis not present

## 2022-08-20 DIAGNOSIS — R739 Hyperglycemia, unspecified: Secondary | ICD-10-CM | POA: Diagnosis not present

## 2022-08-20 DIAGNOSIS — E010 Iodine-deficiency related diffuse (endemic) goiter: Secondary | ICD-10-CM | POA: Diagnosis not present

## 2022-08-20 NOTE — Patient Instructions (Signed)
Unintentional Weight Loss Lost 15 pounds over 2 months. Increased physical activity due to new job and decreased sugar intake. No other concerning symptoms for malignancy or other serious conditions. Discussed the benefits of further weight loss to achieve a BMI <30.(but still monitor closely as we discussed) -Monitor weight. -Encouraged to monitor daily steps and calorie intake. -Check metabolic panel and A1c today to rule out diabetes.  General Health Maintenance History of gestational diabetes. No recent Pap smear or breast self-exam. -Encouraged to perform regular breast self-exams. -Recommended to schedule a Pap smear. -any new of changing symptoms let me know.   mentioned fatigue at end of visit. add tsh, t4, cbc and b12.   Follow up one month or sooner if needed.   follow up in one month or sooner if needed

## 2022-08-20 NOTE — Progress Notes (Signed)
   Subjective:    Patient ID: Melanie Cruz, female    DOB: [birth year], 27 y.o.   MRN: [id number]  HPI  Discussed the use of AI scribe software for clinical note transcription with the patient, who gave verbal consent to proceed.  History of Present Illness   The patient, with a history of gestational diabetes, presents with unintentional weight loss over the past two months. 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 She describes her work as physically demanding, involving continuous movement and significant sweating from 6 AM to 3:30 PM, although she does not track her daily steps.  The patient denies any intentional efforts to lose weight and expresses concern about the weight loss, stating she does not wish to drop below 200 lbs. Her dietary habits have changed since her diagnosis of gestational diabetes, with a significant reduction in sugar intake. She also reports occasional meal skipping due to fatigue after work and childcare responsibilities.  The patient has a history of gestational diabetes, diagnosed during her last pregnancy, which was a year ago. She was required to monitor her blood sugar four times a day during the pregnancy and used a glucose monitor implanted in her arm. She denies ever having an A1c above 6.5. lmp- on birth control.  The patient denies any current stomach pain, vomiting, or blood in her stool. She also denies any abnormal lumps in her breasts. She reports some bleeding related to her menstrual cycle due to her birth control, but no rectal bleeding. She expresses a desire to have her A1c checked to ensure she is not in the diabetic range.       Review of Systems See hpi.    Objective:   Physical Exam  General Mental Status- Alert. General Appearance- Not in acute distress.   Skin General: Color- Normal Color. Moisture- Normal Moisture.  Neck Carotid Arteries-  Normal color. Moisture- Normal Moisture. No carotid bruits. No JVD.  Chest and Lung Exam Auscultation: Breath Sounds:- clear even and unlabored.  Cardiovascular Auscultation:Rythm- Regular. Murmurs & Other Heart Sounds:Auscultation of the heart reveals- No Murmurs.  Abdomen Inspection:-Inspeection Normal. Palpation/Percussion:Note:No mass. Palpation and Percussion of the abdomen reveal- Non Tender, Non Distended + BS, no rebound or guarding.    Neurologic Cranial Nerve exam:- CN III-XII intact(No nystagmus), symmetric smile. Strength:- 5/5 equal and symmetric strength both upper and lower extremities.       Assessment & Plan:  Assessment and Plan    Unintentional Weight Loss Lost 15 pounds over 2 months. Increased physical activity due to new job and decreased sugar intake. No other concerning symptoms for malignancy or other serious conditions. Discussed the benefits of further weight loss to achieve a BMI <30.(but still monitor closely as we discussed) -Monitor weight. -Encouraged to monitor daily steps and calorie intake. -Check metabolic panel and A1c today to rule out diabetes.  General Health Maintenance History of gestational diabetes. No recent Pap smear or breast self-exam. -Encouraged to perform regular breast self-exams. -Recommended to schedule a Pap smear. -any new of changing symptoms let me know.   mentioned fatigue at end of visit. add tsh, t4, cbc and b12.   Follow up one month or sooner if needed.   follow up in one month or sooner if needed   [employer]

## 2022-08-21 ENCOUNTER — Encounter: Payer: Self-pay | Admitting: Medical

## 2022-08-22 NOTE — Telephone Encounter (Signed)
Pt doesn't have DM, but has had elevated glucose levels in the past. A1C was nml. Please advise.

## 2022-09-10 ENCOUNTER — Telehealth: Payer: Self-pay

## 2022-09-10 NOTE — Telephone Encounter (Signed)
Initial Comment Caller states she's having discharge/leakage from her belly button. Translation No Nurse Assessment Nurse: Floyce Stakes, RN, Orpah Cobb Date/Time (Eastern Time): 09/07/2022 3:16:35 PM Confirm and document reason for call. If symptomatic, describe symptoms. ---Caller states she has some leakage/discharge that was noticed 2weeks ago inside the navel. States the drainage is clear but has a mild. Denies abd pain. States the area inside the navel dose crust and she sometime try to scrape it out and when she do it causes a mild discomfort. Temp unknown. Does the patient have any new or worsening symptoms? ---Yes Will a triage be completed? ---Yes Related visit to physician within the last 2 weeks? ---No Does the PT have any chronic conditions? (i.e. diabetes, asthma, this includes High risk factors for pregnancy, etc.) ---Yes List chronic conditions. ---pre-diabetic Is the patient pregnant or possibly pregnant? (Ask all females between the ages of 49-55) ---No Is this a behavioral health or substance abuse call? ---No Guidelines Guideline Title Affirmed Question Affirmed Notes Nurse Date/Time Lamount Cohen Time) Abdominal Pain - Female [1] MILDMODERATE pain AND [2] comes and goes (cramps) Floyce Stakes, RN, Orpah Cobb 09/07/2022 3:22:37 PM PLEASE NOTE: All timestamps contained within this report are represented as [place] Standard Time. CONFIDENTIALTY NOTICE: This fax transmission is intended only for the addressee. It contains information that is legally privileged, confidential or otherwise protected from use or disclosure. If you are not the intended recipient, you are strictly prohibited from reviewing, disclosing, copying using or disseminating any of this information or taking any action in reliance on or regarding this information. If you have received this fax in error, please notify us immediately by telephone so that we can arrange for its return to [place]. Phone: [phone],  Toll-Free: [phone], Fax: [phone] Page: 2 of 2 Call Id: [id number] Disp. Time Lamount Cohen Time) Disposition Final User 09/07/2022 3:25:38 PM Home Care Yes Floyce Stakes, RN, Orpah Cobb Final Disposition 09/07/2022 3:25:38 PM Home Care Yes Floyce Stakes, RN, Orpah Cobb Caller Disagree/Comply Comply Caller Understands Yes PreDisposition Go to Urgent Care/Walk-In Clinic Care Advice Given Per Guideline HOME CARE: * You should be able to treat this at home. CALL BACK IF: * You become worse CARE ADVICE given per Abdominal Pain - Female (Adult) guideline

## 2022-09-11 ENCOUNTER — Telehealth (HOSPITAL_BASED_OUTPATIENT_CLINIC_OR_DEPARTMENT_OTHER): Payer: Self-pay

## 2022-09-21 ENCOUNTER — Ambulatory Visit (HOSPITAL_BASED_OUTPATIENT_CLINIC_OR_DEPARTMENT_OTHER): Admission: RE | Admit: 2022-09-21 | Payer: Medicaid Other | Source: Ambulatory Visit

## 2022-09-30 ENCOUNTER — Ambulatory Visit (HOSPITAL_BASED_OUTPATIENT_CLINIC_OR_DEPARTMENT_OTHER)
Admission: RE | Admit: 2022-09-30 | Discharge: 2022-09-30 | Disposition: A | Discharge status: Home / Self Care | Payer: Medicaid Other | Source: Ambulatory Visit | Attending: Medical | Admitting: Medical

## 2022-09-30 DIAGNOSIS — E010 Iodine-deficiency related diffuse (endemic) goiter: Secondary | ICD-10-CM | POA: Diagnosis present

## 2022-10-02 ENCOUNTER — Ambulatory Visit: Payer: Medicaid Other | Admitting: Medical

## 2022-10-04 ENCOUNTER — Ambulatory Visit: Payer: Medicaid Other | Admitting: Medical

## 2022-10-09 ENCOUNTER — Ambulatory Visit (INDEPENDENT_AMBULATORY_CARE_PROVIDER_SITE_OTHER): Payer: Medicaid Other | Admitting: Medical

## 2022-10-09 ENCOUNTER — Other Ambulatory Visit: Payer: Self-pay | Admitting: Medical

## 2022-10-09 VITALS — BP 121/76 | HR 88 | Temp 98.0°F | Resp 16 | Ht 65.0 in | Wt 193.6 lb

## 2022-10-09 DIAGNOSIS — Z6832 Body mass index (BMI) 32.0-32.9, adult: Secondary | ICD-10-CM | POA: Diagnosis not present

## 2022-10-09 DIAGNOSIS — K219 Gastro-esophageal reflux disease without esophagitis: Secondary | ICD-10-CM | POA: Diagnosis not present

## 2022-10-09 DIAGNOSIS — L600 Ingrowing nail: Secondary | ICD-10-CM

## 2022-10-09 DIAGNOSIS — E669 Obesity, unspecified: Secondary | ICD-10-CM | POA: Diagnosis not present

## 2022-10-09 MED ORDER — FAMOTIDINE 20 MG PO TABS: 20.0000 mg | ORAL_TABLET | Freq: Every day | ORAL | 0 refills | Status: DC

## 2022-10-09 MED ORDER — AMOXICILLIN-POT CLAVULANATE 875-125 MG PO TABS
1.0000 | ORAL_TABLET | Freq: Two times a day (BID) | ORAL | 0 refills | Status: DC
Start: 2022-10-09 — End: 2022-11-07

## 2022-10-09 NOTE — Patient Instructions (Signed)
Overweight and Prediabetes BMI of 32 with a recent weight loss of 2 pounds. Previous A1c of 5.8. Discussed the importance of maintaining a healthy diet and regular physical activity. -Continue to monitor weight and aim for a BMI less than 30. -Continue to limit intake of processed carbs, sugars, and fried foods. -Continue to increase physical activity.   Epigastric Pain and Belching Recent onset of intermittent epigastric pain and belching, possibly associated with consumption of fried foods and caffeinated beverages. No current symptoms of heartburn or acid reflux. -Start famotidine. -Reduce intake of fried foods, caffeine, and alcohol. -Consider EKG if symptoms change dramatically. Pt declined ekg today.  Ingrown Toenail Right great toe medial aspect tender to touch and inflamed, possibly due to recent pedicure. No yellow discharge noted. -Start Augmentin 875mg  twice a day for 10 days. -Soak foot in warm salt water twice a day. -Follow-up in 10 days to assess response to treatment. -Consider referral to podiatrist if symptoms persist.

## 2022-10-09 NOTE — Progress Notes (Signed)
Subjective:    Patient ID: Melanie Cruz, female    DOB: 10/13/1995, 27 y.o.   MRN: [id number]  HPI  Discussed the use of AI scribe software for clinical note transcription with the patient, who gave verbal consent to proceed.  History of Present Illness   The patient, with a history of prediabetes, presented for a follow-up visit after a weight loss of 15 pounds since her last visit in August 2013. She reported a recent weight fluctuation, with a current weight of 193 pounds, down from 195 pounds. Despite this weight loss, the patient's BMI remains at 32, indicating a need for further weight reduction. She expressed a desire to build muscle mass through protein intake and exercise. But during intial wt loss period was eating much healthier and less.  Since last visit this summer patient admitted to not maintaining a healthy diet, consuming a significant amount of fried foods and caffeinated beverages, specifically Red Bull. She but admits to increased beer intake.  Over the past two days, the patient experienced epigastric pain and frequent belching, which she associated with her dietary habits. She reported one episode of acid reflux, which she attributed to a specific food, though she could not recall what she had eaten. The belching was sporadic, occurring every 2 days  In addition to these symptoms, the patient reported an issue with her right great toe. She described tenderness and inflammation in the medial aspect of the toe, which had been present for approximately two weeks. The patient suspected an ingrown toenail, possibly resulting from a recent pedicure. She denied any creamy yellow discharge but noted redness and discomfort.       Lmp- 3 days ago   Review of Systems  Constitutional:  Negative for chills, fatigue and fever.  HENT:  Negative for congestion and ear pain.   Respiratory:  Negative for cough, chest tightness, shortness of breath and wheezing.   Cardiovascular:   Negative for chest pain and palpitations.  Gastrointestinal:  Positive for abdominal pain. Negative for abdominal distention, blood in stool, diarrhea, nausea and vomiting.  Genitourinary:  Negative for dysuria, flank pain and frequency.  Musculoskeletal:  Negative for back pain.       Rt great toe pain.   Skin:  Negative for rash.  Neurological:  Negative for dizziness and numbness.  Hematological:  Negative for adenopathy. Does not bruise/bleed easily.  Psychiatric/Behavioral:  Negative for behavioral problems and confusion.    Past Medical History:  Diagnosis Date   COVID-19    Gestational diabetes mellitus (GDM), antepartum 02/21/2021   Migraine      Social History   Socioeconomic History   Marital status: Single    Spouse name: Not on file   Number of children: Not on file   Years of education: Not on file   Highest education level: Not on file  Occupational History   Not on file  Tobacco Use   Smoking status: Every Day    Current packs/day: 0.10    Average packs/day: 0.1 packs/day for 5.0 years (0.5 ttl pk-yrs)    Types: Cigarettes    Passive exposure: Past   Smokeless tobacco: Never   Tobacco comments:    2 cig daily  Vaping Use   Vaping status: Former   Substances: Flavoring  Substance and Sexual Activity   Alcohol use: Not Currently   Drug use: Not Currently    Types: Marijuana    Comment: occasional-last use 05/23/2017   Sexual activity: Yes  Birth control/protection: Inserts    Comment: nuvaring   Other Topics Concern   Not on file  Social History Narrative   Lives with roommate   Right handed   Caffeine: 0-1 cup/day   Social Determinants of Health   Financial Resource Strain: Low Risk  (08/20/2021)   [facility]   Overall Financial Resource Strain (CARDIA)    Difficulty of Paying Living Expenses: Not very hard  Food Insecurity: Food Insecurity Present (08/20/2021)   Received from [facility]   Hunger Vital Sign   Transportation Needs: No Transportation Needs (08/20/2021)   Received from [facility]   PRAPARE - Transportation  Physical Activity: Insufficiently Active (08/20/2021)   [facility]   Exercise Vital Sign    Days of Exercise per Week: 1 day    Minutes of Exercise per Session: 30 min  Stress: No Stress Concern Present (08/20/2021)   Received from [facility]   [employer] of Occupational Health - Occupational Stress Questionnaire  Social Connections: Moderately Integrated (08/20/2021)   Received from [facility]   Social Connection and Isolation Panel [NHANES]  Intimate Partner Violence: Not At Risk (08/20/2021)   [facility] visits prior to 03/09/2022., [facility] visits prior to 03/09/2022.   Humiliation, Afraid, Rape, and Kick questionnaire    Fear of Current or Ex-Partner: No    Emotionally Abused: No    Physically Abused: No    Sexually Abused: No    Past Surgical History:  Procedure Laterality Date   NO PAST SURGERIES      Family History  Problem Relation Age of Onset   Hypertension Mother    Diabetes Mother    Breast cancer Paternal Aunt        age at onset 24's   Migraines Neg Hx     Allergies  Allergen Reactions   Azithromycin Anaphylaxis   Cefdinir Other (See Comments)    Had sensation in throat after taking, was getting over a cold at the time.   Fluconazole Rash    Current Outpatient Medications on File Prior to Visit  Medication Sig Dispense Refill   busPIRone (BUSPAR) 10 MG tablet Take 1 tablet (10 mg total) by mouth 2 (two) times daily. 60 tablet 0   diclofenac Sodium (VOLTAREN) 1 % GEL Apply 2 g topically 4 (four) times daily. 50 g 1   fluticasone (FLONASE) 50 MCG/ACT nasal spray SHAKE LIQUID AND USE 2 SPRAYS IN EACH NOSTRIL DAILY 48 g 2   levocetirizine (XYZAL) 5 MG tablet Take 1 tablet (5 mg total) by mouth every evening. 30 tablet 3   meloxicam (MOBIC) 15  MG tablet Take 1 tablet (15 mg total) by mouth daily. 30 tablet 3   metFORMIN (GLUCOPHAGE) 500 MG tablet Take 1 tablet (500 mg total) by mouth 2 (two) times daily with a meal. 180 tablet 3   metroNIDAZOLE (FLAGYL) 500 MG tablet Take 1 tablet (500 mg total) by mouth 2 (two) times daily. 14 tablet 0   metroNIDAZOLE (METROGEL) 1 % gel Apply topically daily. 45 g 0   nitrofurantoin, macrocrystal-monohydrate, (MACROBID) 100 MG capsule Take 1 capsule (100 mg total) by mouth 2 (two) times daily. 14 capsule 0   RETIN-A 0.05 % cream      No current facility-administered medications on file prior to visit.    BP 121/76 (BP Location: Left Arm, Patient Position: Sitting, Cuff Size: Normal)   Pulse  88   Temp 98 F (36.7 C) (Oral)   Resp 16   Ht 5\' 5"  (1.651 m)   Wt 193 lb 9.6 oz (87.8 kg)   SpO2 100%   BMI 32.22 kg/m        Objective:   Physical Exam  General Mental Status- Alert. General Appearance- Not in acute distress.   Skin General: Color- Normal Color. Moisture- Normal Moisture.  Neck Carotid Arteries- Normal color. Moisture- Normal Moisture. No carotid bruits. No JVD.  Chest and Lung Exam Auscultation: Breath Sounds:-Normal.  Cardiovascular Auscultation:Rythm- Regular. Murmurs & Other Heart Sounds:Auscultation of the heart reveals- No Murmurs.  Abdomen Inspection:-Inspeection Normal. Palpation/Percussion:Note:No mass. Palpation and Percussion of the abdomen reveal- Non Tender, Non Distended + BS, no rebound or guarding.   Neurologic Cranial Nerve exam:- CN III-XII intact(No nystagmus), symmetric smile. Strength:- 5/5 equal and symmetric strength both upper and lower extremities.       Assessment & Plan:   Assessment and Plan    Overweight and Prediabetes BMI of 32 with a recent weight loss of 2 pounds. Previous A1c of 5.8. Discussed the importance of maintaining a healthy diet and regular physical activity. -Continue to monitor weight and aim for a BMI less than  30. -Continue to limit intake of processed carbs, sugars, and fried foods. -Continue to increase physical activity.   Epigastric Pain and Belching Recent onset of intermittent epigastric pain and belching, possibly associated with consumption of fried foods and caffeinated beverages. No current symptoms of heartburn or acid reflux. -Start famotidine. -Reduce intake of fried foods, caffeine, and alcohol. -Consider EKG if symptoms change dramatically. Pt declined ekg today.  Ingrown Toenail Right great toe medial aspect tender to touch and inflamed, possibly due to recent pedicure. No yellow discharge noted. -Start Augmentin 875mg  twice a day for 10 days. -Soak foot in warm salt water twice a day. -Follow-up in 10 days to assess response to treatment. -Consider referral to podiatrist if symptoms persist.       Pt has been on augmentin before no side effect/allergic reaction  Esperanza Richters, PA-C

## 2022-10-23 ENCOUNTER — Ambulatory Visit: Payer: Medicaid Other | Admitting: Medical

## 2022-10-28 ENCOUNTER — Ambulatory Visit: Payer: Medicaid Other | Admitting: Medical

## 2022-10-29 ENCOUNTER — Encounter: Payer: Self-pay | Admitting: Medical

## 2022-10-29 ENCOUNTER — Ambulatory Visit: Payer: Medicaid Other | Admitting: Medical

## 2022-10-29 VITALS — BP 110/59 | HR 85 | Temp 98.6°F | Ht 65.0 in | Wt 192.4 lb

## 2022-10-29 DIAGNOSIS — R739 Hyperglycemia, unspecified: Secondary | ICD-10-CM

## 2022-10-29 NOTE — Patient Instructions (Addendum)
Ingrown Toenail with infection Resolved with antibiotics. No current pain. Noted skin hyper pigmentation changes post-infection. Discussed proper nail cutting techniques to prevent recurrence. -Continue proper nail cutting techniques. -if nail ingrows again then refer podiatrist.  Epigastric Pain and Belching Symptoms persist despite famotidine. Discussed potential dietary triggers including fried foods, caffeine, and alcohol. Patient plans to modify diet. -Continue famotidine. -Modify diet to reduce potential triggers. -if symptoms persists would switch famotadine to ppi  Alcohol Consumption Daily intake of 1-2 small beers. Discussed potential impact on epigastric symptoms and advised moderation. -Advise if want to drink no more than 2 small cans a day.  Obese BMI 32. Discussed potential health benefits of weight reduction. -Consider weight loss to achieve BMI <30.  Prediabetes Discussed future metabolic panel and HbA1c testing. -Schedule metabolic panel and HbA1c for November 14th or 15th, 2024. -Follow-up based on lab results.

## 2022-10-29 NOTE — Progress Notes (Signed)
   Subjective:    Patient ID: Melanie Cruz, female    DOB: 1995-04-05, 27 y.o.   MRN: [id number]  HPI Discussed the use of AI scribe software for clinical note transcription with the patient, who gave verbal consent to proceed.  History of Present Illness   The patient, with a history of an ingrown toenail on the right great toe with infection, presented for a follow-up visit. She reported no pain or discomfort since the last visit when she was prescribed antibiotics. Today follow up was to see if needed to see podiatrist, No pain, no redness and not discharge. some hyperpigmented skin post infection.  In addition to the toenail issue, the patient reported intermittent epigastric pain and belching. She admitted to a partial reduction in the consumption of fried foods and caffeinated beverages, which were previously identified as potential triggers. Despite this dietary modification, the patient continued to experience belching. The effectiveness of the prescribed famotidine was uncertain to the patient.  The patient also reported daily alcohol consumption, typically one to two small cans of beer. She expressed a willingness to modify her diet further, but preferred to continue her alcohol intake.  The patient's weight was stable, with a slight loss of one pound since the last visit. Her BMI was noted to be slightly above the recommended range, and she was informed of the potential benefits of weight reduction. The patient was also informed of her prediabetic status.        Review of Systems See hpi    Objective:   Physical Exam  General- No acute distress. Pleasant patient. Neck- Full range of motion, no jvd Lungs- Clear, even and unlabored. Heart- regular rate and rhythm. Neurologic- CNII- XII grossly intact.   Rt great toe- medial aspect no longer red, swollen ,tender or any dc.but some post inflammatory hyperpigmentation post infection.    Assessment & Plan:  Assessment and  Plan    Patient Instructions  Ingrown Toenail with infection Resolved with antibiotics. No current pain. Noted skin hyper pigmentation changes post-infection. Discussed proper nail cutting techniques to prevent recurrence. -Continue proper nail cutting techniques. -if nail ingrows again then refer podiatrist.  Epigastric Pain and Belching Symptoms persist despite famotidine. Discussed potential dietary triggers including fried foods, caffeine, and alcohol. Patient plans to modify diet. -Continue famotidine. -Modify diet to reduce potential triggers. -if symptoms persists would switch famotadine to ppi  Alcohol Consumption Daily intake of 1-2 small beers. Discussed potential impact on epigastric symptoms and advised moderation. -Advise if want to drink no more than 2 small cans a day.  Obese BMI 32. Discussed potential health benefits of weight reduction. -Consider weight loss to achieve BMI <30.  Prediabetes Discussed future metabolic panel and HbA1c testing. -Schedule metabolic panel and HbA1c for November 14th or 15th, 2024. -Follow-up based on lab results.    Esperanza Richters, PA-C

## 2022-11-07 ENCOUNTER — Encounter: Payer: Self-pay | Admitting: Medical

## 2022-11-07 ENCOUNTER — Ambulatory Visit (HOSPITAL_BASED_OUTPATIENT_CLINIC_OR_DEPARTMENT_OTHER)
Admission: RE | Admit: 2022-11-07 | Discharge: 2022-11-07 | Disposition: A | Discharge status: Home / Self Care | Payer: Medicaid Other | Source: Ambulatory Visit | Attending: Medical | Admitting: Medical

## 2022-11-07 ENCOUNTER — Ambulatory Visit: Payer: Medicaid Other | Admitting: Medical

## 2022-11-07 VITALS — BP 122/77 | HR 99 | Temp 99.6°F | Resp 16 | Ht 65.0 in | Wt 186.0 lb

## 2022-11-07 DIAGNOSIS — J3489 Other specified disorders of nose and nasal sinuses: Secondary | ICD-10-CM | POA: Diagnosis not present

## 2022-11-07 DIAGNOSIS — M898X6 Other specified disorders of bone, lower leg: Secondary | ICD-10-CM | POA: Diagnosis present

## 2022-11-07 DIAGNOSIS — R0981 Nasal congestion: Secondary | ICD-10-CM | POA: Diagnosis not present

## 2022-11-07 LAB — POC COVID19 BINAXNOW: SARS Coronavirus 2 Ag: NEGATIVE

## 2022-11-07 MED ORDER — LEVOCETIRIZINE DIHYDROCHLORIDE 5 MG PO TABS: 5.0000 mg | ORAL_TABLET | Freq: Every evening | ORAL | 3 refills | Status: DC

## 2022-11-07 MED ORDER — FLUTICASONE PROPIONATE 50 MCG/ACT NA SUSP
2.0000 | Freq: Every day | NASAL | 1 refills | Status: DC
Start: 2022-11-07 — End: 2022-12-03

## 2022-11-07 MED ORDER — DOXYCYCLINE HYCLATE 100 MG PO TABS
100.0000 mg | ORAL_TABLET | Freq: Two times a day (BID) | ORAL | 0 refills | Status: DC
Start: 2022-11-07 — End: 2022-12-03

## 2022-11-07 NOTE — Progress Notes (Signed)
Subjective:    Patient ID: Melanie Cruz, female    DOB: 04-07-1995, 27 y.o.   MRN: [id number]  HPI   Discussed the use of AI scribe software for clinical note transcription with the patient, who gave verbal consent to proceed.  History of Present Illness   The patient, with a history of allergic rhinitis, presented with sinus pressure and nasal congestion for the past two to three days. She also reported pain in the upper left side of her teeth, which was evaluated by a dentist who found no evidence of tooth infection. The patient denied any obvious fevers, chills, or sweats, and did not report any body aches beyond her usual soreness due to her physically demanding job. She also reported occasional sneezing and a history of seasonal allergies.  In addition to the sinus symptoms, the patient noticed a lump on her proximal left tibia and another smaller bump that was tender to touch, both appearing two to three days prior to the consultation. She denied any trauma to the area. The patient's work involves a lot of squatting and occasional kneeling, but she could not recall any specific incident that might have caused the swelling.  The patient also underwent a COVID-19 test due to work requirements, which returned negative. She was not experiencing any symptoms suggestive of COVID-19 at the time of testing.       History of Present Illness           Review of Systems  Constitutional:  Negative for chills and fatigue.  HENT:  Positive for congestion, sinus pressure, sinus pain and sneezing.   Respiratory:  Negative for cough, chest tightness, shortness of breath and wheezing.   Cardiovascular:  Negative for chest pain and palpitations.  Gastrointestinal:  Negative for abdominal pain and blood in stool.  Musculoskeletal:  Negative for back pain.  Skin:  Negative for rash.  Neurological:  Negative for dizziness, seizures, weakness and headaches.  Hematological:  Negative for adenopathy.  Does not bruise/bleed easily.  Psychiatric/Behavioral:  Negative for behavioral problems and decreased concentration.     Past Medical History:  Diagnosis Date   COVID-19    Gestational diabetes mellitus (GDM), antepartum 02/21/2021   Migraine      Social History   Socioeconomic History   Marital status: Single    Spouse name: Not on file   Number of children: Not on file   Years of education: Not on file   Highest education level: Not on file  Occupational History   Not on file  Tobacco Use   Smoking status: Every Day    Current packs/day: 0.10    Average packs/day: 0.1 packs/day for 5.0 years (0.5 ttl pk-yrs)    Types: Cigarettes    Passive exposure: Past   Smokeless tobacco: Never   Tobacco comments:    2 cig daily  Vaping Use   Vaping status: Former   Substances: Flavoring  Substance and Sexual Activity   Alcohol use: Not Currently   Drug use: Not Currently    Types: Marijuana    Comment: occasional-last use 05/23/2017   Sexual activity: Yes    Birth control/protection: Inserts    Comment: nuvaring   Other Topics Concern   Not on file  Social History Narrative   Lives with roommate   Right handed   Caffeine: 0-1 cup/day   Social Determinants of Health   Financial Resource Strain: Low Risk  (08/20/2021)   [facility]  Overall Financial Resource Strain (CARDIA)    Difficulty of Paying Living Expenses: Not very hard  Food Insecurity: Food Insecurity Present (08/20/2021)   Received from [facility]   Hunger Vital Sign  Transportation Needs: No Transportation Needs (08/20/2021)   Received from [facility]   PRAPARE - Transportation  Physical Activity: Insufficiently Active (08/20/2021)   [facility]   Exercise Vital Sign    Days of Exercise per Week: 1 day    Minutes of Exercise per Session: 30 min  Stress: No Stress Concern Present (08/20/2021)   Received from [facility]   [employer] of Occupational Health - Occupational Stress Questionnaire  Social Connections: Moderately Integrated (08/20/2021)   Received from [facility]   Social Connection and Isolation Panel [NHANES]  Intimate Partner Violence: Not At Risk (08/20/2021)   [facility] visits prior to 03/09/2022., [facility] visits prior to 03/09/2022.   Humiliation, Afraid, Rape, and Kick questionnaire    Fear of Current or Ex-Partner: No    Emotionally Abused: No    Physically Abused: No    Sexually Abused: No    Past Surgical History:  Procedure Laterality Date   NO PAST SURGERIES      Family History  Problem Relation Age of Onset   Hypertension Mother    Diabetes Mother    Breast cancer Paternal Aunt        age at onset 70's   Migraines Neg Hx     Allergies  Allergen Reactions   Azithromycin Anaphylaxis   Cefdinir Other (See Comments)    Had sensation in throat after taking, was getting over a cold at the time.   Fluconazole Rash    Current Outpatient Medications on File Prior to Visit  Medication Sig Dispense Refill   busPIRone (BUSPAR) 10 MG tablet Take 1 tablet (10 mg total) by mouth 2 (two) times daily. 60 tablet 0   diclofenac Sodium (VOLTAREN) 1 % GEL Apply 2 g topically 4 (four) times daily. 50 g 1   famotidine (PEPCID) 20 MG tablet TAKE 1 TABLET(20 MG) BY MOUTH DAILY 90 tablet 0   fluticasone (FLONASE) 50 MCG/ACT nasal spray SHAKE LIQUID AND USE 2 SPRAYS IN EACH NOSTRIL DAILY 48 g 2   levocetirizine (XYZAL) 5 MG tablet Take 1 tablet (5 mg total) by mouth every evening. 30 tablet 3   meloxicam (MOBIC) 15 MG tablet Take 1 tablet (15 mg total) by mouth daily. 30 tablet 3   metFORMIN (GLUCOPHAGE) 500 MG tablet Take 1 tablet (500 mg total) by mouth 2 (two) times daily with a meal. 180 tablet 3   RETIN-A 0.05 % cream      metroNIDAZOLE (FLAGYL) 500 MG tablet Take 1 tablet (500 mg total) by mouth 2 (two) times daily. 14 tablet 0    No current facility-administered medications on file prior to visit.    BP 122/77 (BP Location: Right Arm, Patient Position: Sitting, Cuff Size: Normal)   Pulse 99   Temp 99.6 F (37.6 C) (Oral)   Resp 16   Ht 5\' 5"  (1.651 m)   Wt 186 lb (84.4 kg)   SpO2 100%   BMI 30.95 kg/m        Objective:   Physical Exam  General Mental Status- Alert. General Appearance- Not in acute distress.   Skin General: Color- Normal Color. Moisture- Normal Moisture.  Neck Carotid Arteries- Normal color. Moisture- Normal Moisture. No carotid  bruits. No JVD.  Chest and Lung Exam Auscultation: Breath Sounds:-Normal.  Cardiovascular Auscultation:Rythm- Regular. Murmurs & Other Heart Sounds:Auscultation of the heart reveals- No Murmurs.  Abdomen Inspection:-Inspeection Normal. Palpation/Percussion:Note:No mass. Palpation and Percussion of the abdomen reveal- Non Tender, Non Distended + BS, no rebound or guarding.   Neurologic Cranial Nerve exam:- CN III-XII intact(No nystagmus), symmetric smile. Strength:- 5/5 equal and symmetric strength both upper and lower extremities.    Heent- left side maxillary sinus tender both canals blocked with wax. Can't see tm Boggy turbinates.  Left lower ext- proximal tibia mild swelling and tenderness. No calf swelling. Negative homans sign.    Assessment & Plan:  Assessment and Plan    Sinus Pressure and Nasal Congestion Likely allergic rhinitis vs upper respiratory infection. No fever, chills, or sweats. Dental evaluation revealed no tooth infection. -Start Flonase nasal spray, two sprays each nostril daily. -Start Xyzal 5mg , one tablet at night. -If symptoms persist, start Doxycycline (prescription sent to pharmacy). Dentist mentioned possible sinus infection on xray  Cerumen Impaction Bilateral cerumen impaction obstructing view of tympanic membranes. -Use over-the-counter Debrox daily to soften wax. -Schedule ear irrigation in 7  days.  Left Shin Swelling Noticed 2-3 days ago, no known trauma. Possible contusion or soft tissue swelling. -Order tibia/fibula x-ray. -Apply ice twice daily. -Notify if area enlarges, becomes warm, or increasingly tender.  General Health Maintenance -Negative COVID-19 test.   Follow up in 7 days or sooner if needed

## 2022-11-07 NOTE — Patient Instructions (Signed)
Sinus Pressure and Nasal Congestion Likely allergic rhinitis vs upper respiratory infection. No fever, chills, or sweats. Dental evaluation revealed no tooth infection. -Start Flonase nasal spray, two sprays each nostril daily. -Start Xyzal 5mg , one tablet at night. -If symptoms persist, start Doxycycline (prescription sent to pharmacy). Dentist mentioned possible sinus infection on xray  Cerumen Impaction Bilateral cerumen impaction obstructing view of tympanic membranes. -Use over-the-counter Debrox daily to soften wax. -Schedule ear irrigation in 7 days.  Left Shin Swelling Noticed 2-3 days ago, no known trauma. Possible contusion or soft tissue swelling. -Order tibia/fibula x-ray. -Apply ice twice daily. -Notify if area enlarges, becomes warm, or increasingly tender.  General Health Maintenance -Negative COVID-19 test.   Follow up in 7 days or sooner if needed

## 2022-11-08 ENCOUNTER — Encounter: Payer: Self-pay | Admitting: Medical

## 2022-11-08 MED ORDER — METHYLPREDNISOLONE 4 MG PO TABS
ORAL_TABLET | ORAL | 0 refills | Status: DC
Start: 2022-11-08 — End: 2022-12-03

## 2022-11-08 NOTE — Addendum Note (Signed)
Addended by: Gwenevere Abbot on: 11/08/2022 12:38 PM   Modules accepted: Orders

## 2022-11-21 ENCOUNTER — Other Ambulatory Visit (HOSPITAL_COMMUNITY)
Admission: RE | Admit: 2022-11-21 | Discharge: 2022-11-21 | Disposition: A | Discharge status: Home / Self Care | Payer: Medicaid Other | Source: Ambulatory Visit | Attending: Family Medicine | Admitting: Family Medicine

## 2022-11-21 ENCOUNTER — Encounter: Payer: Self-pay | Admitting: Family Medicine

## 2022-11-21 ENCOUNTER — Ambulatory Visit (INDEPENDENT_AMBULATORY_CARE_PROVIDER_SITE_OTHER): Payer: Medicaid Other | Admitting: Family Medicine

## 2022-11-21 VITALS — BP 114/67 | HR 76 | Ht 65.0 in | Wt 189.0 lb

## 2022-11-21 DIAGNOSIS — Z01419 Encounter for gynecological examination (general) (routine) without abnormal findings: Secondary | ICD-10-CM | POA: Insufficient documentation

## 2022-11-21 DIAGNOSIS — Z113 Encounter for screening for infections with a predominantly sexual mode of transmission: Secondary | ICD-10-CM

## 2022-11-21 DIAGNOSIS — Z1151 Encounter for screening for human papillomavirus (HPV): Secondary | ICD-10-CM

## 2022-11-22 ENCOUNTER — Ambulatory Visit: Payer: Medicaid Other | Admitting: Medical

## 2022-11-25 NOTE — Progress Notes (Signed)
ANNUAL EXAM Patient name: Melanie Cruz MRN [id number]  Date of birth: 19-Aug-1995 Chief Complaint:   Annual Exam  History of Present Illness:   Melanie Cruz is a 27 y.o.  [phone]  female  being seen today for a routine annual exam.  Current complaints: menses normal. No concerns.  No LMP recorded. Patient has had an implant.    Last pap 2021. Results were: NILM w/ HRHPV negative. H/O abnormal pap: no Last mammogram: n/a     10/09/2022    4:14 PM 03/04/2022   10:09 AM 09/11/2021    3:49 PM 01/25/2021    1:21 PM 05/09/2020   11:13 AM  Depression screen PHQ 2/9  Decreased Interest 0 0 0 0 0  Down, Depressed, Hopeless 0 0 0 0 0  PHQ - 2 Score 0 0 0 0 0  Altered sleeping    1   Tired, decreased energy    0   Change in appetite    0   Feeling bad or failure about yourself     0   Trouble concentrating    0   Moving slowly or fidgety/restless    0   Suicidal thoughts    0   PHQ-9 Score    1         01/25/2021    1:21 PM  GAD 7 : Generalized Anxiety Score  Nervous, Anxious, on Edge 0  Control/stop worrying 0  Worry too much - different things 0  Trouble relaxing 0  Restless 0  Easily annoyed or irritable 1  Afraid - awful might happen 0  Total GAD 7 Score 1     Review of Systems:   Pertinent items are noted in HPI Denies any headaches, blurred vision, fatigue, shortness of breath, chest pain, abdominal pain, abnormal vaginal discharge/itching/odor/irritation, problems with periods, bowel movements, urination, or intercourse unless otherwise stated above. Pertinent History Reviewed:  Reviewed past medical,surgical, social and family history.  Reviewed problem list, medications and allergies. Physical Assessment:   Vitals:   11/21/22 1603  BP: 114/67  Pulse: 76  Weight: 189 lb (85.7 kg)  Height: 5\' 5"  (1.651 m)  Body mass index is 31.45 kg/m.        Physical Examination:   General appearance - well appearing, and in no distress  Mental status - alert,  oriented to person, place, and time  Psych:  She has a normal mood and affect  Skin - warm and dry, normal color, no suspicious lesions noted  Chest - effort normal, all lung fields clear to auscultation bilaterally  Heart - normal rate and regular rhythm  Neck:  midline trachea, no thyromegaly or nodules  Breasts - breasts appear normal, no suspicious masses, no skin or nipple changes or axillary nodes  Abdomen - soft, nontender, nondistended, no masses or organomegaly  Pelvic - VULVA: normal appearing vulva with no masses, tenderness or lesions  VAGINA: normal appearing vagina with normal color and discharge, no lesions  CERVIX: normal appearing cervix without discharge or lesions, no CMT  Thin prep pap is done with HR HPV cotesting  UTERUS: uterus is felt to be normal size, shape, consistency and nontender   ADNEXA: No adnexal masses or tenderness noted.  Extremities:  No swelling or varicosities noted  Chaperone present for exam  Assessment & Plan:  1. Well woman exam with routine gynecological exam - Cytology - PAP( )  2. Screening for STDs (sexually transmitted diseases) - Hepatitis B  surface antigen - Hepatitis C antibody - HIV Antibody (routine testing w rflx) - RPR   Labs/procedures today:   Orders Placed This Encounter  Procedures   Hepatitis B surface antigen   Hepatitis C antibody   HIV Antibody (routine testing w rflx)   RPR    Meds: No orders of the defined types were placed in this encounter.   Follow-up: No follow-ups on file.  Levie Heritage, DO 11/25/2022 12:31 PM

## 2022-11-26 LAB — CYTOLOGY - PAP: Diagnosis: NEGATIVE

## 2022-12-03 ENCOUNTER — Encounter: Payer: Self-pay | Admitting: Medical

## 2022-12-03 ENCOUNTER — Ambulatory Visit (INDEPENDENT_AMBULATORY_CARE_PROVIDER_SITE_OTHER): Payer: Medicaid Other | Admitting: Medical

## 2022-12-03 VITALS — BP 107/64 | HR 92 | Ht 65.0 in | Wt 191.8 lb

## 2022-12-03 DIAGNOSIS — R21 Rash and other nonspecific skin eruption: Secondary | ICD-10-CM | POA: Diagnosis not present

## 2022-12-03 DIAGNOSIS — J309 Allergic rhinitis, unspecified: Secondary | ICD-10-CM

## 2022-12-03 DIAGNOSIS — T7840XA Allergy, unspecified, initial encounter: Secondary | ICD-10-CM | POA: Diagnosis not present

## 2022-12-03 MED ORDER — METHYLPREDNISOLONE 4 MG PO TABS
ORAL_TABLET | ORAL | 0 refills | Status: DC
Start: 2022-12-03 — End: 2023-11-12

## 2022-12-03 NOTE — Progress Notes (Signed)
Subjective:    Patient ID: Melanie Cruz, female    DOB: Jan 14, 1995, 27 y.o.   MRN: [id number]  HPI Discussed the use of AI scribe software for clinical note transcription with the patient, who gave verbal consent to proceed.  History of Present Illness   The patient, with a history of prediabetes and allergic rhinitis, presents with a recent rash and itching. The rash, which started about a week ago, is located on the shoulders, forearms, and hip, with itching also reported on the legs. The patient associates the onset of the rash with the initiation of Xyzal, an antihistamine prescribed for allergic rhinitis symptoms. However, the patient also reports the use of a new [employer] product and a collagen cream on the face around the same time.  The patient's allergic rhinitis symptoms, initially presenting as sinus pressure and nasal congestion a month ago, were treated with Flonase and Xyzal. The patient reports inconsistent use of Xyzal, only starting it a week ago, and uncertain efficacy of Flonase.  Regarding her prediabetes, the patient's last A1C was 5.8, corresponding to an average blood glucose of 118. The patient has a history of metformin use, which was well-tolerated during a previous pregnancy. The patient admits to recent dietary indiscretions and is considering restarting metformin for prediabetes management.        Review of Systems  Constitutional:  Negative for chills, fatigue and fever.  Respiratory:  Negative for cough, chest tightness, shortness of breath and wheezing.   Cardiovascular:  Negative for chest pain and palpitations.  Gastrointestinal:  Negative for abdominal pain.  Skin:  Positive for rash.  Neurological:  Negative for dizziness, numbness and headaches.  Hematological:  Negative for adenopathy. Does not bruise/bleed easily.    Past Medical History:  Diagnosis Date   COVID-19    Gestational diabetes mellitus (GDM), antepartum 02/21/2021    Migraine      Social History   Socioeconomic History   Marital status: Single    Spouse name: Not on file   Number of children: Not on file   Years of education: Not on file   Highest education level: Not on file  Occupational History   Not on file  Tobacco Use   Smoking status: Every Day    Current packs/day: 0.10    Average packs/day: 0.1 packs/day for 5.0 years (0.5 ttl pk-yrs)    Types: Cigarettes    Passive exposure: Past   Smokeless tobacco: Never   Tobacco comments:    2 cig daily  Vaping Use   Vaping status: Former   Substances: Flavoring  Substance and Sexual Activity   Alcohol use: Not Currently   Drug use: Not Currently    Types: Marijuana    Comment: occasional-last use 05/23/2017   Sexual activity: Yes    Birth control/protection: Implant  Other Topics Concern   Not on file  Social History Narrative   Lives with roommate   Right handed   Caffeine: 0-1 cup/day   Social Determinants of Health   Financial Resource Strain: Low Risk  (08/20/2021)   [facility]   Overall Financial Resource Strain (CARDIA)    Difficulty of Paying Living Expenses: Not very hard  Food Insecurity: Food Insecurity Present (08/20/2021)   Received from [facility]   Hunger Vital Sign  Transportation Needs: No Transportation Needs (08/20/2021)   Received from [facility] - Transportation  Physical Activity: Insufficiently Active (08/20/2021)   [facility]   Exercise Vital Sign    Days of Exercise per Week: 1 day    Minutes of Exercise per Session: 30 min  Stress: No Stress Concern Present (08/20/2021)   Received from [facility] - Occupational Stress Questionnaire  Social Connections: Moderately Integrated (08/20/2021)   Received from [facility]   Social Connection and Isolation Panel [NHANES]  Intimate Partner Violence: Not At Risk (08/20/2021)   [facility]  from [facility] visits prior to 03/09/2022., [facility] visits prior to 03/09/2022.   Humiliation, Afraid, Rape, and Kick questionnaire    Fear of Current or Ex-Partner: No    Emotionally Abused: No    Physically Abused: No    Sexually Abused: No    Past Surgical History:  Procedure Laterality Date   NO PAST SURGERIES      Family History  Problem Relation Age of Onset   Hypertension Mother    Diabetes Mother    Breast cancer Paternal Aunt        age at onset 28's   Migraines Neg Hx     Allergies  Allergen Reactions   Azithromycin Anaphylaxis   Cefdinir Other (See Comments)    Had sensation in throat after taking, was getting over a cold at the time.   Fluconazole Rash    Current Outpatient Medications on File Prior to Visit  Medication Sig Dispense Refill   busPIRone (BUSPAR) 10 MG tablet Take 1 tablet (10 mg total) by mouth 2 (two) times daily. 60 tablet 0   diclofenac Sodium (VOLTAREN) 1 % GEL Apply 2 g topically 4 (four) times daily. 50 g 1   famotidine (PEPCID) 20 MG tablet TAKE 1 TABLET(20 MG) BY MOUTH DAILY 90 tablet 0   fluticasone (FLONASE) 50 MCG/ACT nasal spray SHAKE LIQUID AND USE 2 SPRAYS IN EACH NOSTRIL DAILY 48 g 2   meloxicam (MOBIC) 15 MG tablet Take 1 tablet (15 mg total) by mouth daily. 30 tablet 3   metFORMIN (GLUCOPHAGE) 500 MG tablet Take 1 tablet (500 mg total) by mouth 2 (two) times daily with a meal. (Patient not taking: Reported on 11/21/2022) 180 tablet 3   RETIN-A 0.05 % cream  (Patient not taking: Reported on 11/21/2022)     No current facility-administered medications on file prior to visit.    BP 107/64   Pulse 92   Ht 5\' 5"  (1.651 m)   Wt 191 lb 12.8 oz (87 kg)   SpO2 100%   BMI 31.92 kg/m        Objective:   Physical Exam  General- No acute distress. Pleasant patient. Neck- Full range of motion, no jvd Lungs- Clear, even and unlabored. Heart- regular rate and rhythm. Neurologic- CNII-  XII grossly intact.  Skin- faint rash on shoulder, distal forearm,legs and rt side abdomen.       Assessment & Plan:   Assessment and Plan    Patient Instructions  Allergic Rhinitis Symptoms of sinus pressure and nasal congestion. Unclear response to Flonase. Possible reaction to Xyzal, although unlikely. -Discontinue Xyzal. -Consider over-the-counter Allegra or Claritin for allergic rhinitis symptoms. continue flonase  Skin Rash(allergic reaction) New onset rash on shoulders, forearms, and hip with itching. Possible allergic reaction, although unclear etiology. -Prescribe tapered dose of Medrol to treat potential allergic reaction. -If rash reoccurs or persists, consider referral to a specialist.  Prediabetes A1C controlled in prediabetic range (5.8). Patient has  history of Metformin use without side effects. -Order A1C test to be done at patient's convenience. -Consider restarting Metformin once daily for prevention, based on patient's preference. you could just use once daily with recent good a1c.   Follow up in 3 months or sooner if needed   [employer], PA-C

## 2022-12-03 NOTE — Addendum Note (Signed)
Addended by: Gwenevere Abbot on: 12/03/2022 04:35 PM   Modules accepted: Orders

## 2022-12-03 NOTE — Patient Instructions (Signed)
Allergic Rhinitis Symptoms of sinus pressure and nasal congestion. Unclear response to Flonase. Possible reaction to Xyzal, although unlikely. -Discontinue Xyzal. -Consider over-the-counter Allegra or Claritin for allergic rhinitis symptoms. continue flonase  Skin Rash(allergic reaction) New onset rash on shoulders, forearms, and hip with itching. Possible allergic reaction, although unclear etiology. -Prescribe tapered dose of Medrol to treat potential allergic reaction. -If rash reoccurs or persists, consider referral to a specialist.  Prediabetes A1C controlled in prediabetic range (5.8). Patient has history of Metformin use without side effects. -Order A1C test to be done at patient's convenience. -Consider restarting Metformin once daily for prevention, based on patient's preference. you could just use once daily with recent good a1c.   Follow up in 3 months or sooner if needed

## 2022-12-11 ENCOUNTER — Encounter: Payer: Self-pay | Admitting: Medical

## 2022-12-11 MED ORDER — HYDROXYZINE HCL 10 MG PO TABS: 10.0000 mg | ORAL_TABLET | Freq: Three times a day (TID) | ORAL | 0 refills | Status: AC | PRN

## 2022-12-11 NOTE — Addendum Note (Signed)
Addended by: Gwenevere Abbot on: 12/11/2022 08:01 PM   Modules accepted: Orders

## 2022-12-12 ENCOUNTER — Ambulatory Visit (INDEPENDENT_AMBULATORY_CARE_PROVIDER_SITE_OTHER): Payer: Medicaid Other

## 2022-12-12 ENCOUNTER — Other Ambulatory Visit (HOSPITAL_COMMUNITY)
Admission: RE | Admit: 2022-12-12 | Discharge: 2022-12-12 | Disposition: A | Discharge status: Home / Self Care | Payer: Medicaid Other | Source: Ambulatory Visit | Attending: Family Medicine | Admitting: Family Medicine

## 2022-12-12 VITALS — BP 115/70 | HR 77 | Ht 65.0 in | Wt 191.0 lb

## 2022-12-12 DIAGNOSIS — N898 Other specified noninflammatory disorders of vagina: Secondary | ICD-10-CM

## 2022-12-12 NOTE — Progress Notes (Signed)
SUBJECTIVE:  27 y.o. female complains of clear vaginal discharge for 3 days. Patient reports rash, has appt with PCP to further discuss. Denies abnormal vaginal bleeding or significant pelvic pain or fever. No UTI symptoms. Denies history of known exposure to STD.  No LMP recorded. Patient has had an implant.  OBJECTIVE:  She appears well, afebrile. Urine dipstick: not done.  ASSESSMENT:  Vaginal Discharge    PLAN:  GC, chlamydia, trichomonas, BVAG, CVAG probe sent to lab. Treatment: To be determined once lab results are received ROV prn if symptoms persist or worsen.

## 2022-12-13 ENCOUNTER — Ambulatory Visit: Payer: Medicaid Other | Admitting: Physician Assistant

## 2022-12-13 LAB — HEPATITIS C ANTIBODY: Hep C Virus Ab: NONREACTIVE

## 2022-12-13 LAB — HEPATITIS B SURFACE ANTIGEN: Hepatitis B Surface Ag: NEGATIVE

## 2022-12-13 LAB — HIV ANTIBODY (ROUTINE TESTING W REFLEX): HIV Screen 4th Generation wRfx: NONREACTIVE

## 2022-12-13 LAB — RPR: RPR Ser Ql: NONREACTIVE

## 2022-12-16 LAB — CERVICOVAGINAL ANCILLARY ONLY
Bacterial Vaginitis (gardnerella): NEGATIVE
Candida Glabrata: NEGATIVE
Candida Vaginitis: POSITIVE — AB
Chlamydia: NEGATIVE
Comment: NEGATIVE
Comment: NEGATIVE
Comment: NEGATIVE
Comment: NEGATIVE
Comment: NEGATIVE
Comment: NORMAL
Neisseria Gonorrhea: NEGATIVE
Trichomonas: NEGATIVE

## 2022-12-16 MED ORDER — MICONAZOLE NITRATE 2 % VA CREA: 1.0000 | TOPICAL_CREAM | Freq: Every day | VAGINAL | 2 refills | Status: DC

## 2022-12-16 NOTE — Addendum Note (Signed)
Addended by: Levie Heritage on: 12/16/2022 01:22 PM   Modules accepted: Orders

## 2022-12-18 ENCOUNTER — Ambulatory Visit (INDEPENDENT_AMBULATORY_CARE_PROVIDER_SITE_OTHER): Payer: Medicaid Other | Admitting: Medical

## 2022-12-18 VITALS — BP 118/76 | HR 82 | Resp 18 | Ht 65.0 in | Wt 195.0 lb

## 2022-12-18 DIAGNOSIS — T7840XD Allergy, unspecified, subsequent encounter: Secondary | ICD-10-CM

## 2022-12-18 DIAGNOSIS — R21 Rash and other nonspecific skin eruption: Secondary | ICD-10-CM | POA: Diagnosis not present

## 2022-12-18 DIAGNOSIS — L299 Pruritus, unspecified: Secondary | ICD-10-CM | POA: Diagnosis not present

## 2022-12-18 NOTE — Patient Instructions (Addendum)
Persistent Rash and Pruritus Rash and itching persisting for 3 weeks, unresponsive to Medrol and partially responsive to Hydroxyzine. Cause unknown. No significant improvement. -Continue Hydroxyzine as needed for itching, up to three times a day. -Implement conservative measures:lower indoor temperature, shorten shower time, use lukewarm water, use Dove moisturizing soap, and apply Vaseline moisturizer. -Referral to Dermatology and Allergy for further evaluation and management.  Follow up in 3 weeks or sooner if needed

## 2022-12-18 NOTE — Progress Notes (Signed)
Subjective:    Patient ID: Melanie Cruz, female    DOB: November 13, 1995, 27 y.o.   MRN: [id number]  HPI  Pt in for follow  up. Last visit hpi.  History of Present Illness  from last visit in " "The patient, with a history of prediabetes and allergic rhinitis, presents with a recent rash and itching. The rash, which started about a week ago, is located on the shoulders, forearms, and hip, with itching also reported on the legs. The patient associates the onset of the rash with the initiation of Xyzal, an antihistamine prescribed for allergic rhinitis symptoms. However, the patient also reports the use of a new [employer] product and a collagen cream on the face around the same time.   The patient's allergic rhinitis symptoms, initially presenting as sinus pressure and nasal congestion a month ago, were treated with Flonase and Xyzal. The patient reports inconsistent use of Xyzal, only starting it a week ago, and uncertain efficacy of Flonase."  Rash still persists despite use of medrol and hydroxyzine.   The patient, with an unidentified rash and associated pruritus, presents for a follow-up visit. The rash, located on the shoulders, forearm, and hip, has been persistent for approximately three weeks. Despite attempts to identify potential exposures, the cause remains unclear.  The patient was previously prescribed a six-day tapered course of Medrol, which unfortunately did not alleviate the symptoms. The rash persisted, and the itching remained largely unaffected. Subsequently, hydroxyzine was prescribed for use three times a day. The patient, however, only used it once daily, typically in the morning. This provided some relief from the itching, but the rash remained unchanged.  The patient reports that the itching is mild and does not interfere with sleep. They have been using their regular lotion and body spray throughout this period. Despite these ongoing symptoms, the patient has  been able to maintain their daily activities.    Review of Systems  Constitutional:  Negative for chills, fatigue and fever.  Respiratory:  Negative for cough, chest tightness, shortness of breath and wheezing.   Cardiovascular:  Negative for chest pain and palpitations.  Gastrointestinal:  Negative for abdominal pain.  Skin:  Positive for rash.  Neurological:  Negative for dizziness, numbness and headaches.  Hematological:  Negative for adenopathy. Does not bruise/bleed easily.   Past Medical History:  Diagnosis Date   COVID-19    Gestational diabetes mellitus (GDM), antepartum 02/21/2021   Migraine      Social History   Socioeconomic History   Marital status: Single    Spouse name: Not on file   Number of children: Not on file   Years of education: Not on file   Highest education level: Not on file  Occupational History   Not on file  Tobacco Use   Smoking status: Every Day    Current packs/day: 0.10    Average packs/day: 0.1 packs/day for 5.0 years (0.5 ttl pk-yrs)    Types: Cigarettes    Passive exposure: Past   Smokeless tobacco: Never   Tobacco comments:    2 cig daily  Vaping Use   Vaping status: Former   Substances: Flavoring  Substance and Sexual Activity   Alcohol use: Not Currently   Drug use: Not Currently    Types: Marijuana    Comment: occasional-last use 05/23/2017   Sexual activity: Yes    Birth control/protection: Implant  Other Topics Concern   Not on file  Social History Narrative  Lives with roommate   Right handed   Caffeine: 0-1 cup/day   Social Determinants of Health   Financial Resource Strain: Low Risk  (08/20/2021)   [facility]   Overall Financial Resource Strain (CARDIA)    Difficulty of Paying Living Expenses: Not very hard  Food Insecurity: Food Insecurity Present (08/20/2021)   Received from [facility]   Hunger Vital Sign  Transportation Needs: No Transportation Needs (08/20/2021)    Received from [facility]   PRAPARE - Transportation  Physical Activity: Insufficiently Active (08/20/2021)   [facility]   Exercise Vital Sign    Days of Exercise per Week: 1 day    Minutes of Exercise per Session: 30 min  Stress: No Stress Concern Present (08/20/2021)   Received from [facility]   [employer] of Occupational Health - Occupational Stress Questionnaire  Social Connections: Moderately Integrated (08/20/2021)   Received from [facility]   Social Connection and Isolation Panel [NHANES]  Intimate Partner Violence: Not At Risk (08/20/2021)   [facility] prior to 03/09/2022., [facility] prior to 03/09/2022.   Humiliation, Afraid, Rape, and Kick questionnaire    Fear of Current or Ex-Partner: No    Emotionally Abused: No    Physically Abused: No    Sexually Abused: No    Past Surgical History:  Procedure Laterality Date   NO PAST SURGERIES      Family History  Problem Relation Age of Onset   Hypertension Mother    Diabetes Mother    Breast cancer Paternal Aunt        age at onset 67's   Migraines Neg Hx     Allergies  Allergen Reactions   Azithromycin Anaphylaxis   Cefdinir Other (See Comments)    Had sensation in throat after taking, was getting over a cold at the time.   Fluconazole Rash    Current Outpatient Medications on File Prior to Visit  Medication Sig Dispense Refill   busPIRone (BUSPAR) 10 MG tablet Take 1 tablet (10 mg total) by mouth 2 (two) times daily. 60 tablet 0   diclofenac Sodium (VOLTAREN) 1 % GEL Apply 2 g topically 4 (four) times daily. 50 g 1   famotidine (PEPCID) 20 MG tablet TAKE 1 TABLET(20 MG) BY MOUTH DAILY 90 tablet 0   fluticasone (FLONASE) 50 MCG/ACT nasal spray SHAKE LIQUID AND USE 2 SPRAYS IN EACH NOSTRIL DAILY 48 g 2   hydrOXYzine (ATARAX) 10 MG tablet Take 1 tablet (10 mg total) by mouth 3 (three) times daily as  needed for itching. 30 tablet 0   meloxicam (MOBIC) 15 MG tablet Take 1 tablet (15 mg total) by mouth daily. 30 tablet 3   metFORMIN (GLUCOPHAGE) 500 MG tablet Take 1 tablet (500 mg total) by mouth 2 (two) times daily with a meal. (Patient not taking: Reported on 11/21/2022) 180 tablet 3   methylPREDNISolone (MEDROL) 4 MG tablet Standard 6 day taper course 21 tablet 0   miconazole (MONISTAT 7) 2 % vaginal cream Place 1 Applicatorful vaginally at bedtime. Apply for seven nights 30 g 2   RETIN-A 0.05 % cream  (Patient not taking: Reported on 11/21/2022)     No current facility-administered medications on file prior to visit.    BP 118/76   Pulse 82   Resp 18   Ht 5\' 5"  (1.651 m)   Wt 195 lb (88.5 kg)  SpO2 100%   BMI 32.45 kg/m        Objective:   Physical Exam  General- No acute distress. Pleasant patient. Neck- Full range of motion, no jvd Lungs- Clear, even and unlabored. Heart- regular rate and rhythm. Neurologic- CNII- XII grossly intact.  Skin- faint rash on shoulder, distal forearm,legs and rt side abdomen.      Assessment & Plan:   Persistent Rash and Pruritus Rash and itching persisting for 3 weeks, unresponsive to Medrol and partially responsive to Hydroxyzine. Cause unknown. No significant improvement. -Continue Hydroxyzine as needed for itching, up to three times a day. -Implement conservative measures:lower indoor temperature, shorten shower time, use lukewarm water, use Dove moisturizing soap, and apply Vaseline moisturizer. -Referral to Dermatology and Allergy for further evaluation and management.  Follow up in 3 weeks or sooner if needed  [employer], PA-C

## 2023-01-15 ENCOUNTER — Ambulatory Visit (INDEPENDENT_AMBULATORY_CARE_PROVIDER_SITE_OTHER): Payer: Self-pay

## 2023-01-15 ENCOUNTER — Other Ambulatory Visit (HOSPITAL_COMMUNITY)
Admission: RE | Admit: 2023-01-15 | Discharge: 2023-01-15 | Disposition: A | Discharge status: Home / Self Care | Payer: Medicaid Other | Source: Ambulatory Visit | Attending: Obstetrics & Gynecology | Admitting: Obstetrics & Gynecology

## 2023-01-15 ENCOUNTER — Other Ambulatory Visit: Payer: Self-pay | Admitting: Medical

## 2023-01-15 DIAGNOSIS — Z113 Encounter for screening for infections with a predominantly sexual mode of transmission: Secondary | ICD-10-CM

## 2023-01-15 DIAGNOSIS — N898 Other specified noninflammatory disorders of vagina: Secondary | ICD-10-CM | POA: Diagnosis not present

## 2023-01-15 NOTE — Progress Notes (Signed)
 SUBJECTIVE:  28 y.o. female complains of clear vaginal discharge. Denies abnormal vaginal bleeding or significant pelvic pain or fever. No UTI symptoms. Denies history of known exposure to STD.  No LMP recorded. Patient has had an implant.  OBJECTIVE:  She appears well, afebrile. Urine dipstick: not done.  ASSESSMENT:  Vaginal Discharge     PLAN:  GC, chlamydia, trichomonas, BVAG, CVAG probe sent to lab. Treatment: To be determined once lab results are received ROV prn if symptoms persist or worsen.

## 2023-01-16 MED ORDER — FAMOTIDINE 20 MG PO TABS: 20.0000 mg | ORAL_TABLET | Freq: Every day | ORAL | 1 refills | Status: AC

## 2023-01-17 LAB — CERVICOVAGINAL ANCILLARY ONLY
Bacterial Vaginitis (gardnerella): POSITIVE — AB
Candida Glabrata: NEGATIVE
Candida Vaginitis: NEGATIVE
Chlamydia: NEGATIVE
Comment: NEGATIVE
Comment: NEGATIVE
Comment: NEGATIVE
Comment: NEGATIVE
Comment: NEGATIVE
Comment: NORMAL
Neisseria Gonorrhea: NEGATIVE
Trichomonas: NEGATIVE

## 2023-01-21 ENCOUNTER — Other Ambulatory Visit: Payer: Self-pay | Admitting: Family Medicine

## 2023-01-21 MED ORDER — METRONIDAZOLE 1.3 % VA GEL: 1.0000 | Freq: Every day | VAGINAL | 0 refills | Status: AC

## 2023-01-24 ENCOUNTER — Other Ambulatory Visit: Payer: Self-pay | Admitting: Family Medicine

## 2023-01-24 MED ORDER — METRONIDAZOLE 0.75 % VA GEL: 1.0000 | Freq: Every day | VAGINAL | 1 refills | Status: DC

## 2023-01-28 ENCOUNTER — Encounter: Payer: Self-pay | Admitting: Family Medicine

## 2023-01-28 ENCOUNTER — Encounter: Payer: Self-pay | Admitting: Medical

## 2023-01-28 ENCOUNTER — Ambulatory Visit (INDEPENDENT_AMBULATORY_CARE_PROVIDER_SITE_OTHER): Payer: BC Managed Care – PPO | Admitting: Medical

## 2023-01-28 VITALS — BP 126/66 | HR 74 | Resp 18 | Ht 65.0 in | Wt 197.0 lb

## 2023-01-28 DIAGNOSIS — R079 Chest pain, unspecified: Secondary | ICD-10-CM | POA: Diagnosis not present

## 2023-01-28 DIAGNOSIS — E785 Hyperlipidemia, unspecified: Secondary | ICD-10-CM

## 2023-01-28 DIAGNOSIS — K219 Gastro-esophageal reflux disease without esophagitis: Secondary | ICD-10-CM

## 2023-01-28 DIAGNOSIS — R0789 Other chest pain: Secondary | ICD-10-CM

## 2023-01-28 LAB — TROPONIN I: Troponin I: 5 ng/L (ref <=47)

## 2023-01-28 MED ORDER — OMEPRAZOLE 20 MG PO CPDR: 20.0000 mg | DELAYED_RELEASE_CAPSULE | Freq: Every day | ORAL | 3 refills | Status: DC

## 2023-01-28 MED ORDER — BUPROPION HCL ER (XL) 150 MG PO TB24: 150.0000 mg | ORAL_TABLET | Freq: Every day | ORAL | 1 refills | Status: DC

## 2023-01-28 MED ORDER — AZELASTINE HCL 0.1 % NA SOLN: 2.0000 | Freq: Two times a day (BID) | NASAL | 12 refills | Status: AC

## 2023-01-28 NOTE — Progress Notes (Signed)
Subjective:    Patient ID: Melanie Cruz, female    DOB: 1996/01/01, 28 y.o.   MRN: [id number]  HPI Discussed the use of AI scribe software for clinical note transcription with the patient, who gave verbal consent to proceed.  History of Present Illness   The patient, with a history of high cholesterol and smoking, presented with intermittent chest discomfort. The discomfort, described as sharp, was first noticed after consuming three beers on a Saturday night. The patient reported that belching seemed to relieve the discomfort. The patient denied any similar episodes in the past. She also reported transient, non-painful sensations in the chest at work, lasting only a few seconds. The patient could not recall any associated jaw pain, shoulder pain, or arm pain during these episodes.  The patient has been taking famotidine along with metformin daily. She admitted to not adhering to advised dietary modifications, including reducing coffee, beer, and fried food intake. The patient reported smoking five cigarettes a day for approximately 15 years, although she is currently trying to reduce this. She is willing to try wellbutrin.  The patient also reported an intermittent pattern of nasal congestion with occasional postnasal drainage, which worsened with rapid changes in temperature.        Past Medical History:  Diagnosis Date   COVID-19    Gestational diabetes mellitus (GDM), antepartum 02/21/2021   Migraine      Social History   Socioeconomic History   Marital status: Single    Spouse name: Not on file   Number of children: Not on file   Years of education: Not on file   Highest education level: Not on file  Occupational History   Not on file  Tobacco Use   Smoking status: Every Day    Current packs/day: 0.10    Average packs/day: 0.1 packs/day for 5.0 years (0.5 ttl pk-yrs)    Types: Cigarettes    Passive exposure: Past   Smokeless tobacco: Never   Tobacco comments:    2  cig daily  Vaping Use   Vaping status: Former   Substances: Flavoring  Substance and Sexual Activity   Alcohol use: Not Currently   Drug use: Not Currently    Types: Marijuana    Comment: occasional-last use 05/23/2017   Sexual activity: Yes    Birth control/protection: Implant  Other Topics Concern   Not on file  Social History Narrative   Lives with roommate   Right handed   Caffeine: 0-1 cup/day   Social Drivers of Health   Financial Resource Strain: Low Risk  (08/20/2021)   [facility]   Overall Financial Resource Strain (CARDIA)    Difficulty of Paying Living Expenses: Not very hard  Food Insecurity: Food Insecurity Present (08/20/2021)   Received from [facility]   Hunger Vital Sign  Transportation Needs: No Transportation Needs (08/20/2021)   Received from [facility]   PRAPARE - Transportation  Physical Activity: Insufficiently Active (08/20/2021)   [facility]   Exercise Vital Sign    Days of Exercise per Week: 1 day    Minutes of Exercise per Session: 30 min  Stress: No Stress Concern Present (08/20/2021)   Received from [facility]   [employer] of Occupational Health - Occupational Stress Questionnaire  Social Connections: Moderately Integrated (08/20/2021)   Received from [facility]   Social Connection and Isolation Panel [NHANES]  Intimate Partner Violence: Not At Risk (08/20/2021)   [facility]  [facility] prior to 03/09/2022., [facility] visits prior to 03/09/2022.   Humiliation, Afraid, Rape, and Kick questionnaire    Fear of Current or Ex-Partner: No    Emotionally Abused: No    Physically Abused: No    Sexually Abused: No    Past Surgical History:  Procedure Laterality Date   NO PAST SURGERIES      Family History  Problem Relation Age of Onset   Hypertension Mother    Diabetes Mother    Breast cancer Paternal Aunt         age at onset 70's   Migraines Neg Hx     Allergies  Allergen Reactions   Azithromycin Anaphylaxis   Cefdinir Other (See Comments)    Had sensation in throat after taking, was getting over a cold at the time.   Fluconazole Rash    Current Outpatient Medications on File Prior to Visit  Medication Sig Dispense Refill   famotidine (PEPCID) 20 MG tablet Take 1 tablet (20 mg total) by mouth daily. 90 tablet 1   busPIRone (BUSPAR) 10 MG tablet Take 1 tablet (10 mg total) by mouth 2 (two) times daily. 60 tablet 0   diclofenac Sodium (VOLTAREN) 1 % GEL Apply 2 g topically 4 (four) times daily. 50 g 1   fluticasone (FLONASE) 50 MCG/ACT nasal spray SHAKE LIQUID AND USE 2 SPRAYS IN EACH NOSTRIL DAILY 48 g 2   hydrOXYzine (ATARAX) 10 MG tablet Take 1 tablet (10 mg total) by mouth 3 (three) times daily as needed for itching. 30 tablet 0   meloxicam (MOBIC) 15 MG tablet Take 1 tablet (15 mg total) by mouth daily. 30 tablet 3   metFORMIN (GLUCOPHAGE) 500 MG tablet Take 1 tablet (500 mg total) by mouth 2 (two) times daily with a meal. (Patient not taking: Reported on 11/21/2022) 180 tablet 3   methylPREDNISolone (MEDROL) 4 MG tablet Standard 6 day taper course 21 tablet 0   metroNIDAZOLE (METROGEL) 0.75 % vaginal gel Place 1 Applicatorful vaginally at bedtime. Apply one applicatorful to vagina at bedtime for 5 days 70 g 1   miconazole (MONISTAT 7) 2 % vaginal cream Place 1 Applicatorful vaginally at bedtime. Apply for seven nights 30 g 2   RETIN-A 0.05 % cream  (Patient not taking: Reported on 11/21/2022)     No current facility-administered medications on file prior to visit.    BP 126/66   Pulse 74   Resp 18   Ht 5\' 5"  (1.651 m)   Wt 197 lb (89.4 kg)   SpO2 100%   BMI 32.78 kg/m     Review of Systems See hpi    Objective:   Physical Exam  General- No acute distress. Pleasant patient. Neck- Full range of motion, no jvd Lungs- Clear, even and unlabored. Heart- regular rate and  rhythm. Neurologic- CNII- XII grossly intact.  Abdomen- soft, nt, nd, +bs, no rebound or guarding. Back- no cva tenderness.      Assessment & Plan:   Patient Instructions  Atypical Chest Pain Recent onset of sharp, transient chest pain, likely related to gastroesophageal reflux disease (GERD) given the association with alcohol intake and relief with belching. No associated symptoms suggestive of acute coronary syndrome. EKG showed sinus rhythm with no ischemic changes. -Order troponin for safety given the patient's smoking history and hyperlipidemia. -Continue famotidine 20mg  daily. -Add omeprazole for additional reflux control. -Advise dietary modifications including limiting coffee, alcohol, and fried foods. -  Plan for follow-up in two weeks to assess response to treatment.  Hyperlipidemia History of elevated cholesterol, currently not on lipid-lowering therapy. -Order fasting lipid panel in two weeks. -Discuss smoking cessation at next visit given increased cardiovascular risk.  Nasal Congestion Intermittent pattern of nasal congestion and postnasal drainage, worse with temperature changes, suggestive of vasomotor rhinitis. -Prescribe Azelastine nasal spray.  follow up in 2 weeks or sooner if needed

## 2023-01-28 NOTE — Patient Instructions (Signed)
Atypical Chest Pain Recent onset of sharp, transient chest pain, likely related to gastroesophageal reflux disease (GERD) given the association with alcohol intake and relief with belching. No associated symptoms suggestive of acute coronary syndrome. EKG showed sinus rhythm with no ischemic changes. -Order troponin for safety given the patient's smoking history and hyperlipidemia. -Continue famotidine 20mg  daily. -Add omeprazole for additional reflux control. -Advise dietary modifications including limiting coffee, alcohol, and fried foods. -Plan for follow-up in two weeks to assess response to treatment.  Hyperlipidemia History of elevated cholesterol, currently not on lipid-lowering therapy. -Order fasting lipid panel in two weeks. -Discuss smoking cessation at next visit given increased cardiovascular risk.  Nasal Congestion Intermittent pattern of nasal congestion and postnasal drainage, worse with temperature changes, suggestive of vasomotor rhinitis. -Prescribe Azelastine nasal spray.  follow up in 2 weeks or sooner if needed

## 2023-02-04 ENCOUNTER — Encounter: Payer: Self-pay | Admitting: Internal Medicine

## 2023-02-04 ENCOUNTER — Ambulatory Visit: Payer: BC Managed Care – PPO | Admitting: Internal Medicine

## 2023-02-04 VITALS — BP 104/68 | HR 90 | Temp 98.1°F | Resp 17 | Ht 64.2 in | Wt 197.7 lb

## 2023-02-04 DIAGNOSIS — Z720 Tobacco use: Secondary | ICD-10-CM

## 2023-02-04 DIAGNOSIS — J310 Chronic rhinitis: Secondary | ICD-10-CM

## 2023-02-04 DIAGNOSIS — L501 Idiopathic urticaria: Secondary | ICD-10-CM | POA: Diagnosis not present

## 2023-02-04 DIAGNOSIS — L308 Other specified dermatitis: Secondary | ICD-10-CM

## 2023-02-04 DIAGNOSIS — Z8709 Personal history of other diseases of the respiratory system: Secondary | ICD-10-CM

## 2023-02-04 DIAGNOSIS — R21 Rash and other nonspecific skin eruption: Secondary | ICD-10-CM | POA: Diagnosis not present

## 2023-02-04 NOTE — Progress Notes (Signed)
NEW PATIENT Date of Service/Encounter:  02/04/23 Referring provider: Marisue Cruz Primary care provider: Marisue Cruz  Subjective:  Melanie Cruz is a 28 y.o. female  presenting today for evaluation of rashes  History obtained from: chart review and patient.   Discussed the use of AI scribe software for clinical note transcription with the patient, who gave verbal consent to proceed.  History of Present Illness   The patient presents with recurrent rashes and possible allergic reactions. Patient was difficult historian and could not recall many details of history.   The patient has experienced recurrent rashes, initially presenting with dark blisters on fingers and toes after visiting a nail shop early last year. An evaluation by an autoimmune specialist showed no abnormalities. The initial rash was treated with steroids and Benadryl in the emergency room, providing relief. She has since avoided Diflucan, which was associated with the first reaction.  A subsequent episode occurred after consuming Chipotle, characterized by sore teeth on the left side, suspected to be another allergic reaction. A seven-day course of steroids alleviated the symptoms, though the exact timing of symptoms post-meal is unclear. The most recent rash appeared as a large rash on the chest and neck, differing from previous blistering episodes. One blistering episode occurred during pregnancy, which she associates with hormonal changes.  She has a history of childhood asthma, which she feels she has outgrown, but reports occasional wheezing and a scratchy throat about a year ago. No recent inhaler use. She experiences seasonal allergies, primarily presenting as a stuffy nose, for which she uses azelastine nasal spray, finding it effective in improving her sense of smell.  No fever or oral involvement. She experiences reflux and is on medication for it, which she finds somewhat effective.         Chart Review:  Ed visit 11/24/21: 28 year old female presents to ED for evaluation of rash starting earlier this evening. Patient reports she started taking Diflucan. Notes that she began to have itching to the hands and feet. Patient does have wheals present on evaluation. At this time, patient is not endorsing any shortness of breath, drooling or dyspnea. Discussed with patient that we will discontinue her Diflucan and start her on a course of clotrimazole. Patient is also advised that we will give her a dose of steroids and Benadryl here. She is encouraged to continue use of Benadryl at home. Advised to follow-up closely with her primary care provider and return to the ED for any new or worsening symptoms including any difficulties with breathing or swallowing. Patient is agreeable with plan of care.   Other allergy screening: Asthma: yes as a child,  Rhino conjunctivitis: yes Food allergy: no Medication allergy: yes Hymenoptera allergy: no Urticaria: yes Eczema:no History of recurrent infections suggestive of immunodeficency: no Vaccinations are up to date.   Past Medical History: Past Medical History:  Diagnosis Date   COVID-19    Eczema    Gestational diabetes mellitus (GDM), antepartum 02/21/2021   Migraine    Medication List:  Current Outpatient Medications  Medication Sig Dispense Refill   azelastine (ASTELIN) 0.1 % nasal spray Place 2 sprays into both nostrils 2 (two) times daily. Use in each nostril as directed 30 mL 12   buPROPion (WELLBUTRIN XL) 150 MG 24 hr tablet Take 1 tablet (150 mg total) by mouth daily. 30 tablet 1   busPIRone (BUSPAR) 10 MG tablet Take 1 tablet (10 mg total) by mouth 2 (two) times daily. 60 tablet 0  famotidine (PEPCID) 20 MG tablet Take 1 tablet (20 mg total) by mouth daily. 90 tablet 1   fluticasone (FLONASE) 50 MCG/ACT nasal spray SHAKE LIQUID AND USE 2 SPRAYS IN EACH NOSTRIL DAILY 48 g 2   meloxicam (MOBIC) 15 MG tablet Take 1 tablet  (15 mg total) by mouth daily. 30 tablet 3   metFORMIN (GLUCOPHAGE) 500 MG tablet Take 1 tablet (500 mg total) by mouth 2 (two) times daily with a meal. 180 tablet 3   omeprazole (PRILOSEC) 20 MG capsule Take 1 capsule (20 mg total) by mouth daily. 30 capsule 3   RETIN-A 0.05 % cream      diclofenac Sodium (VOLTAREN) 1 % GEL Apply 2 g topically 4 (four) times daily. (Patient not taking: Reported on 02/04/2023) 50 g 1   hydrOXYzine (ATARAX) 10 MG tablet Take 1 tablet (10 mg total) by mouth 3 (three) times daily as needed for itching. (Patient not taking: Reported on 02/04/2023) 30 tablet 0   methylPREDNISolone (MEDROL) 4 MG tablet Standard 6 day taper course (Patient not taking: Reported on 02/04/2023) 21 tablet 0   metroNIDAZOLE (METROGEL) 0.75 % vaginal gel Place 1 Applicatorful vaginally at bedtime. Apply one applicatorful to vagina at bedtime for 5 days (Patient not taking: Reported on 02/04/2023) 70 g 1   miconazole (MONISTAT 7) 2 % vaginal cream Place 1 Applicatorful vaginally at bedtime. Apply for seven nights (Patient not taking: Reported on 02/04/2023) 30 g 2   No current facility-administered medications for this visit.   Known Allergies:  Allergies  Allergen Reactions   Azithromycin Anaphylaxis   Cefdinir Other (See Comments)    Had sensation in throat after taking, was getting over a cold at the time.   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  Objective:  Blood pressure 104/68, pulse 90, temperature 98.1 F (36.7 C), temperature source Temporal, resp. rate 17, height 5' 4.2" (1.631 m), weight 197 lb 11.2 oz (89.7 kg), SpO2 100%. Body mass index is 33.72 kg/m. Physical Exam:  General Appearance:  Alert, cooperative, no distress, appears stated age  Head:  Normocephalic, without obvious abnormality, atraumatic  Eyes:  Conjunctiva clear, EOM's intact  Ears EACs normal bilaterally  Nose: Nares normal, normal mucosa, no visible anterior polyps, and septum midline  Throat: Lips, tongue normal; teeth and gums normal, normal posterior oropharynx  Neck: Supple, symmetrical  Lungs:   clear to auscultation bilaterally, Respirations unlabored, no coughing  Heart:  regular rate and rhythm and no murmur, Appears well perfused  Extremities: No edema  Skin: Skin color, texture, turgor normal and no rashes or lesions on visualized portions of skin  Neurologic: No gross deficits   Diagnostics: None done   Labs:  Lab Orders  Chronic Urticaria         Tryptase         TSH         Alpha-Gal Panel         C-reactive protein         CBC With Diff/Platelet         CMP14+EGFR       Assessment and Plan  Assessment and Plan    Recurrent Rashes Patient reports two distinct types of rashes within the past year. The first type involved blisters on fingers and toes, possibly associated with a visit to a nail salon. The second type was a large rash on the chest and neck. The patient has been avoiding Diflucan due to a suspected allergy. -Request patient to find and provide pictures of the rashes for further assessment. -Order labs to investigate potential causes of hives, although unclear if rash is consistent with hives versus other dermatitis  -Schedule allergy testing for next week (1-68) -Advise patient to avoid Diflucan strictly given  blistering symtpoms and concern for possible SCAR   Gastroesophageal Reflux Disease (GERD) Patient reports symptoms of acid reflux, currently managed with omeprazole and famotidine. -Continue omeprazole as prescribed  -Hold famotidine three days prior to allergy testing.  Seasonal Allergies Patient reports possible seasonal allergies with symptoms of stuffy nose. Currently using azelastine nasal spray with some relief. -Continue azelastine nasal spray 1 spray per nostril twice daily  - Continue flonase 1 spray per nostril twice daily as needed  - Follow up for allergy testing (1-68)   Follow-up -Schedule follow-up appointment for allergy testing next week. -Advise patient to avoid certain medications prior to allergy testing. -Request patient to provide pictures of previous rashes at next appointment.          This note in its entirety was forwarded to the Provider who requested this consultation.  Other:     Thank you for your kind referral. I appreciate the opportunity to take part in Melanie Cruz's care. Please do not hesitate to contact me with questions.  Sincerely,  Thank you so much for letting me partake in your care today.  Don't hesitate to reach out if you have any additional concerns!  Ferol Luz, MD  [facility] [place] [place]

## 2023-02-11 ENCOUNTER — Ambulatory Visit: Payer: BC Managed Care – PPO | Admitting: Internal Medicine

## 2023-02-11 ENCOUNTER — Ambulatory Visit: Payer: BC Managed Care – PPO | Admitting: Medical

## 2023-02-11 DIAGNOSIS — L501 Idiopathic urticaria: Secondary | ICD-10-CM | POA: Diagnosis not present

## 2023-02-11 DIAGNOSIS — J3089 Other allergic rhinitis: Secondary | ICD-10-CM

## 2023-02-11 DIAGNOSIS — L308 Other specified dermatitis: Secondary | ICD-10-CM

## 2023-02-11 LAB — ALPHA-GAL PANEL
Allergen Lamb IgE: <0.1 kU/L
Beef IgE: <0.1 kU/L
IgE (Immunoglobulin E), Serum: 91 [IU]/mL (ref 6–495)
O215-IgE Alpha-Gal: <0.1 kU/L
Pork IgE: <0.1 kU/L

## 2023-02-11 LAB — CBC WITH DIFF/PLATELET
Basophils Absolute: 0 10*3/uL (ref 0.0–0.2)
Basos: 0 %
EOS (ABSOLUTE): 0.1 10*3/uL (ref 0.0–0.4)
Eos: 2 %
Hematocrit: 38 % (ref 34.0–46.6)
Hemoglobin: 11.9 g/dL (ref 11.1–15.9)
Immature Grans (Abs): 0 10*3/uL (ref 0.0–0.1)
Immature Granulocytes: 0 %
Lymphocytes Absolute: 2.2 10*3/uL (ref 0.7–3.1)
Lymphs: 42 %
MCH: 25.8 pg — ABNORMAL LOW (ref 26.6–33.0)
MCHC: 31.3 g/dL — ABNORMAL LOW (ref 31.5–35.7)
MCV: 82 fL (ref 79–97)
Monocytes Absolute: 0.3 10*3/uL (ref 0.1–0.9)
Monocytes: 6 %
Neutrophils Absolute: 2.5 10*3/uL (ref 1.4–7.0)
Neutrophils: 50 %
Platelets: 202 10*3/uL (ref 150–450)
RBC: 4.61 x10E6/uL (ref 3.77–5.28)
RDW: 13.4 % (ref 11.7–15.4)
WBC: 5.2 10*3/uL (ref 3.4–10.8)

## 2023-02-11 LAB — CMP14+EGFR
ALT: 16 [IU]/L (ref 0–32)
AST: 14 [IU]/L (ref 0–40)
Albumin: 4.1 g/dL (ref 4.0–5.0)
Alkaline Phosphatase: 43 [IU]/L — ABNORMAL LOW (ref 44–121)
BUN/Creatinine Ratio: 15 (ref 9–23)
BUN: 9 mg/dL (ref 6–20)
Bilirubin Total: <0.2 mg/dL (ref 0.0–1.2)
CO2: 21 mmol/L (ref 20–29)
Calcium: 8.9 mg/dL (ref 8.7–10.2)
Chloride: 106 mmol/L (ref 96–106)
Creatinine, Ser: 0.6 mg/dL (ref 0.57–1.00)
Globulin, Total: 2.4 g/dL (ref 1.5–4.5)
Glucose: 81 mg/dL (ref 70–99)
Potassium: 4.1 mmol/L (ref 3.5–5.2)
Sodium: 140 mmol/L (ref 134–144)
Total Protein: 6.5 g/dL (ref 6.0–8.5)
eGFR: 125 mL/min/{1.73_m2} (ref >59)

## 2023-02-11 LAB — TSH: TSH: 0.977 u[IU]/mL (ref 0.450–4.500)

## 2023-02-11 LAB — C-REACTIVE PROTEIN: CRP: 3 mg/L (ref 0–10)

## 2023-02-11 LAB — CHRONIC URTICARIA: cu index: 6.3 (ref <10)

## 2023-02-11 LAB — TRYPTASE: Tryptase: 5.4 ug/L (ref 2.2–13.2)

## 2023-02-11 NOTE — Patient Instructions (Addendum)
 Recurrent Rashes Patient reports two distinct types of rashes within the past year. The first type involved blisters on fingers and toes, possibly associated with a visit to a nail salon. The second type was a large rash on the chest and neck. The patient has been avoiding Diflucan  due to a suspected allergy . -Request patient to find and provide pictures of previous and any future rashes for further assessment. - Labs: were all normal  - Allergy  test (02/11/23): negative  -Advise patient to avoid Diflucan  strictly given blistering symtpoms and concern for possible SCAR   Gastroesophageal Reflux Disease (GERD) Patient reports symptoms of acid reflux, currently managed with omeprazole  and famotidine . -Continue omeprazole  as prescribed  -   Nonallergic Rhinitis  Patient reports possible seasonal allergies with symptoms of stuffy nose. Currently using azelastine  nasal spray with some relief. -Continue azelastine  nasal spray 1 spray per nostril twice daily  - Continue flonase  1 spray per nostril twice daily as needed   Follow-up: 6 months

## 2023-02-11 NOTE — Progress Notes (Signed)
 Date of Service/Encounter:  02/11/23  Allergy  testing appointment   Initial visit on 02/04/23, seen for drug rash, urticaria, rhinitis .  Please see that note for additional details.  Today reports for allergy  diagnostic testing:    DIAGNOSTICS:  Skin Testing: Environmental allergy  panel and select foods. Adequate positive and negative controls Results discussed with patient/family.  Airborne Adult Perc - 02/11/23 1104     Time Antigen Placed 1110    Allergen Manufacturer Jestine    Location Back    Number of Test 55    1. Control-Buffer 50% Glycerol Negative    2. Control-Histamine 3+    3. Bahia Negative    4. Bermuda Negative    5. Johnson Negative    6. Kentucky  Blue Negative    7. Meadow Fescue Negative    8. Perennial Rye Negative    9. Timothy Negative    10. Ragweed Mix Negative    11. Cocklebur Negative    12. Plantain,  English Negative    13. Baccharis Negative    14. Dog Fennel Negative    15. Russian Thistle Negative    16. Lamb's Quarters Negative    17. Sheep Sorrell Negative    18. Rough Pigweed Negative    19. Marsh Elder, Rough Negative    20. Mugwort, Common Negative    21. Box, Elder Negative    22. Cedar, red Negative    23. Sweet Gum Negative    24. Pecan Pollen Negative    25. Pine Mix Negative    26. Walnut, Black Pollen Negative    27. Red Mulberry Negative    28. Ash Mix Negative    29. Birch Mix Negative    30. Beech American Negative    31. Cottonwood, Eastern Negative    32. Hickory, White Negative    33. Maple Mix Negative    34. Oak, Eastern Mix Negative    35. Sycamore Eastern Negative    36. Alternaria Alternata Negative    37. Cladosporium Herbarum Negative    38. Aspergillus Mix Negative    39. Penicillium Mix Negative    40. Bipolaris Sorokiniana (Helminthosporium) Negative    41. Drechslera Spicifera (Curvularia) Negative    42. Mucor Plumbeus Negative    43. Fusarium Moniliforme Negative    44. Aureobasidium Pullulans  (pullulara) Negative    45. Rhizopus Oryzae Negative    46. Botrytis Cinera Negative    47. Epicoccum Nigrum Negative    48. Phoma Betae Negative    49. Dust Mite Mix Negative    50. Cat Hair 10,000 BAU/ml Negative    51.  Dog Epithelia Negative    52. Mixed Feathers Negative    53. Horse Epithelia Negative    54. Cockroach, German Negative    55. Tobacco Leaf Negative             13 Food Perc - 02/11/23 1104       Test Information   Time Antigen Placed 1110    Allergen Manufacturer Jestine    Location Back    Number of allergen test 13      Food   1. Peanut Negative    2. Soybean Negative    3. Wheat Negative    4. Sesame Negative    5. Milk, Cow Negative    6. Casein Negative    7. Egg White, Chicken Negative    8. Shellfish Mix Negative    9. Fish Mix Negative  10. Cashew Negative    11. Walnut Food Negative    12. Almond Negative    13. Hazelnut Negative             Intradermal - 02/11/23 1137     Time Antigen Placed 1137    Allergen Manufacturer Jestine    Location Arm    Number of Test 16    Control 3+    Bahia Negative    Bermuda Negative    Johnson Negative    7 Grass Negative    Ragweed Mix Negative    Weed Mix Negative    Tree Mix Negative    Mold 1 Negative    Mold 2 Negative    Mold 3 Negative    Mold 4 Negative    Mite Mix Negative    Cat Negative    Dog Negative             Allergy  testing results were read and interpreted by myself, documented by clinical staff.  Patient provided with copy of allergy  testing along with avoidance measures when indicated.   Hargis Springer, MD  [facility] [place] 

## 2023-02-12 ENCOUNTER — Ambulatory Visit: Payer: BC Managed Care – PPO | Admitting: Medical

## 2023-02-18 ENCOUNTER — Encounter: Payer: Self-pay | Admitting: Medical

## 2023-02-20 ENCOUNTER — Encounter: Payer: Self-pay | Admitting: Medical

## 2023-02-20 ENCOUNTER — Ambulatory Visit: Payer: BC Managed Care – PPO | Admitting: Medical

## 2023-02-20 VITALS — BP 110/74 | HR 74 | Resp 18 | Ht 64.0 in | Wt 196.6 lb

## 2023-02-20 DIAGNOSIS — R519 Headache, unspecified: Secondary | ICD-10-CM | POA: Diagnosis not present

## 2023-02-20 DIAGNOSIS — M26609 Unspecified temporomandibular joint disorder, unspecified side: Secondary | ICD-10-CM

## 2023-02-20 DIAGNOSIS — H6122 Impacted cerumen, left ear: Secondary | ICD-10-CM | POA: Diagnosis not present

## 2023-02-20 LAB — SEDIMENTATION RATE: Sed Rate: 21 mm/h — ABNORMAL HIGH (ref 0–20)

## 2023-02-20 NOTE — Progress Notes (Signed)
Subjective:    Patient ID: Melanie Cruz, female    DOB: [birth year], 28 y.o.   MRN: [id number]  HPI Discussed the use of AI scribe software for clinical note transcription with the patient, who gave verbal consent to proceed.  History of Present Illness   Melanie Cruz is a 28 year old female who presents with jaw and ear pain.  She has been experiencing pain in the jaw area, specifically around the temporomandibular joint (TMJ) on both sides, with the left side being more painful than the right. This pain has been present for approximately four to five days. Additionally, there is pain in both ears, more pronounced on the left side, which started around the same time. The pain is exacerbated by chewing, and she occasionally hears a clicking sound on the right side when opening her mouth.  She has a habit of chewing gum frequently, which she started about a month or two ago while at work to avoid eating. This habit has led to increased jaw pain, especially when chewing gum or food.  There is some wax accumulation in her left ear, though a small portion of the eardrum is visible and appears normal. She has not used the ear wax softening drops previously recommended and has not experienced significant pain when pressure is applied to the tragus.  Her current medications include bupropion (Wellbutrin XL) for smoking cessation and buspirone (Buspar) for anxiety. She smokes and is using bupropion to help quit smoking. She expresses concern about potential interactions with alcohol.        Review of Systems See hpi    Objective:   Physical Exam  General Mental Status- Alert. General Appearance- Not in acute distress.   Heent- no sinus pressure on exam. Left canal blocked with wax. Only small portion of tm seen but normal. Post lavage the same. Rt side canal clear and normal tm. On exam presently no tmj pain presently. No crepitus.  Skin General: Color- Normal Color. Moisture- Normal  Moisture.  Neck Carotid Arteries- Normal color. Moisture- Normal Moisture. No carotid bruits. No JVD.  Chest and Lung Exam Auscultation: Breath Sounds:-Normal.  Cardiovascular Auscultation:Rythm- Regular. Murmurs & Other Heart Sounds:Auscultation of the heart reveals- No Murmurs.  Abdomen Inspection:-Inspeection Normal. Palpation/Percussion:Note:No mass. Palpation and Percussion of the abdomen reveal- Non Tender, Non Distended + BS, no rebound or guarding.   Neurologic Cranial Nerve exam:- CN III-XII intact(No nystagmus), symmetric smile. Strength:- 5/5 equal and symmetric strength both upper and lower extremities.       Assessment & Plan:   Temporomandibular Joint (TMJ) Syndrome Pain in the temporal mandibular joint area on both sides, more on the left than the right, for the past 4-5 days. Pain exacerbated by chewing gum. No significant pain on opening and closing mouth. Mild clicking sound noticed by the patient. -Stop chewing gum and avoid hard, crunchy foods. -Consider low-dose ibuprofen for pain relief. --since some temporal area pain as well at times got sed rate.  Ear Pain Left ear minimal discomfort for the past 4-5 days. Wax build-up noted in the ear canal. Small portion of eardrum visible and appears normal. No pain on pressing the tragus. No signs of ear canal swelling. -Use Debrox to soften and potentially remove wax. -If pain becomes constant, throbbing, and high level, consider antibiotic treatment and schedule appointment for wax removal. 5-7 days post potential antibiotic if that were to occur.  Anxiety Overthinking and anxiety noted. Currently prescribed buspirone (Buspar) for anxiety and  bupropion (Wellbutrin) for smoking cessation. -Continue buspirone for anxiety. Check with pharmacy regarding potential alcohol interaction. -Continue bupropion for smoking cessation.  Follow up date to be determined after my chart update in 2-3 weeks.

## 2023-02-20 NOTE — Patient Instructions (Addendum)
Temporomandibular Joint (TMJ) Syndrome Pain in the temporal mandibular joint area on both sides, more on the left than the right, for the past 4-5 days. Pain exacerbated by chewing gum. No significant pain on opening and closing mouth. Mild clicking sound noticed by the patient. -Stop chewing gum and avoid hard, crunchy foods. -Consider low-dose ibuprofen for pain relief. -since some temporal area pain as well at times got sed rate.  Ear Pain Left ear minimal discomfort for the past 4-5 days. Wax build-up noted in the ear canal. Small portion of eardrum visible and appears normal. No pain on pressing the tragus. No signs of ear canal swelling. -Use Debrox to soften and potentially remove wax. -If pain becomes constant, throbbing, and high level, consider antibiotic treatment and schedule appointment for wax removal. 5-7 days post potential antibiotic if that were to occur.  Anxiety Overthinking and anxiety noted. Currently prescribed buspirone (Buspar) for anxiety and bupropion (Wellbutrin) for smoking cessation. -Continue buspirone for anxiety. Check with pharmacy regarding potential alcohol interaction. -Continue bupropion for smoking cessation.  Follow up date to be determined after my chart update in 2-3 weeks.

## 2023-04-01 ENCOUNTER — Other Ambulatory Visit (HOSPITAL_COMMUNITY)
Admission: RE | Admit: 2023-04-01 | Discharge: 2023-04-01 | Disposition: A | Discharge status: Home / Self Care | Source: Ambulatory Visit

## 2023-04-01 ENCOUNTER — Ambulatory Visit

## 2023-04-01 VITALS — BP 120/77 | HR 91 | Ht 65.0 in | Wt 206.0 lb

## 2023-04-01 DIAGNOSIS — Z113 Encounter for screening for infections with a predominantly sexual mode of transmission: Secondary | ICD-10-CM | POA: Diagnosis not present

## 2023-04-01 DIAGNOSIS — B9689 Other specified bacterial agents as the cause of diseases classified elsewhere: Secondary | ICD-10-CM | POA: Diagnosis not present

## 2023-04-01 DIAGNOSIS — N760 Acute vaginitis: Secondary | ICD-10-CM | POA: Diagnosis not present

## 2023-04-01 NOTE — Progress Notes (Signed)
 SUBJECTIVE:  28 y.o. female who desires a STI screen. Denies abnormal vaginal discharge, bleeding or significant pelvic pain. No UTI symptoms. Denies history of known exposure to STD.  Patient's last menstrual period was 03/05/2023 (exact date).  OBJECTIVE:  She appears well.   ASSESSMENT:  STI Screen   PLAN:  Pt offered STI blood screening-requested GC, chlamydia, and trichomonas probe sent to lab.  Treatment: To be determined once lab results are received.  Pt follow up as needed.   Danna Hefty [employer]

## 2023-04-02 LAB — RPR+HBSAG+HCVAB+...
HIV Screen 4th Generation wRfx: NONREACTIVE
Hep C Virus Ab: NONREACTIVE
Hepatitis B Surface Ag: NEGATIVE
RPR Ser Ql: NONREACTIVE

## 2023-04-02 LAB — CERVICOVAGINAL ANCILLARY ONLY
Bacterial Vaginitis (gardnerella): POSITIVE — AB
Candida Glabrata: NEGATIVE
Candida Vaginitis: NEGATIVE
Chlamydia: NEGATIVE
Comment: NEGATIVE
Comment: NEGATIVE
Comment: NEGATIVE
Comment: NEGATIVE
Comment: NEGATIVE
Comment: NORMAL
Neisseria Gonorrhea: NEGATIVE
Trichomonas: NEGATIVE

## 2023-04-03 MED ORDER — METRONIDAZOLE 0.75 % VA GEL
1.0000 | Freq: Every day | VAGINAL | 1 refills | Status: DC
Start: 2023-04-03 — End: 2023-06-27

## 2023-04-03 NOTE — Addendum Note (Signed)
 Addended by: Gerrit Heck L on: 04/03/2023 02:46 PM   Modules accepted: Orders

## 2023-04-17 ENCOUNTER — Telehealth: Payer: Self-pay

## 2023-04-17 NOTE — Telephone Encounter (Signed)
 Updated [facility] diagnosis L50.1 added J31.0 and R21

## 2023-04-19 IMAGING — US US MFM OB FOLLOW-UP
1 series · 13 of 28 positions shown · non-contrast
Comparison: none

[Series 1: us mfm ob follow-up · 55 acquisitions, 13 frames shown]
[im 3/55]
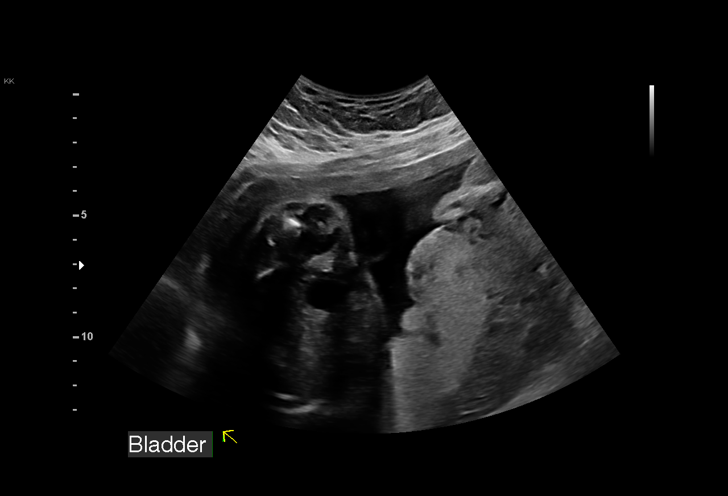
[im 7/55]
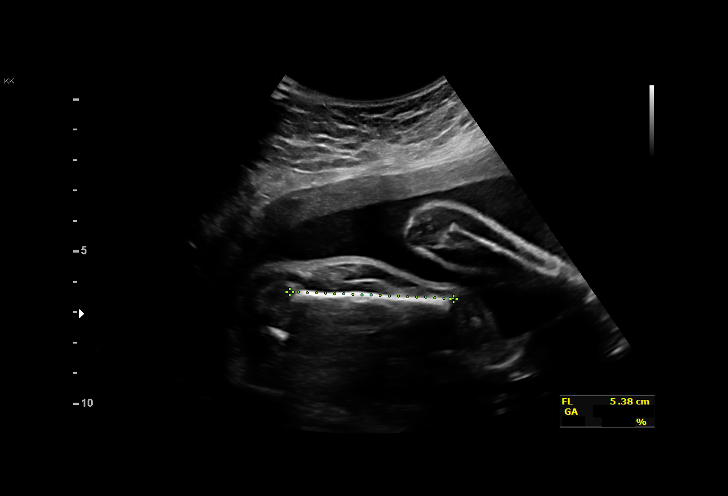
[im 11/55]
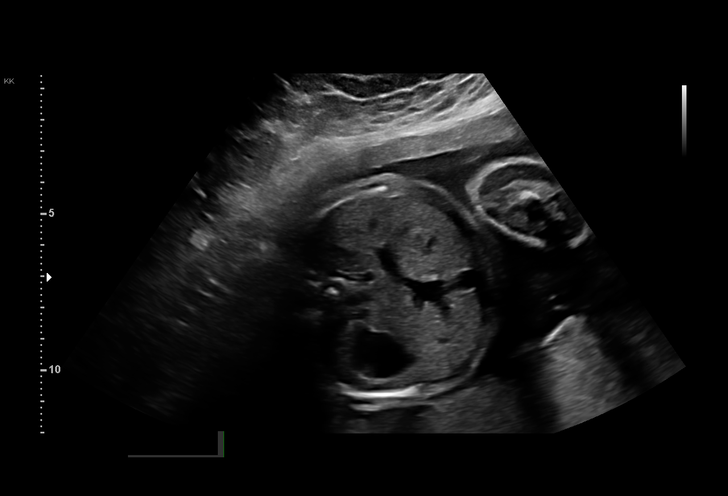
[im 15/55]
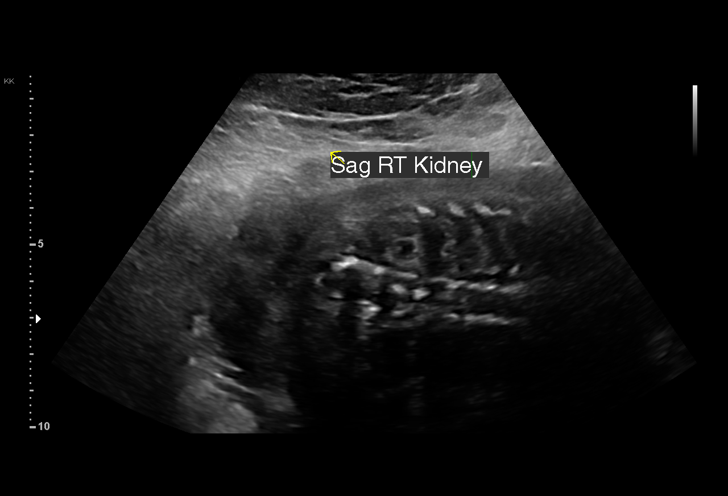
[im 19/55]
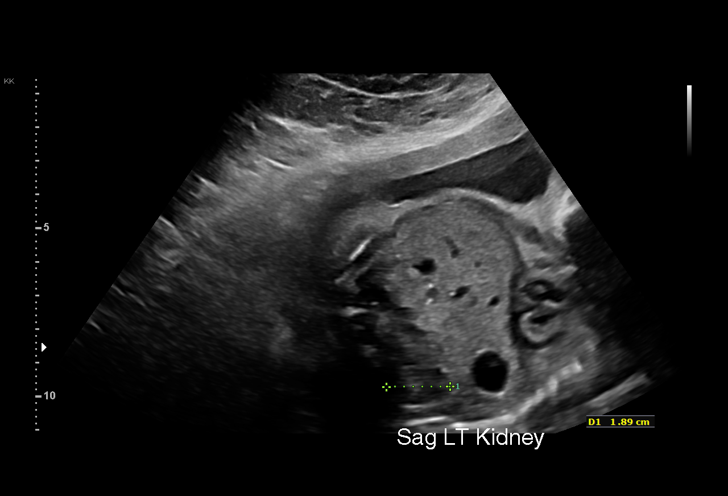
[im 23/55]
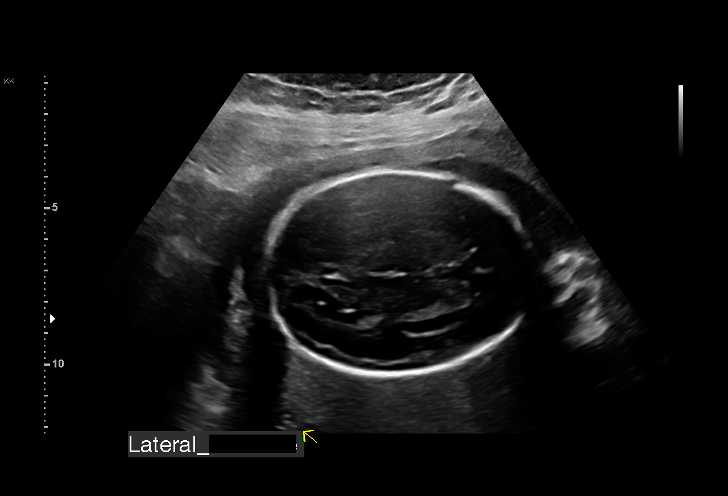
[im 29/55]
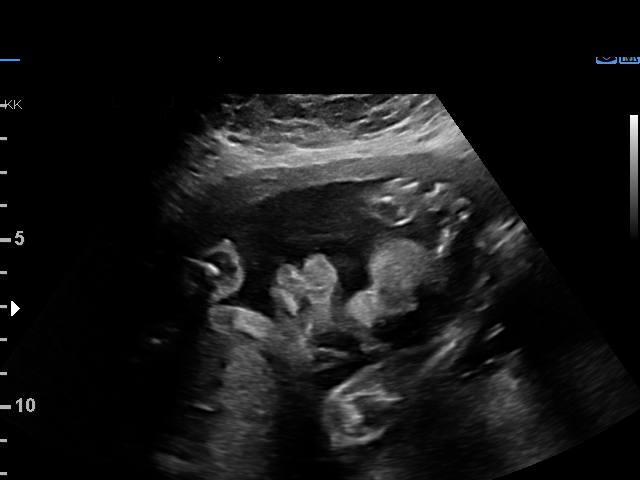
[im 33/55]
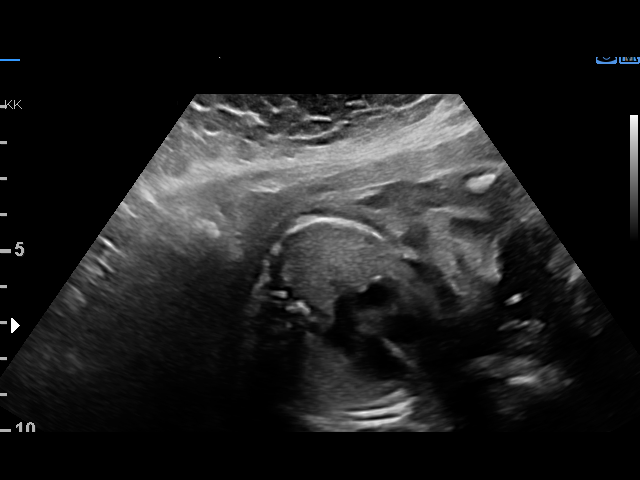
[im 37/55]
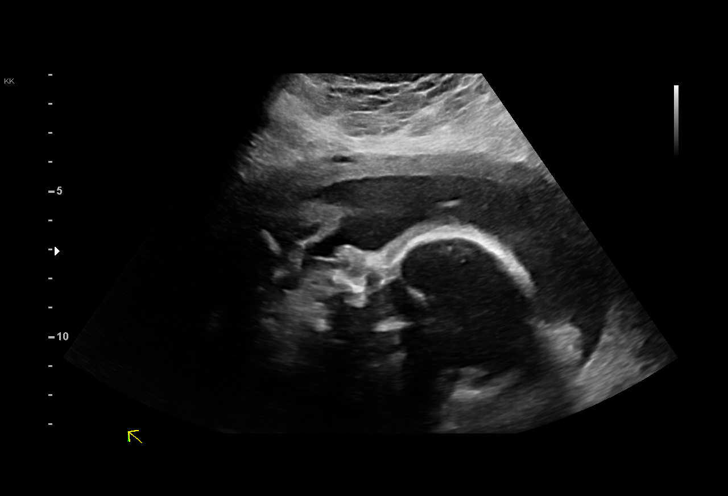
[im 41/55]
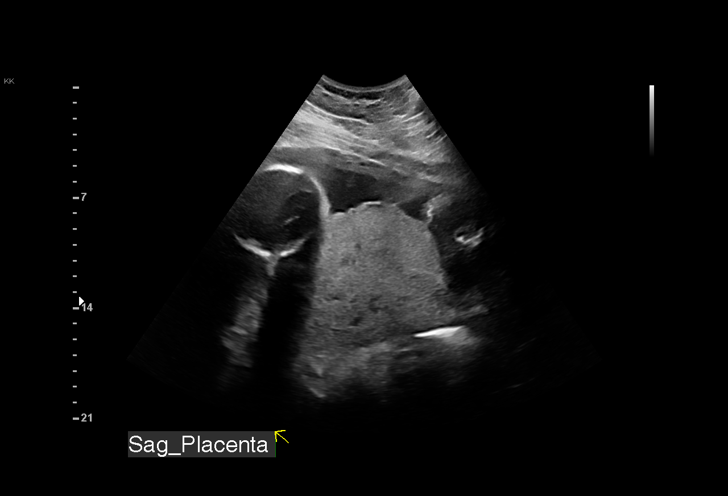
[im 45/55]
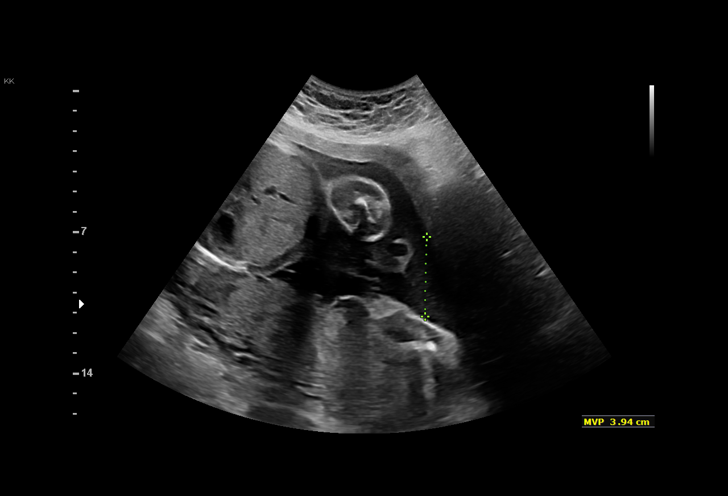
[im 49/55]
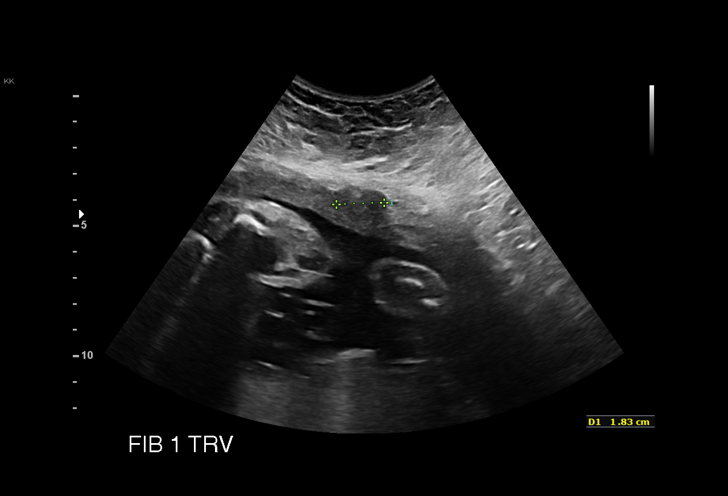
[im 53/55]
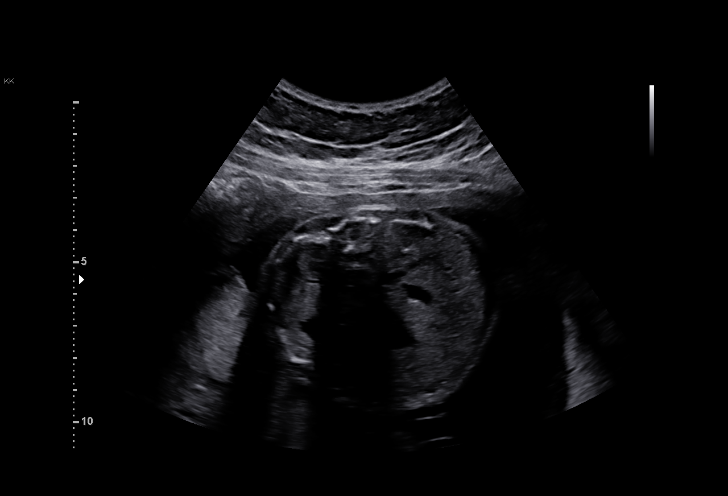

[13 of 28 positions shown; findings below may reference images not displayed]

Indications

 Gestational diabetes in pregnancy,
 controlled by oral hypoglycemic drugs
 Obesity complicating pregnancy, second
 trimester (BMI 38)
 Positive antinuclear antibodies
 LR NIPS/Horizon-Neg
 Uterine fibroids
 26 weeks gestation of pregnancy
 Encounter for other antenatal screening
 follow-up
Fetal Evaluation

 Num Of Fetuses:         1
 Fetal Heart Rate(bpm):  167
 Cardiac Activity:       Observed
 Presentation:           Transverse, head to maternal left
 Placenta:               Posterior
 P. Cord Insertion:      Previously Visualized

 Amniotic Fluid
 AFI FV:      Within normal limits

                             Largest Pocket(cm)

Biometry
 BPD:      64.8  mm     G. Age:  26w 1d         36  %    CI:        74.39   %    70 - 86
                                                         FL/HC:      22.0   %    18.6 -
 HC:      238.5  mm     G. Age:  25w 6d         15  %    HC/AC:      1.01        1.04 -
 AC:      235.9  mm     G. Age:  27w 6d         86  %    FL/BPD:     80.9   %    71 - 87
 FL:       52.4  mm     G. Age:  27w 6d         82  %    FL/AC:      22.2   %    20 - 24

 Est. FW:    6619  gm      2 lb 7 oz     88  %
OB History

 Gravidity:    3         Term:   1        Prem:   0        SAB:   1
 TOP:          0       Ectopic:  0        Living: 1
Gestational Age

 LMP:           27w 1d        Date:  11/14/20                  EDD:   08/21/21
 U/S Today:     27w 0d                                        EDD:   08/22/21
 Best:          26w 2d     Det. By:  U/S  (03/27/21)          EDD:   08/27/21
Anatomy

 Cranium:               Appears normal         Aortic Arch:            Previously seen
 Cavum:                 Appears normal         Ductal Arch:            Previously seen
 Ventricles:            Appears normal         Diaphragm:              Appears normal
 Choroid Plexus:        Previously seen        Stomach:                Appears normal, left
                                                                       sided
 Cerebellum:            Appears normal         Abdomen:                Previously seen
 Posterior Fossa:       Previously seen        Abdominal Wall:         Previously seen
 Nuchal Fold:           Not applicable (>20    Cord Vessels:           Previously seen
                        wks GA)
 Face:                  Orbits and profile     Kidneys:                Appear normal
                        previously seen
 Lips:                  Previously seen        Bladder:                Appears normal
 Thoracic:              Appears normal         Spine:                  Previously seen
 Heart:                 Appears normal         Upper Extremities:      Previously seen
                        (4CH, axis, and
                        situs)
 RVOT:                  Previously seen        Lower Extremities:      Previously seen
 LVOT:                  Previously seen

 Other:  Fetus appears to be female. VC, 3VV, 3VTV, nasal bone, lenses,
         maxilla, mandible, falx, feet and open hands/5th digits previously
         visualized.
Cervix Uterus Adnexa

 Cervix
 Not visualized (advanced GA >14wks)
Myomas

 Site                     L(cm)      W(cm)      D(cm)       Location
 Anterior

 Blood Flow                  RI       PI       Comments
Impression

 Gestational diabetes. Patient takes metformin for control .

 Fetal growth is appropriate for gestational age .Amniotic fluid
 is normal and good fetal activity is seen. A small intramural
 myoma is seen.
Recommendations

 -An appointment was made for her to return in 4 weeks for
 fetal growth assessment.
                Martines, Mayank

## 2023-05-12 DIAGNOSIS — F329 Major depressive disorder, single episode, unspecified: Secondary | ICD-10-CM | POA: Diagnosis not present

## 2023-05-13 ENCOUNTER — Ambulatory Visit

## 2023-05-13 ENCOUNTER — Ambulatory Visit: Payer: Medicaid Other | Admitting: Dermatology

## 2023-05-19 DIAGNOSIS — F329 Major depressive disorder, single episode, unspecified: Secondary | ICD-10-CM | POA: Diagnosis not present

## 2023-05-20 DIAGNOSIS — E119 Type 2 diabetes mellitus without complications: Secondary | ICD-10-CM | POA: Diagnosis not present

## 2023-05-28 ENCOUNTER — Encounter: Payer: Self-pay | Admitting: Family Medicine

## 2023-05-29 NOTE — Telephone Encounter (Signed)
 Patient called in to make sure that Dr. Cathyann Cobia was sent her mychart message. She stated that she is on her second period for the month and doesn't know if she needs to come in for an appointment or not.   Routing to Dr. Cathyann Cobia. Informed patient that he would respond to her via mychart.

## 2023-06-24 ENCOUNTER — Ambulatory Visit

## 2023-06-24 ENCOUNTER — Other Ambulatory Visit (HOSPITAL_COMMUNITY)
Admission: RE | Admit: 2023-06-24 | Discharge: 2023-06-24 | Disposition: A | Discharge status: Home / Self Care | Source: Ambulatory Visit | Attending: Family Medicine | Admitting: Family Medicine

## 2023-06-24 VITALS — BP 109/56 | HR 64 | Ht 65.0 in | Wt 210.0 lb

## 2023-06-24 DIAGNOSIS — Z113 Encounter for screening for infections with a predominantly sexual mode of transmission: Secondary | ICD-10-CM | POA: Diagnosis not present

## 2023-06-24 DIAGNOSIS — N898 Other specified noninflammatory disorders of vagina: Secondary | ICD-10-CM | POA: Insufficient documentation

## 2023-06-24 NOTE — Progress Notes (Signed)
 SUBJECTIVE:  28 y.o. female who desires a STI screen. Denies abnormal vaginal discharge, bleeding or significant pelvic pain. No UTI symptoms. Denies history of known exposure to STD.  Patient's last menstrual period was 06/24/2023 (exact date).  OBJECTIVE:  She appears well.   ASSESSMENT:  STI Screen   PLAN:  Pt offered STI blood screening-requested GC, chlamydia, and trichomonas probe sent to lab.  Treatment: To be determined once lab results are received.  Pt follow up as needed.

## 2023-06-25 ENCOUNTER — Ambulatory Visit: Payer: Self-pay | Admitting: Obstetrics and Gynecology

## 2023-06-25 DIAGNOSIS — B9689 Other specified bacterial agents as the cause of diseases classified elsewhere: Secondary | ICD-10-CM

## 2023-06-25 LAB — RPR+HBSAG+HCVAB+...
HIV Screen 4th Generation wRfx: NONREACTIVE
Hep C Virus Ab: NONREACTIVE
Hepatitis B Surface Ag: NEGATIVE
RPR Ser Ql: NONREACTIVE

## 2023-06-26 LAB — CERVICOVAGINAL ANCILLARY ONLY
Bacterial Vaginitis (gardnerella): POSITIVE — AB
Candida Glabrata: NEGATIVE
Candida Vaginitis: NEGATIVE
Chlamydia: NEGATIVE
Comment: NEGATIVE
Comment: NEGATIVE
Comment: NEGATIVE
Comment: NEGATIVE
Comment: NEGATIVE
Comment: NORMAL
Neisseria Gonorrhea: NEGATIVE
Trichomonas: NEGATIVE

## 2023-06-27 MED ORDER — METRONIDAZOLE 0.75 % VA GEL: 1.0000 | Freq: Every day | VAGINAL | 1 refills | Status: DC

## 2023-07-23 ENCOUNTER — Ambulatory Visit

## 2023-07-23 ENCOUNTER — Other Ambulatory Visit (HOSPITAL_COMMUNITY)
Admission: RE | Admit: 2023-07-23 | Discharge: 2023-07-23 | Disposition: A | Discharge status: Home / Self Care | Source: Ambulatory Visit | Attending: Obstetrics & Gynecology | Admitting: Obstetrics & Gynecology

## 2023-07-23 VITALS — BP 110/66 | HR 79 | Wt 212.0 lb

## 2023-07-23 DIAGNOSIS — N898 Other specified noninflammatory disorders of vagina: Secondary | ICD-10-CM

## 2023-07-23 NOTE — Progress Notes (Signed)
 SUBJECTIVE:  28 y.o. female complains of white vaginal discharge for 1 day(s). Denies abnormal vaginal bleeding or significant pelvic pain or fever. No UTI symptoms. Denies history of known exposure to STD.  Patient's last menstrual period was 06/24/2023 (exact date).  OBJECTIVE:  She appears well, afebrile. Urine dipstick: not done.  ASSESSMENT:  Vaginal Discharge  Vaginal Odor   PLAN:  GC, chlamydia, trichomonas, BVAG, CVAG probe sent to lab. Treatment: To be determined once lab results are received ROV prn if symptoms persist or worsen.  Melanie Cruz l Melanie Cruz, CMA

## 2023-07-28 LAB — CERVICOVAGINAL ANCILLARY ONLY
Bacterial Vaginitis (gardnerella): POSITIVE — AB
Candida Glabrata: NEGATIVE
Candida Vaginitis: NEGATIVE
Chlamydia: NEGATIVE
Comment: NEGATIVE
Comment: NEGATIVE
Comment: NEGATIVE
Comment: NEGATIVE
Comment: NEGATIVE
Comment: NORMAL
Neisseria Gonorrhea: NEGATIVE
Trichomonas: NEGATIVE

## 2023-07-30 ENCOUNTER — Ambulatory Visit: Payer: Self-pay | Admitting: Obstetrics & Gynecology

## 2023-07-30 DIAGNOSIS — B9689 Other specified bacterial agents as the cause of diseases classified elsewhere: Secondary | ICD-10-CM

## 2023-07-30 MED ORDER — METRONIDAZOLE 500 MG PO TABS: 500.0000 mg | ORAL_TABLET | Freq: Two times a day (BID) | ORAL | 0 refills | Status: AC

## 2023-08-11 ENCOUNTER — Encounter: Payer: Self-pay | Admitting: Medical

## 2023-08-11 ENCOUNTER — Other Ambulatory Visit: Payer: Self-pay

## 2023-08-11 MED ORDER — METFORMIN HCL 500 MG PO TABS: 500.0000 mg | ORAL_TABLET | Freq: Two times a day (BID) | ORAL | 3 refills | Status: AC

## 2023-08-13 ENCOUNTER — Ambulatory Visit: Payer: Self-pay | Admitting: *Deleted

## 2023-08-13 NOTE — Telephone Encounter (Signed)
  FYI Only or Action Required?: FYI only for provider.  Patient was last seen in primary care on 02/20/2023 by Dorina Loving, PA-C.  Called Nurse Triage reporting Pain.  Symptoms began several days ago.  Interventions attempted: Rest, hydration, or home remedies.  Symptoms are: gradually worsening.  Triage Disposition: See PCP When Office is Open (Within 3 Days)  Patient/caregiver understands and will follow disposition?: Yes                Copied from CRM #[id number]. Topic: Clinical - Red Word Triage >> Aug 13, 2023  2:03 PM [place] A wrote: [facility] that prompted transfer to Nurse Triage: Left knee and heel has been hurting since Monday. Reason for Disposition  [1] MODERATE pain (e.g., interferes with normal activities, limping) AND [2] present > 3 days  Answer Assessment - Initial Assessment Questions Appt scheduled for 08/15/23. Recommended if worsening sx call back.     1. LOCATION and RADIATION: Where is the pain located?      Left knee 2. QUALITY: What does the pain feel like?  (e.g., sharp, dull, aching, burning)     Dull pain 3. SEVERITY: How bad is the pain? What does it keep you from doing?   (Scale 1-10; or mild, moderate, severe)     10/10 4. ONSET: When did the pain start? Does it come and go, or is it there all the time?     Monday , comes and goes with going up and down stairs 5. RECURRENT: Have you had this pain before? If Yes, ask: When, and what happened then?     Na  6. SETTING: Has there been any recent work, exercise or other activity that involved that part of the body?      Na  7. AGGRAVATING FACTORS: What makes the knee pain worse? (e.g., walking, climbing stairs, running)     Climbing stairs  8. ASSOCIATED SYMPTOMS: Is there any swelling or redness of the knee?     na 9. OTHER SYMPTOMS: Do you have any other symptoms? (e.g., calf pain, chest pain, difficulty breathing, fever)     Left knee pain  , left heel pain    10. PREGNANCY: Is there any chance you are pregnant? When was your last menstrual period?       na  Protocols used: Knee Pain-A-AH

## 2023-08-15 ENCOUNTER — Ambulatory Visit: Admitting: Medical

## 2023-08-22 ENCOUNTER — Ambulatory Visit: Admitting: Medical

## 2023-09-02 ENCOUNTER — Ambulatory Visit

## 2023-09-11 ENCOUNTER — Other Ambulatory Visit (HOSPITAL_COMMUNITY)
Admission: RE | Admit: 2023-09-11 | Discharge: 2023-09-11 | Disposition: A | Discharge status: Home / Self Care | Source: Ambulatory Visit | Attending: Obstetrics and Gynecology | Admitting: Obstetrics and Gynecology

## 2023-09-11 ENCOUNTER — Ambulatory Visit (INDEPENDENT_AMBULATORY_CARE_PROVIDER_SITE_OTHER)

## 2023-09-11 VITALS — BP 108/70 | HR 64 | Ht 64.0 in | Wt 211.0 lb

## 2023-09-11 DIAGNOSIS — Z113 Encounter for screening for infections with a predominantly sexual mode of transmission: Secondary | ICD-10-CM | POA: Diagnosis not present

## 2023-09-11 NOTE — Progress Notes (Signed)
 SUBJECTIVE:  28 y.o. female who desires a STI screen. New partner Denies abnormal vaginal discharge, bleeding or significant pelvic pain. No UTI symptoms. Denies history of known exposure to STD.  Patient's last menstrual period was 08/29/2023 (approximate).  OBJECTIVE:  She appears well.   ASSESSMENT:  STI Screen   PLAN:  Pt offered STI blood screening-requested GC, chlamydia, and trichomonas probe sent to lab.  Treatment: To be determined once lab results are received.  Pt follow up as needed.

## 2023-09-12 ENCOUNTER — Ambulatory Visit: Payer: Self-pay | Admitting: Obstetrics and Gynecology

## 2023-09-12 LAB — RPR+HBSAG+HCVAB+...
HIV Screen 4th Generation wRfx: NONREACTIVE
Hep C Virus Ab: NONREACTIVE
Hepatitis B Surface Ag: NEGATIVE
RPR Ser Ql: NONREACTIVE

## 2023-09-15 ENCOUNTER — Ambulatory Visit: Payer: Self-pay

## 2023-09-15 LAB — CERVICOVAGINAL ANCILLARY ONLY
Bacterial Vaginitis (gardnerella): NEGATIVE
Candida Glabrata: NEGATIVE
Candida Vaginitis: NEGATIVE
Chlamydia: NEGATIVE
Comment: NEGATIVE
Comment: NEGATIVE
Comment: NEGATIVE
Comment: NEGATIVE
Comment: NEGATIVE
Comment: NORMAL
Neisseria Gonorrhea: NEGATIVE
Trichomonas: NEGATIVE

## 2023-09-15 NOTE — Telephone Encounter (Signed)
 FYI Only or Action Required?: Action required by provider: request for appointment.  Patient was last seen in primary care on 02/20/2023 by Dorina Loving, PA-C.  Called Nurse Triage reporting Lump in throat.  Symptoms began yesterday.  Interventions attempted: Nothing.  Symptoms are: unchanged.After eating dinner last night, felt lump in throat. No difficulty swallowing or breathing. Eating fine today. Cannot come in today. Appointment made for tomorrow. Will go to ED for worsening of symptoms.  Triage Disposition: See PCP When Office is Open (Within 3 Days)  Patient/caregiver understands and will follow disposition?:    Copied from CRM #[id number]. Topic: Clinical - Red Word Triage >> Sep 15, 2023 11:52 AM Vena HERO wrote: Red Word that prompted transfer to Nurse Triage: feels like something is stuck in throat Reason for Disposition  [1] Sore throat is the only symptom AND [2] present > 48 hours  Answer Assessment - Initial Assessment Questions 1. ONSET: When did the throat start hurting? (Hours or days ago)      Last night 2. SEVERITY: How bad is the sore throat? (Scale 1-10; mild, moderate or severe)     3 3. STREP EXPOSURE: Has there been any exposure to strep within the past week? If Yes, ask: What type of contact occurred?      no 4.  VIRAL SYMPTOMS: Are there any symptoms of a cold, such as a runny nose, cough, hoarse voice or red eyes?      no 5. FEVER: Do you have a fever? If Yes, ask: What is your temperature, how was it measured, and when did it start?     no 6. PUS ON THE TONSILS: Is there pus on the tonsils in the back of your throat?     no 7. OTHER SYMPTOMS: Do you have any other symptoms? (e.g., difficulty breathing, headache, rash)     no 8. PREGNANCY: Is there any chance you are pregnant? When was your last menstrual period?     no  Protocols used: Sore Throat-A-AH

## 2023-09-16 ENCOUNTER — Ambulatory Visit: Admitting: Medical

## 2023-09-24 ENCOUNTER — Ambulatory Visit: Admitting: Medical

## 2023-10-22 ENCOUNTER — Ambulatory Visit

## 2023-10-29 ENCOUNTER — Ambulatory Visit (INDEPENDENT_AMBULATORY_CARE_PROVIDER_SITE_OTHER)

## 2023-10-29 ENCOUNTER — Other Ambulatory Visit (HOSPITAL_COMMUNITY)
Admission: RE | Admit: 2023-10-29 | Discharge: 2023-10-29 | Disposition: A | Discharge status: Home / Self Care | Source: Ambulatory Visit | Attending: Obstetrics and Gynecology | Admitting: Obstetrics and Gynecology

## 2023-10-29 VITALS — BP 110/70 | HR 81 | Ht 64.0 in | Wt 214.0 lb

## 2023-10-29 DIAGNOSIS — Z113 Encounter for screening for infections with a predominantly sexual mode of transmission: Secondary | ICD-10-CM | POA: Diagnosis not present

## 2023-10-29 DIAGNOSIS — N898 Other specified noninflammatory disorders of vagina: Secondary | ICD-10-CM

## 2023-10-29 NOTE — Progress Notes (Signed)
 SUBJECTIVE:  28 y.o. female who desires a STI screen. Denies abnormal vaginal discharge, bleeding or significant pelvic pain. No UTI symptoms. Denies history of known exposure to STD.  Patient's last menstrual period was 10/12/2023.  OBJECTIVE:  She appears well.   ASSESSMENT:  STI Screen   PLAN:  Pt offered STI blood screening-requested GC, chlamydia, and trichomonas probe sent to lab.  Treatment: To be determined once lab results are received.  Pt follow up as needed.   Erminio DELENA Rumps, RN

## 2023-10-30 ENCOUNTER — Ambulatory Visit: Payer: Self-pay | Admitting: Obstetrics and Gynecology

## 2023-10-30 DIAGNOSIS — B9689 Other specified bacterial agents as the cause of diseases classified elsewhere: Secondary | ICD-10-CM

## 2023-10-30 LAB — RPR+HBSAG+HCVAB+...
HIV Screen 4th Generation wRfx: NONREACTIVE
Hep C Virus Ab: NONREACTIVE
Hepatitis B Surface Ag: NEGATIVE
RPR Ser Ql: NONREACTIVE

## 2023-10-31 LAB — CERVICOVAGINAL ANCILLARY ONLY
Bacterial Vaginitis (gardnerella): POSITIVE — AB
Candida Glabrata: NEGATIVE
Candida Vaginitis: NEGATIVE
Chlamydia: NEGATIVE
Comment: NEGATIVE
Comment: NEGATIVE
Comment: NEGATIVE
Comment: NEGATIVE
Comment: NEGATIVE
Comment: NORMAL
Neisseria Gonorrhea: NEGATIVE
Trichomonas: NEGATIVE

## 2023-11-03 MED ORDER — METRONIDAZOLE 0.75 % VA GEL: 1.0000 | Freq: Every day | VAGINAL | 1 refills | Status: DC

## 2023-11-07 ENCOUNTER — Ambulatory Visit: Payer: Self-pay

## 2023-11-07 NOTE — Telephone Encounter (Signed)
 FYI Only or Action Required?: FYI only for provider: Home care.  Patient was last seen in primary care on 02/20/2023 by Dorina Loving, PA-C.  Called Nurse Triage reporting ingrown hair.  Symptoms began several days ago.  Interventions attempted: Nothing.  Symptoms are: stable.  Triage Disposition: Home Care  Patient/caregiver understands and will follow disposition?: Yes Reason for Disposition  Red tender lump < 1/2 inch across (< 12 mm; smaller than a marble)  Answer Assessment - Initial Assessment Questions Patient stated it busted yesterday, red pus. Patient putting toilet paper on top of it and noticed some pus. Advised patient to keep the area clean and add some antibacterial ointment to it and monitor at home.   1. APPEARANCE of BOIL: What does the boil look like?      Thinks it was a whitehead / ingrown hair  2. LOCATION: Where is the boil located?      In between vagina and anus  3. NUMBER: How many boils are there?      1  4. SIZE: How big is the boil? (e.g., inches, cm; compare to size of a coin or other object)     A pea  5. ONSET: When did the boil start?     A week ago  6. PAIN: Is there any pain? If Yes, ask: How bad is the pain?   (Scale 1-10; or mild, moderate, severe)     States it burned a bit to the area to pee  7. OTHER SYMPTOMS: Do you have any other symptoms? (e.g., shaking chills, weakness, rash elsewhere on body)     Denies  Protocols used: Boil (Skin Abscess)-A-AH  Copied from CRM #[id number]. Topic: Clinical - Red Word Triage >> Nov 07, 2023  4:59 PM Alfonso HERO wrote: Red Word that prompted transfer to Nurse Triage: patient has HS and said she had what she thinks was a hair bump that has ruptured and scared of it becoming infected.

## 2023-11-12 ENCOUNTER — Encounter: Admitting: Medical

## 2023-11-12 ENCOUNTER — Encounter: Payer: Self-pay | Admitting: Medical

## 2023-11-13 NOTE — Progress Notes (Signed)
 This encounter was created in error - please disregard.

## 2023-11-18 ENCOUNTER — Ambulatory Visit (INDEPENDENT_AMBULATORY_CARE_PROVIDER_SITE_OTHER): Admitting: Medical

## 2023-11-18 VITALS — BP 112/82 | HR 79 | Temp 98.4°F | Resp 14 | Ht 64.0 in | Wt 218.0 lb

## 2023-11-18 DIAGNOSIS — F17200 Nicotine dependence, unspecified, uncomplicated: Secondary | ICD-10-CM | POA: Diagnosis not present

## 2023-11-18 DIAGNOSIS — E010 Iodine-deficiency related diffuse (endemic) goiter: Secondary | ICD-10-CM

## 2023-11-18 DIAGNOSIS — R7303 Prediabetes: Secondary | ICD-10-CM | POA: Diagnosis not present

## 2023-11-18 DIAGNOSIS — K219 Gastro-esophageal reflux disease without esophagitis: Secondary | ICD-10-CM

## 2023-11-18 MED ORDER — BUPROPION HCL ER (XL) 150 MG PO TB24: 150.0000 mg | ORAL_TABLET | Freq: Every day | ORAL | 1 refills | Status: AC

## 2023-11-18 NOTE — Progress Notes (Signed)
   Subjective:    Patient ID: Melanie Cruz, female    DOB: Jun 27, 1995, 28 y.o.   MRN: [id number]  HPI  Melanie Cruz is a 28 year old female who presents for follow-up and A1c testing.  Her last A1c was in August 2024, which was 5.8. Her blood sugar averages between 118 to 120. She has experienced weight fluctuations, having lost weight previously without using metformin , but has since gained it back. She recently restarted metformin  after refilling it about two to three weeks ago.  She takes famotidine  for heartburn daily. Well controlled.   No current need for allergy  medication, although she used Flonase  about two years ago.  Regarding her anxiety, she mentions it is 'kind of been going good' and has been getting out more, hanging out with friends, which helps. She was previously prescribed Buspar  in February of last year but is not currently taking it. She feels her anxiety is controlled but still overthinks at times.  She has been smoking for about fifteen years, currently less than half a pack a day. She was prescribed Wellbutrin  (bupropion  XL 150 mg) in January to help quit smoking but is unsure if she filled the prescription.  Her last menstrual cycle was from the first to the sixth of the current month. She has no history of seizures or eating disorders.  She has a benign cyst on the left lobe of her thyroid , which was noted to have minimal enlargement from 0.6 cm to 0.8 cm since the last study. She reports no pain or difficulty swallowing, although she sometimes feels a mental sensation of something growing in her throat.        Lmp- on week ago. 11-08-23 thru 11-13-2023.   Review of Systems See hpi    Objective:   Physical Exam  General- No acute distress. Pleasant patient. Neck- Full range of motion, no jvd. Moderate enlarged thyroid  enlargement. Left side slight more prominent. Lungs- Clear, even and unlabored. Heart- regular rate and rhythm. Neurologic- CNII-  XII grossly intact.        Assessment & Plan:   Patient Instructions  Prediabetes A1c 5.8% indicates prediabetes.  - Ordered A1c and metabolic panel. - Restart metformin  for prediabetes management and potential weight loss.  Gastroesophageal reflux disease GERD symptoms controlled with famotidine . Reports gas without significant heartburn. - Continue famotidine  as needed.  Nicotine  dependence Smoking less than half a pack per day for 15 years. Discussed smoking cessation plan with bupropion  and cigarette tapering. - Prescribed bupropion  150 mg XL, 30 tablets with one refill. - Advised to set a quit date, start bupropion , taper off cigarettes two weeks after starting medication.  Thyromegaly with benign left thyroid  cyst Benign left thyroid  cyst with minimal enlargement from 0.6 cm to 0.8 cm. No symptoms present. Previous ultrasound indicated benign appearance. - Ordered TSH and T4. - Ordered repeat thyroid  ultrasound.  Anxiety disorder Anxiety controlled with increased social interaction and outdoor activities. Buspar  not currently needed. - Continue to monitor anxiety symptoms and use MyChart for communication if needed.  Follow up date to be determined after lab review   Melanie Maxwell, PA-C

## 2023-11-18 NOTE — Patient Instructions (Addendum)
 Prediabetes A1c 5.8% indicates prediabetes.  - Ordered A1c and metabolic panel. - Restart metformin  for prediabetes management and potential weight loss.  Gastroesophageal reflux disease GERD symptoms controlled with famotidine . Reports gas without significant heartburn. - Continue famotidine  as needed.  Nicotine  dependence Smoking less than half a pack per day for 15 years. Discussed smoking cessation plan with bupropion  and cigarette tapering. - Prescribed bupropion  150 mg XL, 30 tablets with one refill. - Advised to set a quit date, start bupropion , taper off cigarettes two weeks after starting medication.  Thyromegaly with benign left side  thyroid  cyst Benign left thyroid  cyst with minimal enlargement from 0.6 cm to 0.8 cm. No symptoms present. Previous ultrasound indicated benign appearance. - Ordered TSH and T4. - Ordered repeat thyroid  ultrasound.  Anxiety disorder Anxiety controlled with increased social interaction and outdoor activities. Buspar  not currently needed. - Continue to monitor anxiety symptoms and use MyChart for communication if needed.  Follow up date to be determined after lab review

## 2023-11-19 ENCOUNTER — Ambulatory Visit: Payer: Self-pay | Admitting: Medical

## 2023-11-19 LAB — COMPLETE METABOLIC PANEL WITHOUT GFR
AG Ratio: 1.8 (calc) (ref 1.0–2.5)
ALT: 15 U/L (ref 6–29)
AST: 16 U/L (ref 10–30)
Albumin: 4.4 g/dL (ref 3.6–5.1)
Alkaline phosphatase (APISO): 35 U/L (ref 31–125)
BUN: 17 mg/dL (ref 7–25)
CO2: 22 mmol/L (ref 20–32)
Calcium: 9 mg/dL (ref 8.6–10.2)
Chloride: 110 mmol/L (ref 98–110)
Creat: 0.75 mg/dL (ref 0.50–0.97)
Globulin: 2.5 g/dL (ref 1.9–3.7)
Glucose, Bld: 91 mg/dL (ref 65–99)
Potassium: 4.1 mmol/L (ref 3.5–5.3)
Sodium: 140 mmol/L (ref 135–146)
Total Bilirubin: 0.2 mg/dL (ref 0.2–1.2)
Total Protein: 6.9 g/dL (ref 6.1–8.1)

## 2023-11-19 LAB — TSH: TSH: 1.2 u[IU]/mL (ref 0.35–5.50)

## 2023-11-19 LAB — T4, FREE: Free T4: 0.78 ng/dL (ref 0.60–1.60)

## 2023-11-19 LAB — HEMOGLOBIN A1C: Hgb A1c MFr Bld: 5.9 % (ref 4.6–6.5)

## 2023-11-22 ENCOUNTER — Ambulatory Visit (HOSPITAL_BASED_OUTPATIENT_CLINIC_OR_DEPARTMENT_OTHER)

## 2023-11-25 ENCOUNTER — Telehealth: Payer: Self-pay

## 2023-11-25 NOTE — Telephone Encounter (Signed)
 Patient called in wanting to relay a concern to Dr. Barbra. She stated that she has had two menstrual cycles so far this month. First cycle was on 11/1-11/6.The second cycle started on 11/15 with spotting with blood clots that is still continuing.   Patient wanted to know what her next steps should be.

## 2023-11-29 ENCOUNTER — Ambulatory Visit (HOSPITAL_BASED_OUTPATIENT_CLINIC_OR_DEPARTMENT_OTHER)

## 2023-12-02 ENCOUNTER — Ambulatory Visit

## 2023-12-02 ENCOUNTER — Ambulatory Visit (HOSPITAL_BASED_OUTPATIENT_CLINIC_OR_DEPARTMENT_OTHER)
Admission: RE | Admit: 2023-12-02 | Discharge: 2023-12-02 | Disposition: A | Discharge status: Home / Self Care | Source: Ambulatory Visit | Attending: Medical | Admitting: Medical

## 2023-12-02 DIAGNOSIS — E010 Iodine-deficiency related diffuse (endemic) goiter: Secondary | ICD-10-CM | POA: Diagnosis not present

## 2023-12-09 ENCOUNTER — Ambulatory Visit

## 2023-12-09 ENCOUNTER — Other Ambulatory Visit (HOSPITAL_COMMUNITY): Admission: RE | Admit: 2023-12-09 | Discharge: 2023-12-09 | Disposition: A | Source: Ambulatory Visit

## 2023-12-09 VITALS — BP 112/81 | HR 96 | Ht 65.0 in | Wt 216.0 lb

## 2023-12-09 DIAGNOSIS — Z202 Contact with and (suspected) exposure to infections with a predominantly sexual mode of transmission: Secondary | ICD-10-CM | POA: Diagnosis not present

## 2023-12-09 DIAGNOSIS — N898 Other specified noninflammatory disorders of vagina: Secondary | ICD-10-CM | POA: Diagnosis not present

## 2023-12-09 DIAGNOSIS — N6342 Unspecified lump in left breast, subareolar: Secondary | ICD-10-CM | POA: Diagnosis not present

## 2023-12-09 DIAGNOSIS — Z113 Encounter for screening for infections with a predominantly sexual mode of transmission: Secondary | ICD-10-CM

## 2023-12-09 NOTE — Progress Notes (Unsigned)
   GYNECOLOGY PROBLEM OFFICE VISIT NOTE  History:  Melanie Cruz is a 28 y.o. [id number] here today for breast lump. She states she noted lump about one month ago.  She states it is painful sometimes.  She initially contributed the lump to cyst. She reports having intermittent cysts since childhood. She endorses h/o HS.   She reports paternal aunt with breast cancer that metastasized to the brain.   She also reports vaginal odor that is like BV.  She states she did not complete previous treatment.  She also reports new sex partner and would like full testing today.   Past Medical History:  Diagnosis Date   COVID-19    Eczema    Gestational diabetes mellitus (GDM), antepartum 02/21/2021   Migraine     Past Surgical History:  Procedure Laterality Date   NO PAST SURGERIES      The following portions of the patient's history were reviewed and updated as appropriate: allergies, current medications, past family history, past medical history, past social history, past surgical history and problem list.   Health Maintenance:  Patient's last menstrual period was 11/28/2023 (exact date). Normal pap November 2024.  Normal mammogram on history.   Review of Systems:  Breasts: {pe breast exam:315056}. Genito-Urinary ROS: {rosgu:310671} Gastrointestinal ROS: {ros gi:310669} Objective:  Vitals: BP 112/81 (BP Location: Left Arm, Patient Position: Sitting, Cuff Size: Large)   Pulse 96   Ht 5' 5 (1.651 m)   Wt 216 lb (98 kg)   LMP 11/28/2023 (Exact Date)   BMI 35.94 kg/m   Physical Exam: OBGyn Exam   Labs and Imaging: US  THYROID  Result Date: 12/06/2023 CLINICAL DATA:  Thyroid  enlargement on the left, thyromegaly EXAM: THYROID  ULTRASOUND TECHNIQUE: Ultrasound examination of the thyroid  gland and adjacent soft tissues was performed. COMPARISON:  09/30/2022 FINDINGS: Parenchymal Echotexture: Mildly heterogenous Isthmus: 1.3 cm Right lobe: 6.3 x 2.1 x 2.6 cm Left lobe: 6.1 x 2.0 x 2.4 cm  _________________________________________________________ Estimated total number of nodules >/= 1 cm: 0 Number of spongiform nodules >/=  2 cm not described below (TR1): 0 Number of mixed cystic and solid nodules >/= 1.5 cm not described below (TR2): 0 _________________________________________________________ Overall similar mild thyroid  heterogeneity and diffuse enlargement. Normal vascularity. Benign incidental subcentimeter left inferior thyroid  cyst measures only 6 mm. No follow-up or biopsy recommended. No new thyroid  abnormality. No regional adenopathy. IMPRESSION: Similar mild thyroid  heterogeneity and enlargement. No significant finding by ultrasound. Electronically Signed   By: CHRISTELLA.  Shick M.D.   On: 12/06/2023 09:08    Assessment & Plan:  *** year old ***   -*** -*** -***   Total face-to-face time with patient: {Blank single:19197::15,25,30} minutes   Synthia Raisin, [place] 12/09/2023 4:35 PM

## 2023-12-10 LAB — RPR+HBSAG+HCVAB+...
HIV Screen 4th Generation wRfx: NONREACTIVE
Hep C Virus Ab: NONREACTIVE
Hepatitis B Surface Ag: NEGATIVE
RPR Ser Ql: NONREACTIVE

## 2023-12-11 ENCOUNTER — Ambulatory Visit: Payer: Self-pay

## 2023-12-11 LAB — CERVICOVAGINAL ANCILLARY ONLY
Bacterial Vaginitis (gardnerella): POSITIVE — AB
Candida Glabrata: NEGATIVE
Candida Vaginitis: NEGATIVE
Chlamydia: NEGATIVE
Comment: NEGATIVE
Comment: NEGATIVE
Comment: NEGATIVE
Comment: NEGATIVE
Comment: NEGATIVE
Comment: NORMAL
Neisseria Gonorrhea: NEGATIVE
Trichomonas: POSITIVE — AB

## 2023-12-12 ENCOUNTER — Telehealth: Payer: Self-pay

## 2023-12-12 ENCOUNTER — Other Ambulatory Visit: Payer: Self-pay

## 2023-12-12 DIAGNOSIS — A599 Trichomoniasis, unspecified: Secondary | ICD-10-CM

## 2023-12-12 DIAGNOSIS — B9689 Other specified bacterial agents as the cause of diseases classified elsewhere: Secondary | ICD-10-CM

## 2023-12-12 MED ORDER — METRONIDAZOLE 500 MG PO TABS: 500.0000 mg | ORAL_TABLET | Freq: Two times a day (BID) | ORAL | 0 refills | Status: DC

## 2023-12-12 NOTE — Telephone Encounter (Signed)
 Patient called regarding +BV and [place] results.  Explained the rx would be sent in to pharmacy for treatment.  Expressed need to take all 7 days.  Patient verbalized understanding.  Erminio DELENA Rumps, RN

## 2023-12-15 ENCOUNTER — Telehealth: Payer: Self-pay

## 2023-12-15 NOTE — Telephone Encounter (Signed)
 The patient called reporting that she believes she may be experiencing an allergic reaction to her medication. She stated that her toes became itchy, she developed a bump on her left hand, and her throat was itching last night. She took an allergy  pill, which she believes provided some relief. She also reported significant coughing before going to sleep. The patient plans to take another allergy  pill tonight along with her regular allergy  medication. Please advise on next steps.

## 2023-12-29 ENCOUNTER — Inpatient Hospital Stay: Admission: RE | Admit: 2023-12-29 | Discharge: 2023-12-29

## 2023-12-29 DIAGNOSIS — N6342 Unspecified lump in left breast, subareolar: Secondary | ICD-10-CM

## 2023-12-29 DIAGNOSIS — N6489 Other specified disorders of breast: Secondary | ICD-10-CM | POA: Diagnosis not present

## 2023-12-29 DIAGNOSIS — R928 Other abnormal and inconclusive findings on diagnostic imaging of breast: Secondary | ICD-10-CM | POA: Diagnosis not present

## 2024-01-12 NOTE — Progress Notes (Signed)
 The patient left without being seen.

## 2024-01-15 ENCOUNTER — Telehealth: Payer: Self-pay

## 2024-01-15 ENCOUNTER — Encounter: Admitting: Medical

## 2024-01-15 ENCOUNTER — Other Ambulatory Visit: Payer: Self-pay

## 2024-01-15 DIAGNOSIS — Z113 Encounter for screening for infections with a predominantly sexual mode of transmission: Secondary | ICD-10-CM

## 2024-01-15 NOTE — Telephone Encounter (Signed)
 Added rpr ,hiv ,and cervico ancillary testing future orders for pts visit tomorrow per pcp. Called pt and notified her of lab visit that was added on as well

## 2024-01-15 NOTE — Telephone Encounter (Signed)
 Pt called at 8:11 am today to get checked in for visit but decided she did not want it any longer . Says she needs to come in to be checked for stds only. I added her for NV tomorrow but do need a order stating that's its ok to perform cervico ancillary on pt from pcp

## 2024-01-15 NOTE — Telephone Encounter (Signed)
 error

## 2024-01-16 ENCOUNTER — Other Ambulatory Visit (INDEPENDENT_AMBULATORY_CARE_PROVIDER_SITE_OTHER)

## 2024-01-16 ENCOUNTER — Other Ambulatory Visit (HOSPITAL_COMMUNITY)
Admission: RE | Admit: 2024-01-16 | Discharge: 2024-01-16 | Disposition: A | Source: Ambulatory Visit | Attending: Medical | Admitting: Medical

## 2024-01-16 ENCOUNTER — Ambulatory Visit (INDEPENDENT_AMBULATORY_CARE_PROVIDER_SITE_OTHER): Admitting: *Deleted

## 2024-01-16 DIAGNOSIS — Z113 Encounter for screening for infections with a predominantly sexual mode of transmission: Secondary | ICD-10-CM

## 2024-01-16 DIAGNOSIS — E785 Hyperlipidemia, unspecified: Secondary | ICD-10-CM | POA: Diagnosis not present

## 2024-01-16 NOTE — Progress Notes (Signed)
 This encounter was created in error - please disregard.

## 2024-01-16 NOTE — Progress Notes (Signed)
 Patient here today for self swab per pcp.  Explained swab collection and pt collected.

## 2024-01-16 NOTE — Addendum Note (Signed)
 Addended by: JENEL SMALLER D on: 01/16/2024 03:26 PM   Modules accepted: Orders

## 2024-01-17 ENCOUNTER — Ambulatory Visit: Payer: Self-pay | Admitting: Medical

## 2024-01-17 LAB — HIV ANTIBODY (ROUTINE TESTING W REFLEX)
HIV 1&2 Ab, 4th Generation: NONREACTIVE
HIV FINAL INTERPRETATION: NEGATIVE

## 2024-01-17 LAB — LIPID PANEL
Cholesterol: 168 mg/dL
HDL: 48 mg/dL — ABNORMAL LOW
LDL Cholesterol (Calc): 103 mg/dL — ABNORMAL HIGH
Non-HDL Cholesterol (Calc): 120 mg/dL
Total CHOL/HDL Ratio: 3.5 (calc)
Triglycerides: 76 mg/dL

## 2024-01-17 LAB — SYPHILIS: RPR W/REFLEX TO RPR TITER AND TREPONEMAL ANTIBODIES, TRADITIONAL SCREENING AND DIAGNOSIS ALGORITHM: RPR Ser Ql: NONREACTIVE

## 2024-01-19 LAB — CERVICOVAGINAL ANCILLARY ONLY
Bacterial Vaginitis (gardnerella): NEGATIVE
Chlamydia: NEGATIVE
Comment: NEGATIVE
Comment: NEGATIVE
Comment: NEGATIVE
Comment: NORMAL
Neisseria Gonorrhea: NEGATIVE
Trichomonas: POSITIVE — AB

## 2024-01-19 MED ORDER — METRONIDAZOLE 500 MG PO TABS: 500.0000 mg | ORAL_TABLET | Freq: Three times a day (TID) | ORAL | 0 refills | Status: AC

## 2024-01-19 NOTE — Addendum Note (Signed)
 Addended by: DORINA DALLAS DORINA PA-C M on: 01/19/2024 08:36 PM   Modules accepted: Orders

## 2024-02-06 ENCOUNTER — Telehealth: Admitting: Family Medicine

## 2024-02-06 ENCOUNTER — Encounter: Payer: Self-pay | Admitting: Family Medicine

## 2024-02-06 DIAGNOSIS — B9789 Other viral agents as the cause of diseases classified elsewhere: Secondary | ICD-10-CM

## 2024-02-06 DIAGNOSIS — J329 Chronic sinusitis, unspecified: Secondary | ICD-10-CM | POA: Diagnosis not present

## 2024-02-06 MED ORDER — AMOXICILLIN-POT CLAVULANATE 875-125 MG PO TABS: 1.0000 | ORAL_TABLET | Freq: Two times a day (BID) | ORAL | 0 refills | Status: AC

## 2024-02-06 NOTE — Progress Notes (Signed)
 Virtual Video Visit via MyChart Note  I connected with  Melanie Cruz on 02/06/2024 at  9:20 AM EST by the video enabled telemedicine application for MyChart, and verified that I am speaking with the correct person using two identifiers.   I introduced myself as a Publishing Rights Manager with the practice. We discussed the limitations of evaluation and management by telemedicine and the availability of in person appointments. The patient expressed understanding and agreed to proceed.  Participating parties in this visit include: The patient and the nurse practitioner listed.  The patient is: At home I am: In the office - [facility]  Subjective:    CC:  Chief Complaint  Patient presents with   Sinus Problem    HPI: Melanie Cruz is a 29 y.o. year old female presenting today via MyChart today for sore throat and sinus issues.  Discussed the use of AI scribe software for clinical note transcription with the patient, who gave verbal consent to proceed.  History of Present Illness Melanie Cruz is a 29 year old female who presents with symptoms suggestive of a sinus infection.  She has been experiencing symptoms for the past four days, beginning with a dry nose and occasional epistaxis, which occurred three times. Currently, she has thick, clear mucus and a sore throat, with discomfort around her ethmoid sinuses and jawline, described as a 'discomfort/pressure type pain'.  No cough, chest pain, trouble breathing, nausea, vomiting, diarrhea, or fever. She has not taken any over-the-counter medications yet.   Her daughter is sick with conjunctivitis, and her son has a fever. She works at [employer] and is around a lot of people.  She reports sleeping okay, although her children sleep with her, which may affect her rest. She has not tested for flu or COVID-19.   States not allergic to [facility]   Past medical history, Surgical history, Family history not  pertinant except as noted below, Social history, Allergies, and medications have been entered into the medical record, reviewed, and corrections made.   Review of Systems:  All review of systems negative except what is listed in the HPI   Objective:    General:  Speaking clearly in complete sentences. Absent shortness of breath noted.   Alert and oriented x3.   Normal judgment.  Absent acute distress.   Impression and Recommendations:    Problem List Items Addressed This Visit   None Visit Diagnoses       Viral sinusitis    -  Primary   Relevant Medications   amoxicillin -clavulanate (AUGMENTIN ) 875-125 MG tablet       Assessment & Plan Acute sinusitis Symptoms suggest acute sinusitis, likely viral. Consider bacterial if symptoms persist or worsen. - Recommended Flonase , saline nasal spray, DayQuil, Nyquil, steam showers, humidifier, warm compresses. - Advised against immediate antibiotics; prescribed Augmentin  if symptoms persist beyond 8-10 days or worsen. - Instructed to test for flu and COVID at pharmacy if symptoms persist. - Advised saline nasal spray/gel and Vaseline or Neosporin in nose at night to prevent dryness. - Encouraged rest and increased fluid intake. - Instructed to return for evaluation if no improvement even after starting antibiotics.     Follow-up if symptoms worsen or fail to improve.    I discussed the assessment and treatment plan with the patient. The patient was provided an opportunity to ask questions and all were answered. The patient agreed with the plan and demonstrated an understanding of the instructions.  The patient was advised to call back or seek an in-person evaluation if the symptoms worsen or if the condition fails to improve as anticipated.   I,Emily Lagle,acting as a neurosurgeon for Waddell KATHEE Mon, NP.,have documented all relevant documentation on the behalf of Waddell KATHEE Mon, NP.  I, Waddell KATHEE Mon, NP, have reviewed all  documentation for this visit. The documentation on 02/06/2024 for the exam, diagnosis, procedures, and orders are all accurate and complete.

## 2024-02-06 NOTE — Progress Notes (Signed)
" ° °  Acute Telehealth Visit  Subjective:     Patient ID: Melanie Cruz, female    DOB: 1995/08/20, 29 y.o.   MRN: [id number]  Chief Complaint  Patient presents with   Sinus Problem   Sinus Problem Associated symptoms include congestion and a sore throat.   Patient presents virtually today for sinus problems. Patient reports 4 days ago she noticed her nose was very dry seeing some blood in the tissue when blowing approximately 3x. Patient reports thick clear mucous and sore throat. Reports pain described as discomfort 8/10. Feels soreness along jaw line. She has taken nothing for her symptoms. Patient unsure if she needs antibiotic or not and with upcoming weather is concerned about access if waiting longer to be seen.   Review of Systems  Constitutional: Negative.   HENT:  Positive for congestion, sinus pain and sore throat.   Eyes: Negative.   Respiratory: Negative.    Cardiovascular: Negative.   Gastrointestinal: Negative.       Objective:    Patient does not have access to any vital measurements equipment  Physical Exam Constitutional:      Appearance: Normal appearance.  HENT:     Head: Normocephalic.  Pulmonary:     Effort: Pulmonary effort is normal.  Neurological:     Mental Status: She is alert and oriented to person, place, and time.       Assessment & Plan:   Nasal Congestion -OTC symptomatic management (list provided to patient) -Augmentin  rx (instructed patient to watch and wait and only to use if symptoms not improving after 7 days     Sore throat -OTC symptomatic management   Follow up as needed if new or worsening symptoms  Levon Budd, FNP-student   "

## 2024-02-06 NOTE — Patient Instructions (Signed)
 Likely a viral upper respiratory infection  Continue supportive measures including rest, hydration, humidifier use, steam showers, warm compresses to sinuses, warm liquids with lemon and honey, and over-the-counter cough, cold, and analgesics as needed. If symptoms persist 8-10 days, become severe, or return after a few days of feeling better, then please follow-up for repeat evaluation to determine if antibiotics may be necessary.  Over the counter medications that may be helpful for symptoms:  Guaifenesin 1200 mg extended release tabs twice daily, with plenty of water For cough and congestion Brand name: Mucinex   Pseudoephedrine 30 mg, one or two tabs every 4 to 6 hours For sinus congestion Brand name: Sudafed You must get this from the pharmacy counter.  Oxymetazoline nasal spray each morning, one spray in each nostril, for NO MORE THAN 3 days  For nasal and sinus congestion Brand name: Afrin Saline nasal spray or Saline Nasal Irrigation (Netti Pot, etc) 3-5 times a day For nasal and sinus congestion Brand names: Ocean or AYR Fluticasone nasal spray OR Mometasone nasal spray OR Triamcinolone Acetonide nasal spray - follow directions on the packaging For nasal and sinus congestion Brand name: Flonase, Nasonex, Nasacort Warm salt water gargles  For sore throat Every few hours as needed Alternate ibuprofen 400-600 mg and acetaminophen 1000 mg every 6 hours For fever, body aches, headache Brand names: Motrin or Advil and Tylenol Dextromethorphan 12-hour cough version 30 mg every 12 hours  For cough Brand name: Delsym Stop all other cold medications for now (Nyquil, Dayquil, Tylenol Cold, Theraflu, etc) and other non-prescription cough/cold preparations. Many of these have the same ingredients listed above and could cause an overdose of medication.   Herbal treatments that have been shown to be helpful in some patients include: Vitamin C 1000 mg per day Zinc 100 mg per  day Quercetin 25-500 mg twice a day Melatonin 5-10mg  at bedtime Honey Green Tea  General Instructions Allow your body to rest Drink PLENTY of fluids Typically, we are the most contagious 1-2 days before symptoms start through the first 2-3 days of most severe symptoms. Per CDC guidelines, you can return to school/work when symptoms have started to improve and you have been fever-free for 24 hours. However, recommend you continue extra precautions for the following 5 days (frequent hand hygiene, masking, covering coughs/sneezes, minimize exposure to immunocompromised individuals, etc).  If you develop severe shortness of breath, uncontrolled fevers, coughing up blood, confusion, chest pain, or signs of dehydration (such as significantly decreased urine amounts or dizziness with standing) please go to the nearest ER.

## 2024-02-11 ENCOUNTER — Ambulatory Visit: Admitting: Medical
